# Patient Record
Sex: Female | Born: 1955 | Race: Black or African American | Hispanic: No | Smoking: Never smoker
Health system: Southern US, Community
[De-identification: ages and names within clinical notes are randomized; demographics above are authoritative.]

## PROBLEM LIST (undated history)

## (undated) DIAGNOSIS — M8580 Other specified disorders of bone density and structure, unspecified site: Secondary | ICD-10-CM

## (undated) DIAGNOSIS — R7303 Prediabetes: Secondary | ICD-10-CM

## (undated) DIAGNOSIS — D509 Iron deficiency anemia, unspecified: Secondary | ICD-10-CM

## (undated) DIAGNOSIS — D649 Anemia, unspecified: Secondary | ICD-10-CM

## (undated) DIAGNOSIS — E7800 Pure hypercholesterolemia, unspecified: Secondary | ICD-10-CM

## (undated) DIAGNOSIS — E079 Disorder of thyroid, unspecified: Secondary | ICD-10-CM

## (undated) DIAGNOSIS — K219 Gastro-esophageal reflux disease without esophagitis: Secondary | ICD-10-CM

## (undated) DIAGNOSIS — J45909 Unspecified asthma, uncomplicated: Secondary | ICD-10-CM

## (undated) DIAGNOSIS — Z8719 Personal history of other diseases of the digestive system: Secondary | ICD-10-CM

## (undated) DIAGNOSIS — T7840XA Allergy, unspecified, initial encounter: Secondary | ICD-10-CM

## (undated) DIAGNOSIS — R29818 Other symptoms and signs involving the nervous system: Secondary | ICD-10-CM

## (undated) DIAGNOSIS — J189 Pneumonia, unspecified organism: Secondary | ICD-10-CM

## (undated) DIAGNOSIS — E039 Hypothyroidism, unspecified: Secondary | ICD-10-CM

## (undated) HISTORY — DX: Disorder of thyroid, unspecified: E07.9

## (undated) HISTORY — DX: Unspecified asthma, uncomplicated: J45.909

## (undated) HISTORY — PX: WISDOM TOOTH EXTRACTION: SHX21

## (undated) HISTORY — PX: TUBAL LIGATION: SHX77

## (undated) HISTORY — DX: Anemia, unspecified: D64.9

## (undated) HISTORY — DX: Allergy, unspecified, initial encounter: T78.40XA

---
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
## 1980-10-28 HISTORY — PX: THYROIDECTOMY, PARTIAL: SHX18

## 1980-10-28 HISTORY — PX: PARATHYROIDECTOMY: SHX19

## 1980-10-28 HISTORY — PX: OTHER SURGICAL HISTORY: SHX169

## 2011-03-06 LAB — HM HEPATITIS C SCREENING LAB: HM HEPATITIS C SCREENING: NEGATIVE

## 2014-06-17 LAB — LIPID PANEL
Cholesterol: 256 — AB (ref 0–200)
HDL: 70 (ref 35–70)
LDL Cholesterol: 172
TRIGLYCERIDES: 72 (ref 40–160)

## 2014-06-17 LAB — TSH: TSH: 2.6 (ref 0.41–5.90)

## 2014-06-17 LAB — HEMOGLOBIN A1C: Hemoglobin A1C: 6.2

## 2014-09-29 LAB — HEMOGLOBIN A1C: HEMOGLOBIN A1C: 6.2

## 2014-09-29 LAB — LIPID PANEL
Cholesterol: 207 — AB (ref 0–200)
HDL: 59 (ref 35–70)
LDL Cholesterol: 133
Triglycerides: 73 (ref 40–160)

## 2014-09-29 LAB — CBC AND DIFFERENTIAL
HCT: 38 (ref 36–46)
HEMOGLOBIN: 12.8 (ref 12.0–16.0)
Platelets: 277 (ref 150–399)
WBC: 5.1

## 2014-09-29 LAB — TSH: TSH: 0.11 — AB (ref 0.41–5.90)

## 2014-09-29 LAB — HEPATIC FUNCTION PANEL
AST: 18 (ref 13–35)
Alkaline Phosphatase: 85 (ref 25–125)

## 2014-09-29 LAB — BASIC METABOLIC PANEL
BUN: 14 (ref 4–21)
CREATININE: 0.9 (ref 0.5–1.1)
GLUCOSE: 95
POTASSIUM: 4.7 (ref 3.4–5.3)
SODIUM: 140 (ref 137–147)

## 2014-09-29 LAB — COMPREHENSIVE METABOLIC PANEL
FREE T4: 1.4
URIC ACID: 5

## 2018-05-29 ENCOUNTER — Encounter: Payer: Self-pay | Admitting: Family Medicine

## 2018-05-29 ENCOUNTER — Ambulatory Visit: Payer: BLUE CROSS/BLUE SHIELD | Admitting: Family Medicine

## 2018-05-29 VITALS — BP 124/80 | HR 68 | Resp 16 | Ht 67.0 in | Wt 230.6 lb

## 2018-05-29 DIAGNOSIS — H9202 Otalgia, left ear: Secondary | ICD-10-CM

## 2018-05-29 DIAGNOSIS — E039 Hypothyroidism, unspecified: Secondary | ICD-10-CM

## 2018-05-29 DIAGNOSIS — H6123 Impacted cerumen, bilateral: Secondary | ICD-10-CM

## 2018-05-29 DIAGNOSIS — R29818 Other symptoms and signs involving the nervous system: Secondary | ICD-10-CM | POA: Insufficient documentation

## 2018-05-29 DIAGNOSIS — E669 Obesity, unspecified: Secondary | ICD-10-CM

## 2018-05-29 DIAGNOSIS — J302 Other seasonal allergic rhinitis: Secondary | ICD-10-CM

## 2018-05-29 DIAGNOSIS — R7309 Other abnormal glucose: Secondary | ICD-10-CM

## 2018-05-29 DIAGNOSIS — Z7689 Persons encountering health services in other specified circumstances: Secondary | ICD-10-CM | POA: Diagnosis not present

## 2018-05-29 DIAGNOSIS — K219 Gastro-esophageal reflux disease without esophagitis: Secondary | ICD-10-CM

## 2018-05-29 MED ORDER — MONTELUKAST SODIUM 10 MG PO TABS: 10.0000 mg | ORAL_TABLET | Freq: Every evening | ORAL | 3 refills | Status: DC | PRN

## 2018-05-29 MED ORDER — SYNTHROID 175 MCG PO TABS: 175.0000 ug | ORAL_TABLET | Freq: Every day | ORAL | 3 refills | Status: DC

## 2018-05-29 NOTE — Patient Instructions (Addendum)
Thank you for coming to the office today.  Referral to [facility] - stay tuned  Start back on Synthroid 175mcg daily - will re-check labs in 3 months  See if you can find records for routine screen Hep C and HIV - will need a copy or Let me know before blood draw and we can check this test as well  Will arrange colonoscopy in future.  You have thick impacted ear wax (cerumen) blocking ear canals and ear drums.  Try the over the counter Debrox (Carbamide peroxide), use on both sides following instructions on bottle   Can use a bulb syringe or something similar to flush wax out  Avoid using Q-tips inside ears, as this can push wax deeper, but you can try to use rolled up kleenex as a wick to absorb fluid and wax as well.  If you are not making progress with ear wax removal at home, or the problem keeps coming back, please notify our office or return for re-evaluation, and we can discuss referral to ENT office for more formal ear wax removal.   DUE for FASTING BLOOD WORK (no food or drink after midnight before the lab appointment, only water or coffee without cream/sugar on the morning of)  SCHEDULE "Lab Only" visit in the morning at the clinic for lab draw in 3 MONTHS   - Make sure Lab Only appointment is at about 1 week before your next appointment, so that results will be available  For Lab Results, once available within 2-3 days of blood draw, you can can log in to MyChart online to view your results and a brief explanation. Also, we can discuss results at next follow-up visit.   Please schedule a Follow-up Appointment to: Return in about 3 months (around 08/29/2018) for Annual Physical.  If you have any other questions or concerns, please feel free to call the office or send a message through MyChart. You may also schedule an earlier appointment if necessary.  Additionally, you may be receiving a survey about your experience at our office within a few days to 1 week  by e-mail or mail. We value your feedback.  Saralyn PilarAlexander Ladarion Munyon, DO [facility]

## 2018-05-29 NOTE — Progress Notes (Addendum)
Subjective:    Patient ID: Amanda Pierce, female    DOB: May 21, 1956, 62 y.o.   MRN: [id number]  Ambree Pierce is a 62 y.o. female presenting on 05/29/2018 for Establish Care; Hypothyroidism (need refill and has been out of medication); and Ear Pain (left ear since dental work)  From [place], and moved to [facility] about 8 years ago. Previous PCP located in [place] apparently was incarcerated and their office was closed. Tried to locate any of her records from prior PCP, since they closed, but only has access to some lab results, no other records.  HPI   Hypothyroidism - History of thyroidectomy 07/1981 (after diagnosed nodules) was not started on thyroid hormone for 5 years after, dx with suppressed goiter, prior dosage adjustment in past, has been on Levothyroxine for while. Now over past 3-4 months has been out of medicine, only has very small # of pills left and taking intermittently, need refill and lab re-check eventually. Last TSH from records available is 2015.  GERD / Hiatal Hernia Reports chronic problem in past, with prior dx hiatal hernia on EGD. She was treated with PPI. Then transitioned to Pepcid has done well and ultimately this was healed  - Previously on pepcid, and now this is healed.  Dental Work Recently had a crown placed, that needed to be revised. She had some pain from this seemed to radiate to her L ear asking to get this checked out today.  Suspected Sleep Apnea Reports concern for sleep apnea with history of habitual snoring and feeling tired and sleepiness during daytime. Also witnessed sleep apnea by her report. She has never had PSG before. She admits symptoms worse with some weight gain. See scores below.  Epworth Sleepiness Scale Total Score: 13 Sitting and reading - 2 Watching TV - 3 Sitting inactive in a public place - 1 As a passenger in a car for an hour without a break - 3 Lying down to rest in the afternoon when circumstances permit -  2 Sitting and talking to someone - 1 Sitting quietly after a lunch without alcohol - 1 In a car, while stopped for a few minutes in traffic - 0  STOP-Bang OSA scoring Snoring yes   Tiredness yes   Observed apneas yes   Pressure HTN no   BMI > 35 kg/m2 yes   Age > 50  yes   Neck (female >17 in; Female >16 in)  no  15.5"  Gender female no   OSA risk low (0-2)  OSA risk intermediate (3-4)  OSA risk high (5+)  Total: 5 (High Risk)   Additional social history: - She works as a Merchant navy officer. Her husband is a Programmer, multimedia, husband is retired Dealer She has 4 children, 2 grand, and 3 great grand - Enjoys crafts, hobbies  Health Maintenance:  Colon CA Screening: Last Colon[facility]  ([facility] results with negative without polyps, good for 5 years, this was her 3rd colonoscopy, do not have access to the record. Currently asymptomatic except some chronic mild constipation. No known family history of colon CA. Due for screening test in 08/2018 will need new referral to GI for colonoscopy   Husband has Hepatitis C, chronic - treated at [place]. He was cured after 2 attempts of medication. She has been tested before and was negative, will request record   Depression screen [facility] 2/9 05/29/2018  Decreased Interest 0  Down, Depressed, Hopeless 0  PHQ - 2  Score 0    Past Medical History:  Diagnosis Date  . Allergy   . Anemia   . Asthma    History reviewed. No pertinent surgical history. Social History   Socioeconomic History  . Marital status: Married    Spouse name: Not on file  . Number of children: Not on file  . Years of education: Not on file  . Highest education level: Not on file  Occupational History  . Not on file  Social Needs  . Financial resource strain: Not on file  . Food insecurity:    Worry: Not on file    Inability: Not on file  . Transportation needs:    Medical: Not on file    Non-medical: Not on file  Tobacco Use  . Smoking status:  Never Smoker  . Smokeless tobacco: Never Used  Substance and Sexual Activity  . Alcohol use: Never    Frequency: Never  . Drug use: Never  . Sexual activity: Not on file  Lifestyle  . Physical activity:    Days per week: Not on file    Minutes per session: Not on file  . Stress: Not on file  Relationships  . Social connections:    Talks on phone: Not on file    Gets together: Not on file    Attends religious service: Not on file    Active member of club or organization: Not on file    Attends meetings of clubs or organizations: Not on file    Relationship status: Not on file  . Intimate partner violence:    Fear of current or ex partner: Not on file    Emotionally abused: Not on file    Physically abused: Not on file    Forced sexual activity: Not on file  Other Topics Concern  . Not on file  Social History Narrative  . Not on file   Family History  Problem Relation Age of Onset  . Cancer Mother        Breast, Paget's removed nipple 72, breast 2001  . Heart disease Father   . Colon cancer Neg Hx    No current outpatient medications on file prior to visit.   No current facility-administered medications on file prior to visit.     Review of Systems Per HPI unless specifically indicated above     Objective:    BP 124/80 (BP Location: Left Arm, Patient Position: Sitting, Cuff Size: Large)   Pulse 68   Resp 16   Ht 5\' 7"  (1.702 m)   Wt 230 lb 9.6 oz (104.6 kg)   BMI 36.12 kg/m   Wt Readings from Last 3 Encounters:  05/29/18 230 lb 9.6 oz (104.6 kg)    Physical Exam  Constitutional: She is oriented to person, place, and time. She appears well-developed and well-nourished. No distress.  Well-appearing, comfortable, cooperative, obese  HENT:  Head: Normocephalic and atraumatic.  Mouth/Throat: Oropharynx is clear and moist.  Frontal / maxillary sinuses non-tender. Bilateral ears with moderate amount of soft non impacted cerumen - but TMs visible without  erythema, effusion or bulging. No tragus tender. No mastoid tender. Oropharynx clear without erythema, exudates, edema or asymmetry.  Eyes: Conjunctivae are normal. Right eye exhibits no discharge. Left eye exhibits no discharge.  Neck: Normal range of motion. Neck supple. No thyromegaly present.  No thyroid abnormality or nodules  Cardiovascular: Normal rate, regular rhythm, normal heart sounds and intact distal pulses.  No murmur heard. Pulmonary/Chest: Effort normal  and breath sounds normal. No respiratory distress. She has no wheezes. She has no rales.  Musculoskeletal: Normal range of motion. She exhibits no edema.  Lymphadenopathy:    She has no cervical adenopathy.  Neurological: She is alert and oriented to person, place, and time.  Skin: Skin is warm and dry. No rash noted. She is not diaphoretic. No erythema.  Psychiatric: She has a normal mood and affect. Her behavior is normal.  Well groomed, good eye contact, normal speech and thoughts  Nursing note and vitals reviewed.  Results for orders placed or performed in visit on 05/29/18  CBC and differential  Result Value Ref Range   Hemoglobin 12.8 12.0 - 16.0   HCT 38 36 - 46   Platelets 277 150 - 399   WBC 5.1   Basic metabolic panel  Result Value Ref Range   Glucose 95    BUN 14 4 - 21   Creatinine 0.9 0.5 - 1.1   Potassium 4.7 3.4 - 5.3   Sodium 140 137 - 147  Lipid panel  Result Value Ref Range   Triglycerides 73 40 - 160   Cholesterol 207 (A) 0 - 200   HDL 59 35 - 70   LDL Cholesterol 133   Hepatic function panel  Result Value Ref Range   Alkaline Phosphatase 85 25 - 125   AST 18 13 - 35  Hemoglobin A1c  Result Value Ref Range   Hemoglobin A1C 6.2   TSH  Result Value Ref Range   TSH 0.11 (A) 0.41 - 5.90  Comprehensive metabolic panel  Result Value Ref Range   Free T4 1.4    Uric Acid 5   Lipid panel  Result Value Ref Range   Triglycerides 72 40 - 160   Cholesterol 256 (A) 0 - 200   HDL 70 35 - 70    LDL Cholesterol 172   Hemoglobin A1c  Result Value Ref Range   Hemoglobin A1C 6.2   TSH  Result Value Ref Range   TSH 2.60 0.41 - 5.90      Assessment & Plan:   Problem List Items Addressed This Visit    Elevated hemoglobin A1c    Prior readings elevated 6.2 in 2015 Due for repeat with upcoming labs      Gastroesophageal reflux disease without esophagitis    Controlled with lifestyle modification Off PPI or H2 currently Prior history of hiatal hernia and worse problem Follow-up if worse      Hypothyroidism (acquired) - Primary    Chronic issue post-surgical thyroidectomy due to nodules/goiter Previously on Synthroid brand 175mcg daily - ran out and due for refill and future re-check lab - Old labs received dated soonest was 2015      Relevant Medications   SYNTHROID 175 MCG tablet   Obesity (BMI 35.0-39.9 without comorbidity)    Encourage lifestyle modification      Suspected sleep apnea    Persistent clinical concern for suspected obstructive sleep apnea given reported symptoms with witnessed apnea, snoring and sleep disturbance, tired, excessive sleepiness. - Screening: ESS score 13 / STOP-Bang Score 5 = high risk - Neck Circumference: 15.5"  Plan: 1. Discussion on initial diagnosis and testing for OSA, risk factors, management, complications 2. Agree to proceed with sleep study testing based on clinical concerns - advised patient that [facility] and they will contact her to arrange home vs sleep center and schedule  Other Visit Diagnoses    Encounter to establish care with new doctor       Left ear pain       Bilateral impacted cerumen       Seasonal allergies       Relevant Medications   montelukast (SINGULAIR) 10 MG tablet      Meds ordered this encounter  Medications  . SYNTHROID 175 MCG tablet    Sig: Take 1 tablet (175 mcg total) by mouth daily before breakfast.    Dispense:  100 tablet    Refill:  3  .  montelukast (SINGULAIR) 10 MG tablet    Sig: Take 1 tablet (10 mg total) by mouth at bedtime as needed.    Dispense:  90 tablet    Refill:  3    Follow up plan: Return in about 3 months (around 08/29/2018) for Annual Physical.  Future lab orders 08/24/18  Saralyn Pilar, DO [facility] 05/30/2018, 10:57 AM

## 2018-05-30 ENCOUNTER — Other Ambulatory Visit: Payer: Self-pay | Admitting: Family Medicine

## 2018-05-30 ENCOUNTER — Encounter: Payer: Self-pay | Admitting: Family Medicine

## 2018-05-30 DIAGNOSIS — K219 Gastro-esophageal reflux disease without esophagitis: Secondary | ICD-10-CM

## 2018-05-30 DIAGNOSIS — E7800 Pure hypercholesterolemia, unspecified: Secondary | ICD-10-CM

## 2018-05-30 DIAGNOSIS — Z0000 Encounter for general adult medical examination without abnormal findings: Secondary | ICD-10-CM

## 2018-05-30 DIAGNOSIS — E039 Hypothyroidism, unspecified: Secondary | ICD-10-CM

## 2018-05-30 DIAGNOSIS — E669 Obesity, unspecified: Secondary | ICD-10-CM

## 2018-05-30 DIAGNOSIS — R29818 Other symptoms and signs involving the nervous system: Secondary | ICD-10-CM

## 2018-05-30 DIAGNOSIS — R7309 Other abnormal glucose: Secondary | ICD-10-CM

## 2018-05-30 DIAGNOSIS — R7303 Prediabetes: Secondary | ICD-10-CM | POA: Insufficient documentation

## 2018-05-30 NOTE — Assessment & Plan Note (Signed)
Prior readings elevated 6.2 in 2015 Due for repeat with upcoming labs

## 2018-05-30 NOTE — Assessment & Plan Note (Signed)
Persistent clinical concern for suspected obstructive sleep apnea given reported symptoms with witnessed apnea, snoring and sleep disturbance, tired, excessive sleepiness. - Screening: ESS score 13 / STOP-Bang Score 5 = high risk - Neck Circumference: 15.5"  Plan: 1. Discussion on initial diagnosis and testing for OSA, risk factors, management, complications 2. Agree to proceed with sleep study testing based on clinical concerns - advised patient that we will [facility] and they will contact her to arrange home vs sleep center and schedule

## 2018-05-30 NOTE — Assessment & Plan Note (Signed)
Controlled with lifestyle modification Off PPI or H2 currently Prior history of hiatal hernia and worse problem Follow-up if worse

## 2018-05-30 NOTE — Assessment & Plan Note (Signed)
Encourage life style modification

## 2018-05-30 NOTE — Assessment & Plan Note (Signed)
Chronic issue post-surgical thyroidectomy due to nodules/goiter Previously on Synthroid brand 175mcg daily - ran out and due for refill and future re-check lab - Old labs received dated soonest was [id number]

## 2018-05-31 ENCOUNTER — Encounter: Payer: Self-pay | Admitting: Family Medicine

## 2018-06-05 ENCOUNTER — Encounter: Payer: Self-pay | Admitting: Family Medicine

## 2018-08-24 ENCOUNTER — Other Ambulatory Visit: Payer: BLUE CROSS/BLUE SHIELD

## 2018-08-24 DIAGNOSIS — E669 Obesity, unspecified: Secondary | ICD-10-CM

## 2018-08-24 DIAGNOSIS — K219 Gastro-esophageal reflux disease without esophagitis: Secondary | ICD-10-CM

## 2018-08-24 DIAGNOSIS — E7800 Pure hypercholesterolemia, unspecified: Secondary | ICD-10-CM

## 2018-08-24 DIAGNOSIS — R7309 Other abnormal glucose: Secondary | ICD-10-CM

## 2018-08-24 DIAGNOSIS — Z0000 Encounter for general adult medical examination without abnormal findings: Secondary | ICD-10-CM

## 2018-08-24 DIAGNOSIS — E039 Hypothyroidism, unspecified: Secondary | ICD-10-CM

## 2018-08-25 LAB — COMPLETE METABOLIC PANEL WITH GFR
AG RATIO: 1.2 (calc) (ref 1.0–2.5)
ALKALINE PHOSPHATASE (APISO): 81 U/L (ref 33–130)
ALT: 9 U/L (ref 6–29)
AST: 17 U/L (ref 10–35)
Albumin: 3.7 g/dL (ref 3.6–5.1)
BILIRUBIN TOTAL: 0.4 mg/dL (ref 0.2–1.2)
BUN: 12 mg/dL (ref 7–25)
CHLORIDE: 107 mmol/L (ref 98–110)
CO2: 32 mmol/L (ref 20–32)
Calcium: 9 mg/dL (ref 8.6–10.4)
Creat: 0.81 mg/dL (ref 0.50–0.99)
GFR, EST NON AFRICAN AMERICAN: 77 mL/min/{1.73_m2} (ref >=60)
GFR, Est African American: 89 mL/min/{1.73_m2} (ref >=60)
GLOBULIN: 3.2 g/dL (ref 1.9–3.7)
Glucose, Bld: 105 mg/dL — ABNORMAL HIGH (ref 65–99)
Potassium: 4.2 mmol/L (ref 3.5–5.3)
SODIUM: 143 mmol/L (ref 135–146)
Total Protein: 6.9 g/dL (ref 6.1–8.1)

## 2018-08-25 LAB — CBC WITH DIFFERENTIAL/PLATELET
BASOS PCT: 0.2 %
Basophils Absolute: 11 cells/uL (ref 0–200)
EOS ABS: 39 {cells}/uL (ref 15–500)
Eosinophils Relative: 0.7 %
HEMATOCRIT: 37.6 % (ref 35.0–45.0)
HEMOGLOBIN: 11.7 g/dL (ref 11.7–15.5)
LYMPHS ABS: 2156 {cells}/uL (ref 850–3900)
MCH: 25.2 pg — AB (ref 27.0–33.0)
MCHC: 31.1 g/dL — ABNORMAL LOW (ref 32.0–36.0)
MCV: 80.9 fL (ref 80.0–100.0)
MPV: 10.5 fL (ref 7.5–12.5)
Monocytes Relative: 6.8 %
NEUTROS ABS: 3013 {cells}/uL (ref 1500–7800)
Neutrophils Relative %: 53.8 %
PLATELETS: 313 10*3/uL (ref 140–400)
RBC: 4.65 10*6/uL (ref 3.80–5.10)
RDW: 14.8 % (ref 11.0–15.0)
TOTAL LYMPHOCYTE: 38.5 %
WBC: 5.6 10*3/uL (ref 3.8–10.8)
WBCMIX: 381 {cells}/uL (ref 200–950)

## 2018-08-25 LAB — LIPID PANEL
CHOL/HDL RATIO: 3.8 (calc) (ref <5.0)
CHOLESTEROL: 210 mg/dL — AB (ref <200)
HDL: 55 mg/dL (ref >50)
LDL CHOLESTEROL (CALC): 137 mg/dL — AB
Non-HDL Cholesterol (Calc): 155 mg/dL (calc) — ABNORMAL HIGH (ref <130)
Triglycerides: 84 mg/dL (ref <150)

## 2018-08-25 LAB — TSH: TSH: 0.16 mIU/L — ABNORMAL LOW (ref 0.40–4.50)

## 2018-08-25 LAB — HEMOGLOBIN A1C
HEMOGLOBIN A1C: 6.3 %{Hb} — AB (ref <5.7)
MEAN PLASMA GLUCOSE: 134 (calc)
eAG (mmol/L): 7.4 (calc)

## 2018-08-25 LAB — T4, FREE: Free T4: 1.2 ng/dL (ref 0.8–1.8)

## 2018-08-26 ENCOUNTER — Encounter: Payer: Self-pay | Admitting: Family Medicine

## 2018-08-28 ENCOUNTER — Encounter: Payer: Self-pay | Admitting: Family Medicine

## 2018-08-28 ENCOUNTER — Other Ambulatory Visit: Payer: Self-pay | Admitting: Family Medicine

## 2018-08-28 ENCOUNTER — Ambulatory Visit (INDEPENDENT_AMBULATORY_CARE_PROVIDER_SITE_OTHER): Payer: BLUE CROSS/BLUE SHIELD | Admitting: Family Medicine

## 2018-08-28 VITALS — BP 134/80 | HR 64 | Temp 98.3°F | Resp 16 | Ht 68.0 in | Wt 226.0 lb

## 2018-08-28 DIAGNOSIS — K219 Gastro-esophageal reflux disease without esophagitis: Secondary | ICD-10-CM

## 2018-08-28 DIAGNOSIS — Z23 Encounter for immunization: Secondary | ICD-10-CM

## 2018-08-28 DIAGNOSIS — Z0000 Encounter for general adult medical examination without abnormal findings: Secondary | ICD-10-CM | POA: Diagnosis not present

## 2018-08-28 DIAGNOSIS — E669 Obesity, unspecified: Secondary | ICD-10-CM

## 2018-08-28 DIAGNOSIS — R29818 Other symptoms and signs involving the nervous system: Secondary | ICD-10-CM

## 2018-08-28 DIAGNOSIS — K449 Diaphragmatic hernia without obstruction or gangrene: Secondary | ICD-10-CM

## 2018-08-28 DIAGNOSIS — E039 Hypothyroidism, unspecified: Secondary | ICD-10-CM | POA: Diagnosis not present

## 2018-08-28 DIAGNOSIS — E559 Vitamin D deficiency, unspecified: Secondary | ICD-10-CM

## 2018-08-28 DIAGNOSIS — E7800 Pure hypercholesterolemia, unspecified: Secondary | ICD-10-CM | POA: Diagnosis not present

## 2018-08-28 DIAGNOSIS — Z1211 Encounter for screening for malignant neoplasm of colon: Secondary | ICD-10-CM

## 2018-08-28 DIAGNOSIS — R7303 Prediabetes: Secondary | ICD-10-CM

## 2018-08-28 DIAGNOSIS — E538 Deficiency of other specified B group vitamins: Secondary | ICD-10-CM

## 2018-08-28 DIAGNOSIS — H6123 Impacted cerumen, bilateral: Secondary | ICD-10-CM

## 2018-08-28 DIAGNOSIS — D509 Iron deficiency anemia, unspecified: Secondary | ICD-10-CM

## 2018-08-28 MED ORDER — SYNTHROID 175 MCG PO TABS: 175.0000 ug | ORAL_TABLET | Freq: Every day | ORAL | 5 refills | Status: DC

## 2018-08-28 NOTE — Assessment & Plan Note (Signed)
Controlled with lifestyle modification Off PPI or H2 currently Prior history of hiatal hernia and worse problem  Referral to [facility] for routine screening colonoscopy - also patient requesting repeat endoscopy, as advised by GI for surveillance

## 2018-08-28 NOTE — Progress Notes (Signed)
Subjective:    Patient ID: Amanda Pierce, female    DOB: 08-27-1956, 62 y.o.   MRN: [id number]  Amanda Pierce is a 62 y.o. female presenting on 08/28/2018 for Annual Exam   HPI   Here for Annual Physical and Lab Review.  FOLLOW-UP Hypothyroidism - Last visit with me 05/29/18, for initial visit, treated with restarted prior Synthroid dose of levothyroxine 147mg daily as she had been off for past 3-4 months, see prior notes for background information. - Update - labs show slightly low TSH 0.16, normal Free T4 - Today patient reports she feels fine back on the medicine. Taking daily, not missed any doses. - She has no new complaints of symptoms at this time, does not feel hyperthyroid, she request to stay on same dose has been on this for years - Asking for 30 day supply due to cost  History of Anemia Long history of prior anemia, reported in past with required iron treatment. No prior labs with anemia, however he recent lab shows Hgb 11.7 borderline low. She also has history of Vitamin B12 deficiency, in past she received B12 injections for energy. - Admits feels a little tired  HYPERLIPIDEMIA: - Reports no concerns. Last lipid panel 07/2018, mild elevated LDL Not on statin  Additional question - She has some spots of vitiligo on extremities, small areas of hypopigmentation, asking about testing for Lupus. She states fam member diagnosed w/ lupus in their 30-40s.  PreDM Prior A1c 6.2 few years ago. Now recent lab 6.3 Meds: not on  medication Lifestyle: - Diet (balanced, admits needs to improve diet, interested in low carb)  Denies hypoglycemia  Bilateral Cerumen Ear Impaction Previous visit she had similar complaint with thick ear wax. She used OTC debrox drops but unable to resolve the cerumen. Now requesting flushing. Admits some pressure within ears but without pain or hearing loss.  GERD / Hiatal Hernia Reports chronic problem in past, with prior dx hiatal hernia on EGD. She  was treated with PPI. Then transitioned to Pepcid has done well and ultimately this was healed - She is asking about referral to GI for repeat updated colonoscopy / endoscopy  Suspected Sleep Apnea Last visit PSG was ordered after discussion of concern for OSA. See prior note - brief update today is that she cannot afford it because her deductible has not been met, she will need to attempt to contact them to re run the test in 2020  Below are copied scores from her screening at last visit Epworth Sleepiness Scale Total Score: 13 Sitting and reading - 2 Watching TV - 3 Sitting inactive in a public place - 1 As a passenger in a car for an hour without a break - 3 Lying down to rest in the afternoon when circumstances permit - 2 Sitting and talking to someone - 1 Sitting quietly after a lunch without alcohol - 1 In a car, while stopped for a few minutes in traffic - 0  STOP-Bang OSA scoring Snoring yes   Tiredness yes   Observed apneas yes   Pressure HTN no   BMI > 35 kg/m2 yes   Age > 567 yes   Neck (female >17 in; Female >16 in)  no  15.5"  Gender female no   OSA risk low (0-2)  OSA risk intermediate (3-4)  OSA risk high (5+)  Total: 5 (High Risk)    Health Maintenance:  Tdap today  [facility]- awaiting  results Mammo 07/2018 / DEXA - she will have them fax [place] results.  Colon CA Screening: Last Colonoscopy  (done [facility]), results with negative without polyps, good for 5 years, this was her 3rd colonoscopy, do not have access to the record. Currently asymptomatic except some chronic mild constipation. No known family history of colon CA. Due for screening test in 08/2018 will need new referral to GI for colonoscopy   Husband has Hepatitis C, chronic - treated at [place]. He was cured after 2 attempts of medication. She has been tested before and was negative, will request record   Depression screen [facility] 2/9 08/28/2018 05/29/2018    Decreased Interest 0 0  Down, Depressed, Hopeless 0 0  PHQ - 2 Score 0 0    Past Medical History:  Diagnosis Date  . Allergy   . Anemia   . Asthma    Past Surgical History:  Procedure Laterality Date  . BREAST MASS EXCISION Left 1974   benign  . PARATHYROIDECTOMY  1982   Social History   Socioeconomic History  . Marital status: Married    Spouse name: Not on file  . Number of children: Not on file  . Years of education: Not on file  . Highest education level: Not on file  Occupational History  . Not on file  Social Needs  . Financial resource strain: Not on file  . Food insecurity:    Worry: Not on file    Inability: Not on file  . Transportation needs:    Medical: Not on file    Non-medical: Not on file  Tobacco Use  . Smoking status: Never Smoker  . Smokeless tobacco: Never Used  Substance and Sexual Activity  . Alcohol use: Never    Frequency: Never  . Drug use: Never  . Sexual activity: Not on file  Lifestyle  . Physical activity:    Days per week: Not on file    Minutes per session: Not on file  . Stress: Not on file  Relationships  . Social connections:    Talks on phone: Not on file    Gets together: Not on file    Attends religious service: Not on file    Active member of club or organization: Not on file    Attends meetings of clubs or organizations: Not on file    Relationship status: Not on file  . Intimate partner violence:    Fear of current or ex partner: Not on file    Emotionally abused: Not on file    Physically abused: Not on file    Forced sexual activity: Not on file  Other Topics Concern  . Not on file  Social History Narrative  . Not on file   Family History  Problem Relation Age of Onset  . Cancer Mother        Breast, Paget's removed nipple 32, breast 2001  . Colon polyps Mother   . Heart disease Father   . Heart attack Father   . Heart disease Maternal Grandfather   . Stroke Paternal Grandmother   . Colon cancer Neg  Hx    Current Outpatient Medications on File Prior to Visit  Medication Sig  . montelukast (SINGULAIR) 10 MG tablet Take 1 tablet (10 mg total) by mouth at bedtime as needed.   No current facility-administered medications on file prior to visit.     Review of Systems  Constitutional: Negative for activity change, appetite change,  chills, diaphoresis, fatigue and fever.  HENT: Negative for congestion and hearing loss.        Ear wax  Eyes: Negative for visual disturbance.  Respiratory: Negative for apnea, cough, choking, chest tightness, shortness of breath and wheezing.   Cardiovascular: Negative for chest pain, palpitations and leg swelling.  Gastrointestinal: Negative for abdominal pain, anal bleeding, blood in stool, constipation, diarrhea, nausea and vomiting.  Endocrine: Negative for cold intolerance.  Genitourinary: Negative for difficulty urinating, dysuria, frequency and hematuria.  Musculoskeletal: Negative for arthralgias, back pain and neck pain.  Skin: Negative for rash.  Allergic/Immunologic: Negative for environmental allergies.  Neurological: Negative for dizziness, weakness, light-headedness, numbness and headaches.  Hematological: Negative for adenopathy.  Psychiatric/Behavioral: Negative for behavioral problems, dysphoric mood and sleep disturbance. The patient is not nervous/anxious.    Per HPI unless specifically indicated above     Objective:    BP 134/80 (BP Location: Left Arm, Cuff Size: Normal)   Pulse 64   Temp 98.3 F (36.8 C) (Oral)   Resp 16   Ht 5' 8"  (1.727 m)   Wt 226 lb (102.5 kg)   BMI 34.36 kg/m   Wt Readings from Last 3 Encounters:  08/28/18 226 lb (102.5 kg)  05/29/18 230 lb 9.6 oz (104.6 kg)    Physical Exam  Constitutional: She is oriented to person, place, and time. She appears well-developed and well-nourished. No distress.  Well-appearing, comfortable, cooperative  HENT:  Head: Normocephalic and atraumatic.  Mouth/Throat:  Oropharynx is clear and moist.  Frontal / maxillary sinuses non-tender. Nares patent without purulence or edema. Oropharynx clear without erythema, exudates, edema or asymmetry.  Oral cavity with roof of mouth L side of upper soft palate with 1 cm soft raised smooth nodular mass, without erythema or ulceration. Stable known problem from her dentist.  Bilateral ears with significant cerumen impaction.  Eyes: Pupils are equal, round, and reactive to light. Conjunctivae and EOM are normal. Right eye exhibits no discharge. Left eye exhibits no discharge.  Neck: Normal range of motion. Neck supple. No thyromegaly present.  Cardiovascular: Normal rate, regular rhythm, normal heart sounds and intact distal pulses.  No murmur heard. Pulmonary/Chest: Effort normal and breath sounds normal. No respiratory distress. She has no wheezes. She has no rales.  Abdominal: Soft. Bowel sounds are normal. She exhibits no distension and no mass. There is no tenderness.  Musculoskeletal: Normal range of motion. She exhibits no edema or tenderness.  Upper / Lower Extremities: - Normal muscle tone, strength bilateral upper extremities 5/5, lower extremities 5/5  Lymphadenopathy:    She has no cervical adenopathy.  Neurological: She is alert and oriented to person, place, and time.  Distal sensation intact to light touch all extremities  Skin: Skin is warm and dry. No rash noted. She is not diaphoretic. No erythema.  Psychiatric: She has a normal mood and affect. Her behavior is normal.  Well groomed, good eye contact, normal speech and thoughts  Nursing note and vitals reviewed.    ________________________________________________________ PROCEDURE NOTE Date: 08/28/18 Bilateral Ear Lavage / Cerumen Removal Discussed benefits and risks (including pain / discomforts, dizziness, minor abrasion of ear canal). Verbal consent given by patient. Medication:  carbamide peroxide ear drops, Ear Lavage Solution (warm water  + hydrogen peroxide) Performed by Dr Parks Ranger / Frederich Cha CMA Several drops of carbamide peroxide placed in each ear, allowed to sit for few minutes. Ear lavage solution flushed into one ear at a time in attempt to dislodge and remove  ear wax. Results were successful eventually after lavage and manual curette removal. She had episode of dizziness during procedure, rested in office and it resolved.  Repeat Ear Exam: - Completely removed cerumen now, with clear ear canals and visible TMs clear and normal appearance.   Results for orders placed or performed in visit on 08/24/18  T4, free  Result Value Ref Range   Free T4 1.2 0.8 - 1.8 ng/dL  TSH  Result Value Ref Range   TSH 0.16 (L) 0.40 - 4.50 mIU/L  Lipid panel  Result Value Ref Range   Cholesterol 210 (H) <200 mg/dL   HDL 55 >50 mg/dL   Triglycerides 84 <150 mg/dL   LDL Cholesterol (Calc) 137 (H) mg/dL (calc)   Total CHOL/HDL Ratio 3.8 <5.0 (calc)   Non-HDL Cholesterol (Calc) 155 (H) <130 mg/dL (calc)  COMPLETE METABOLIC PANEL WITH GFR  Result Value Ref Range   Glucose, Bld 105 (H) 65 - 99 mg/dL   BUN 12 7 - 25 mg/dL   Creat 0.81 0.50 - 0.99 mg/dL   GFR, Est Non African American 77 > OR = 60 mL/min/1.21m   GFR, Est African American 89 > OR = 60 mL/min/1.756m  BUN/Creatinine Ratio NOT APPLICABLE 6 - 22 (calc)   Sodium 143 135 - 146 mmol/L   Potassium 4.2 3.5 - 5.3 mmol/L   Chloride 107 98 - 110 mmol/L   CO2 32 20 - 32 mmol/L   Calcium 9.0 8.6 - 10.4 mg/dL   Total Protein 6.9 6.1 - 8.1 g/dL   Albumin 3.7 3.6 - 5.1 g/dL   Globulin 3.2 1.9 - 3.7 g/dL (calc)   AG Ratio 1.2 1.0 - 2.5 (calc)   Total Bilirubin 0.4 0.2 - 1.2 mg/dL   Alkaline phosphatase (APISO) 81 33 - 130 U/L   AST 17 10 - 35 U/L   ALT 9 6 - 29 U/L  CBC with Differential/Platelet  Result Value Ref Range   WBC 5.6 3.8 - 10.8 Thousand/uL   RBC 4.65 3.80 - 5.10 Million/uL   Hemoglobin 11.7 11.7 - 15.5 g/dL   HCT 37.6 35.0 - 45.0 %   MCV 80.9 80.0 -  100.0 fL   MCH 25.2 (L) 27.0 - 33.0 pg   MCHC 31.1 (L) 32.0 - 36.0 g/dL   RDW 14.8 11.0 - 15.0 %   Platelets 313 140 - 400 Thousand/uL   MPV 10.5 7.5 - 12.5 fL   Neutro Abs 3,013 1,500 - 7,800 cells/uL   Lymphs Abs 2,156 850 - 3,900 cells/uL   WBC mixed population 381 200 - 950 cells/uL   Eosinophils Absolute 39 15 - 500 cells/uL   Basophils Absolute 11 0 - 200 cells/uL   Neutrophils Relative % 53.8 %   Total Lymphocyte 38.5 %   Monocytes Relative 6.8 %   Eosinophils Relative 0.7 %   Basophils Relative 0.2 %  Hemoglobin A1c  Result Value Ref Range   Hgb A1c MFr Bld 6.3 (H) <5.7 % of total Hgb   Mean Plasma Glucose 134 (calc)   eAG (mmol/L) 7.4 (calc)      Assessment & Plan:   Problem List Items Addressed This Visit    Gastroesophageal reflux disease without esophagitis    Controlled with lifestyle modification Off PPI or H2 currently Prior history of hiatal hernia and worse problem  Referral to [facility] for routine screening colonoscopy - also patient requesting repeat endoscopy, as advised by GI for surveillance      Relevant Orders  Ambulatory referral to Gastroenterology   Hypothyroidism (acquired)    Stable currently with slightly low TSH but normal Free T4 back on Synthroid 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 Not on any therapy currently  2. Encourage improved lifestyle - low carb, low sugar diet, reduce portion size, continue improving regular exercise - alrdy has wt loss 3. Follow-up 6 months PreDM  A1c       Pure hypercholesterolemia    Slightly elevated LDL, on improving lifestyle, not on statin Calculated ASCVD 10 yr risk score 7%  Plan: Encourage improved lifestyle - low carb/cholesterol, reduce portion size, continue improving regular exercise Follow-up yearly lipid - reviewed statins, deferred for now not indicated       Suspected sleep apnea    Persistent clinical concern for suspected obstructive sleep apnea given reported symptoms with witnessed apnea, snoring and sleep disturbance, tired, excessive sleepiness. - Screening: ESS score 13 / STOP-Bang Score 5 = high risk - Neck Circumference: 15.5"  Proceed with patient contacting Sleep Center in 2020 due to deductible could not run PSG this year. Clinically same as before meets criteria high risk for OSA       Other Visit Diagnoses    Annual physical exam    -  Primary Updated Health Maintenance information - TDap, Refer to GI, request mammo / dexa from GYN Reviewed recent lab results with patient Encouraged improvement to lifestyle with diet and exercise - Goal of weight loss    Screening for colon cancer       Relevant Orders   Ambulatory referral to Gastroenterology   Hiatal hernia       Relevant Orders   Ambulatory referral to Gastroenterology   Need for diphtheria-tetanus-pertussis (Tdap) vaccine       Relevant Orders   Tdap vaccine greater than or equal to 7yo IM (Completed)   Impacted cerumen of both ears          Significant amount of large thick impacted cerumen bilaterally ears uncomplicated  Plan: 1. Successful office ear lavage cerumen removal today, re-evaluated with clear ear canals and normal TMs 2. Counseled on avoiding Q-tips and may use Kleenex as wick, use OTC Debrox as needed 3. Follow-up as needed   Meds ordered this encounter  Medications  . SYNTHROID 175 MCG tablet    Sig: Take 1 tablet (175 mcg total) by mouth daily before breakfast.    Dispense:  30 tablet    Refill:  5     Quantity reduced from 90 to 30    Follow up plan: Return in about 6 months (around 02/26/2019) for Lab results / Anemia / Thyroid / PreDM.  Future labs 02/23/19 -  A1c, CBC, Anemia panel, TSH Free T4, Vitamin B12, Vitamin D  ANobie Putnam DO [facility] 08/28/2018, 3:07 PM

## 2018-08-28 NOTE — Assessment & Plan Note (Signed)
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

## 2018-08-28 NOTE — Patient Instructions (Addendum)
Thank you for coming to the office today.  A1c 6.3 - and LDL cholesterol slightly elevated - try to improve diet as planned, and more home cooked meals, low carb options  We will check iron and labs again for Thyroid again in 6 months.  Stay tuned for apt from GI - discuss both colonoscopy and endoscopy  [facility]) [address] - [address] [place], [place] [address] Phone: [phone]  Call [facility] again in 2020 - once ready to proceed. Let us know if you need a new order.  Will also check Vitamin B12 and D in 6 months  May take Vitamin B12 daily  Start OTC Vitamin D3 2,000 iu daily for maintenance   DUE for FASTING BLOOD WORK (no food or drink after midnight before the lab appointment, only water or coffee without cream/sugar on the morning of)  SCHEDULE "Lab Only" visit in the morning at the clinic for lab draw in 6 MONTHS   - Make sure Lab Only appointment is at about 1 week before your next appointment, so that results will be available  For Lab Results, once available within 2-3 days of blood draw, you can can log in to MyChart online to view your results and a brief explanation. Also, we can discuss results at next follow-up visit.   Please schedule a Follow-up Appointment to: Return in about 6 months (around 02/26/2019) for Lab results / Anemia / Thyroid / PreDM.  If you have any other questions or concerns, please feel free to call the office or send a message through MyChart. You may also schedule an earlier appointment if necessary.  Additionally, you may be receiving a survey about your experience at our office within a few days to 1 week by e-mail or mail. We value your feedback.  Saralyn Pilar, DO [facility], [place]

## 2018-08-28 NOTE — Assessment & Plan Note (Signed)
Slightly elevated LDL, on improving lifestyle, not on statin Calculated ASCVD 10 yr risk score 7%  Plan: Encourage improved lifestyle - low carb/cholesterol, reduce portion size, continue improving regular exercise Follow-up yearly lipid - reviewed statins, deferred for now not indicated

## 2018-08-28 NOTE — Assessment & Plan Note (Addendum)
Persistent clinical concern for suspected obstructive sleep apnea given reported symptoms with witnessed apnea, snoring and sleep disturbance, tired, excessive sleepiness. - Screening: ESS score 13 / STOP-Bang Score 5 = high risk - Neck Circumference: 15.5"  Proceed with patient contacting Sleep Center in 2020 due to deductible could not run PSG this year. Clinically same as before meets criteria high risk for OSA

## 2018-08-28 NOTE — Assessment & Plan Note (Signed)
Stable PreDM A1c 6.3, from prior 6.2 years ago  Plan:  1. Not on any therapy currently  2. Encourage improved lifestyle - low carb, low sugar diet, reduce portion size, continue improving regular exercise - alrdy has wt loss 3. Follow-up 6 months PreDM A1c

## 2018-08-28 NOTE — Assessment & Plan Note (Signed)
Weight down 5 lbs Improved diet / lifestyle

## 2018-09-01 ENCOUNTER — Other Ambulatory Visit: Payer: Self-pay

## 2018-09-01 ENCOUNTER — Ambulatory Visit: Payer: BLUE CROSS/BLUE SHIELD | Admitting: Gastroenterology

## 2018-09-01 ENCOUNTER — Encounter: Payer: Self-pay | Admitting: Gastroenterology

## 2018-09-01 VITALS — BP 134/85 | HR 69 | Ht 68.0 in | Wt 228.0 lb

## 2018-09-01 DIAGNOSIS — R1013 Epigastric pain: Secondary | ICD-10-CM | POA: Diagnosis not present

## 2018-09-01 NOTE — Progress Notes (Signed)
Amanda Pierce [address]  [address]  [place], [place] [address]  Main: [phone]  Fax: [phone]   Gastroenterology Consultation  Referring Provider:     Saralyn Pilar * Primary Care Physician:  Smitty Cords, DO Primary Gastroenterologist:  Dr. Melodie Pierce Reason for Consultation:    GERD, hiatal hernia, screening colonoscopy        HPI:    Chief Complaint  Patient presents with  . New Patient (Initial Visit)    Discuss Reflux, EGD/Colonoscopy    Amanda Pierce is a 62 y.o. y/o female referred for consultation & management  by Dr. Althea Charon, Netta Neat, DO.  Patient reports symptoms of dyspepsia, occurring once or twice a week.  Denies any heartburn.  Reports history of hiatal hernia diagnosed on EGD 5 to 6 years ago.  Denies any dysphagia.  Unsure if she was ever tested for H. pylori in the past.  No weight loss.  No family history of colon cancer.  Reports history of colonoscopy 5 to 6 years ago and denies any history of polyps at that time.  No blood in stool, no altered bowel habits, no nausea or vomiting.  Past Medical History:  Diagnosis Date  . Allergy   . Anemia   . Asthma     Past Surgical History:  Procedure Laterality Date  . BREAST MASS EXCISION Left 1974   benign  . PARATHYROIDECTOMY  1982    Prior to Admission medications   Medication Sig Start Date End Date Taking? Authorizing Provider  montelukast (SINGULAIR) 10 MG tablet Take 1 tablet (10 mg total) by mouth at bedtime as needed. 05/29/18  Yes Smitty Cords, DO  SYNTHROID 175 MCG tablet Take 1 tablet (175 mcg total) by mouth daily before breakfast. 08/28/18  Yes Karamalegos, Netta Neat, DO    Family History  Problem Relation Age of Onset  . Cancer Mother        Breast, Paget's removed nipple 68, breast 2001  . Colon polyps Mother   . Heart disease Father   . Heart attack Father   . Heart disease Maternal Grandfather   . Stroke Paternal  Grandmother   . Colon cancer Neg Hx      Social History   Tobacco Use  . Smoking status: Never Smoker  . Smokeless tobacco: Never Used  Substance Use Topics  . Alcohol use: Never    Frequency: Never  . Drug use: Never    Allergies as of 09/01/2018 - Review Complete 08/28/2018  Allergen Reaction Noted  . Aspirin Anaphylaxis 09/01/2018  . Contrast media [iodinated diagnostic agents] Nausea And Vomiting and Palpitations 09/01/2018  . Ibuprofen Anaphylaxis 09/01/2018  . Latex Rash 09/01/2018  . Penicillins Anaphylaxis 09/01/2018  . Rubbing alcohol [alcohol] Other (See Comments) 09/01/2018  . Sulfur Hives and Rash 09/01/2018    Review of Systems:    All systems reviewed and negative except where noted in HPI.   Physical Exam:  BP 134/85   Pulse 69   Ht 5\' 8"  (1.727 m)   Wt 228 lb (103.4 kg)   BMI 34.67 kg/m  No LMP recorded. Patient is postmenopausal. Psych:  Alert and cooperative. Normal mood and affect. General:   Alert,  Well-developed, well-nourished, pleasant and cooperative in NAD Head:  Normocephalic and atraumatic. Eyes:  Sclera clear, no icterus.   Conjunctiva pink. Ears:  Normal auditory acuity. Nose:  No deformity, discharge, or lesions. Mouth:  No deformity or lesions,oropharynx pink & moist. Neck:  Supple; no masses or thyromegaly. Abdomen:  Normal bowel sounds.  No bruits.  Soft, non-tender and non-distended without masses, hepatosplenomegaly or hernias noted.  No guarding or rebound tenderness.    Msk:  Symmetrical without gross deformities. Good, equal movement & strength bilaterally. Pulses:  Normal pulses noted. Extremities:  No clubbing or edema.  No cyanosis. Neurologic:  Alert and oriented x3;  grossly normal neurologically. Skin:  Intact without significant lesions or rashes. No jaundice. Lymph Nodes:  No significant cervical adenopathy. Psych:  Alert and cooperative. Normal mood and affect.   Labs: CBC    Component Value Date/Time   WBC 5.6  08/24/2018 0850   RBC 4.65 08/24/2018 0850   HGB 11.7 08/24/2018 0850   HCT 37.6 08/24/2018 0850   PLT 313 08/24/2018 0850   MCV 80.9 08/24/2018 0850   MCH 25.2 (L) 08/24/2018 0850   MCHC 31.1 (L) 08/24/2018 0850   RDW 14.8 08/24/2018 0850   LYMPHSABS 2,156 08/24/2018 0850   EOSABS 39 08/24/2018 0850   BASOSABS 11 08/24/2018 0850   CMP     Component Value Date/Time   NA 143 08/24/2018 0850   NA 140 09/29/2014   K 4.2 08/24/2018 0850   CL 107 08/24/2018 0850   CO2 32 08/24/2018 0850   GLUCOSE 105 (H) 08/24/2018 0850   BUN 12 08/24/2018 0850   BUN 14 09/29/2014   CREATININE 0.81 08/24/2018 0850   CALCIUM 9.0 08/24/2018 0850   PROT 6.9 08/24/2018 0850   AST 17 08/24/2018 0850   ALT 9 08/24/2018 0850   ALKPHOS 85 09/29/2014   BILITOT 0.4 08/24/2018 0850   GFRNONAA 77 08/24/2018 0850   GFRAA 89 08/24/2018 0850    Imaging Studies: No results found.  Assessment and Plan:   Amanda Pierce is a 62 y.o. y/o female has been referred for GERD and evaluation screening colonoscopy  Patient symptoms are more consistent with dyspepsia and GERD She has history of a hiatal hernia Patient educated extensively on acid reflux lifestyle modification, including buying a bed wedge, not eating 3 hrs before bedtime, diet modifications, and handout given for the same.   Due to dyspepsia, can proceed with EGD for evaluation for H. pylori, reevaluate her hernia and rule out any underlying Lesions.  Alternative of conservative management with no endoscopy was also discussed and patient would like to proceed with endoscopy after discussing the risks and benefits in detail.  Screening colonoscopy also indicated at this time and will proceed.  We do not have a procedure report from the past, but she states she was recommended to have a screening colonoscopy in 5 years from her last one.  I have discussed alternative options, risks & benefits,  which include, but are not limited to, bleeding,  infection, perforation,respiratory complication & drug reaction.  The patient agrees with this plan & written consent will be obtained.       Dr Amanda Pierce  Speech recognition software was used to dictate the above note.

## 2018-09-02 LAB — HM DEXA SCAN

## 2018-09-17 ENCOUNTER — Encounter: Payer: Self-pay | Admitting: Family Medicine

## 2018-09-17 DIAGNOSIS — M8588 Other specified disorders of bone density and structure, other site: Secondary | ICD-10-CM | POA: Insufficient documentation

## 2018-12-03 ENCOUNTER — Telehealth: Payer: Self-pay | Admitting: Gastroenterology

## 2018-12-03 NOTE — Telephone Encounter (Signed)
Pt is calling to schedule a procedure.

## 2018-12-07 ENCOUNTER — Ambulatory Visit (INDEPENDENT_AMBULATORY_CARE_PROVIDER_SITE_OTHER): Payer: BLUE CROSS/BLUE SHIELD | Admitting: Family Medicine

## 2018-12-07 ENCOUNTER — Ambulatory Visit: Payer: BLUE CROSS/BLUE SHIELD | Admitting: Gastroenterology

## 2018-12-07 ENCOUNTER — Encounter: Payer: Self-pay | Admitting: Family Medicine

## 2018-12-07 VITALS — BP 144/82 | HR 75 | Temp 98.4°F | Resp 16 | Ht 68.0 in | Wt 229.6 lb

## 2018-12-07 DIAGNOSIS — M7551 Bursitis of right shoulder: Secondary | ICD-10-CM | POA: Diagnosis not present

## 2018-12-07 DIAGNOSIS — H8112 Benign paroxysmal vertigo, left ear: Secondary | ICD-10-CM | POA: Diagnosis not present

## 2018-12-07 DIAGNOSIS — R29818 Other symptoms and signs involving the nervous system: Secondary | ICD-10-CM

## 2018-12-07 DIAGNOSIS — R030 Elevated blood-pressure reading, without diagnosis of hypertension: Secondary | ICD-10-CM | POA: Diagnosis not present

## 2018-12-07 MED ORDER — BACLOFEN 10 MG PO TABS: 5.0000 mg | ORAL_TABLET | Freq: Three times a day (TID) | ORAL | 1 refills | Status: DC | PRN

## 2018-12-07 NOTE — Progress Notes (Signed)
Subjective:    Patient ID: Amanda Pierce, female    DOB: 12-18-1955, 63 y.o.   MRN: [id number]  Amanda Pierce is a 63 y.o. female presenting on 12/07/2018 for Hypertension (as per patient ranges 171/97, right arm pain intermitten from past 2 weeks, nausea denies HA or dizziness)  Patient presents for a same day appointment.  HPI   Elevated BP without dx HTN Reports recent elevated readings home outside BP monitor up to 150-170 / 90s Current Meds - None, never on med Lifestyle: - Exercise: limited currently Significant life stressors with husband liver cancer, recent in past week similar onset to her elevated readings, there was concern with him initiating a palliative care program causing more stress for her now - Also admits arm pain see below Admits dizziness episode, see below Denies CP, dyspnea, HA, edema, lightheadedness  Right Shoulder Bursitis History of presumed underlying arthritis, has had bursitis before, also Carpal Tunnel, she now describes recent worsening R shoulder pain, difficulty with lifting arm and overhead activities - In past prior injections have been effective - Denies fall or injury or trauma, worsening numbness tingling  Follow-up Cerumen Impaction / Nausea / Vertigo She admits ever since last visit cerumen removal she had symptoms of dizziness and nausea. She has had persistent episodes of nausea for months since. [place] episode of vertigo brief room spinning with quick turn of head to side.  Suspected OSA Not on CPAP. She has no pursued PSG. Due to financial  Depression screen [facility] 2/9 12/07/2018 08/28/2018 05/29/2018  Decreased Interest 0 0 0  Down, Depressed, Hopeless 0 0 0  PHQ - 2 Score 0 0 0    Social History   Tobacco Use  . Smoking status: Never Smoker  . Smokeless tobacco: Never Used  Substance Use Topics  . Alcohol use: Never    Frequency: Never  . Drug use: Never    Review of Systems Per HPI unless specifically indicated above       Objective:    BP (!) 144/82 (BP Location: Left Arm, Cuff Size: Normal)   Pulse 75   Temp 98.4 F (36.9 C) (Oral)   Resp 16   Ht 5\' 8"  (1.727 m)   Wt 229 lb 9.6 oz (104.1 kg)   SpO2 100%   BMI 34.91 kg/m   Wt Readings from Last 3 Encounters:  12/07/18 229 lb 9.6 oz (104.1 kg)  09/01/18 228 lb (103.4 kg)  08/28/18 226 lb (102.5 kg)    Physical Exam Vitals signs and nursing note reviewed.  Constitutional:      General: She is not in acute distress.    Appearance: She is well-developed. She is not diaphoretic.     Comments: Well-appearing, comfortable, cooperative  HENT:     Head: Normocephalic and atraumatic.  Eyes:     General:        Right eye: No discharge.        Left eye: No discharge.     Conjunctiva/sclera: Conjunctivae normal.  Neck:     Musculoskeletal: Normal range of motion and neck supple.     Thyroid: No thyromegaly.  Cardiovascular:     Rate and Rhythm: Normal rate and regular rhythm.     Heart sounds: Normal heart sounds. No murmur.  Pulmonary:     Effort: Pulmonary effort is normal. No respiratory distress.     Breath sounds: Normal breath sounds. No wheezing or rales.  Musculoskeletal:     Comments: R Shoulder Inspection: Normal appearance  bilateral symmetrical Palpation: Non-tender to palpation over anterior, lateral, or posterior shoulder  ROM: Reduced active ROM forward flexion limited above shoulder level, abduction, internal rotation Special Testing: Rotator cuff testing negative for weakness with supraspinatus full can and empty can test, Hawkin's AC impingement reproduced pain Strength: Normal strength 5/5 flex/ext, ext rot / int rot, grip, rotator cuff str testing. Neurovascular: Distally intact pulses, sensation to light touch   Lymphadenopathy:     Cervical: No cervical adenopathy.  Skin:    General: Skin is warm and dry.     Findings: No erythema or rash.  Neurological:     Mental Status: She is alert and oriented to person, place, and  time.  Psychiatric:        Behavior: Behavior normal.     Comments: Well groomed, good eye contact, normal speech and thoughts    Results for orders placed or performed in visit on 09/17/18  HM DEXA SCAN  Result Value Ref Range   HM Dexa Scan      Osteopenia T-1.7 Spine, otherwise hip, femur nml - rpt 2 yr      Assessment & Plan:   Problem List Items Addressed This Visit    Elevated BP without diagnosis of hypertension - Primary Clinically improved BP after manual repeat Likely elevated now and recently on home checks due to acute stressors with husband illness, also with shoulder bursitis pain, poor sleep and likely untreated OSA  Plan No anti HTN med at this time Monitor outside BP Bring cuff next time to calibrate Treat shoulder Recommend PSG for treating OSA Follow-up sooner if elevated BP. Future labs, may warrant low dose BP med if need    Suspected sleep apnea Clinically concern OSA. Again recommend proceed with PSG to consider CPAP therapy     Other Visit Diagnoses    BPPV (benign paroxysmal positional vertigo), left     Clinically vertigo based on history Seems to be onset after ear flushing, however it is rare and episodic Trial of home Epley Maneuver exercises first - if need consider ENT refer    Acute bursitis of right shoulder     Clinical R shoulder bursitis based on history and limitations with ROM above shoulder level  History of allergic reaction anaphylaxis to NSAID Ibuprofen, has never taken others Advised limit to only Tylenol Add new rx Baclofen PRN, she has taken before, caution sedation Future return for possible X-ray and shoulder injection     Relevant Medications   baclofen (LIORESAL) 10 MG tablet      Meds ordered this encounter  Medications  . baclofen (LIORESAL) 10 MG tablet    Sig: Take 0.5-1 tablets (5-10 mg total) by mouth 3 (three) times daily as needed for muscle spasms.    Dispense:  30 each    Refill:  1      Follow up  plan: Return in about 3 months (around 03/07/2019) for keep apt as scheduled.   Saralyn Pilar, DO [facility] 12/07/2018, 2:58 PM

## 2018-12-07 NOTE — Patient Instructions (Addendum)
Thank you for coming to the office today.   Most likely elevated BP due to recent stress, and also can be poor sleep and sleep apnea.  Check BP at home, record readings, if persistently >160/100 then can notify office, otherwise it is okay to have OCCASIONAL elevated BP in this range.  ---------------------------------------------  Continue ice and heating pad  If need an injection in shoulder notify office  Start taking Baclofen (Lioresal) 10mg  (muscle relaxant) - start with half (cut) to one whole pill at night as needed for next 1-3 nights (may make you drowsy, caution with driving) see how it affects you, then if tolerated increase to one pill 2 to 3 times a day or (every 8 hours as needed)  - DO NOT TAKE any ibuprofen, aleve, motrin while you are taking this medicine  - It is safe to take Tylenol Ext Str 500mg  tabs - take 1 to 2 (max dose 1000mg ) every 6 hours as needed for breakthrough pain, max 24 hour daily dose is 6 to 8 tablets or 4000mg   -----------------  DUE for FASTING BLOOD WORK (no food or drink after midnight before the lab appointment, only water or coffee without cream/sugar on the morning of)  SCHEDULE "Lab Only" visit in the morning at the clinic for lab draw in 3 MONTHS   - Make sure Lab Only appointment is at about 1 week before your next appointment, so that results will be available  For Lab Results, once available within 2-3 days of blood draw, you can can log in to MyChart online to view your results and a brief explanation. Also, we can discuss results at next follow-up visit.   ----------------------  You have symptoms of Vertigo (Benign Paroxysmal Positional Vertigo) - This is commonly caused by inner ear fluid imbalance, sometimes can be worsened by allergies and sinus symptoms, otherwise it can occur randomly sometimes and we may never discover the exact cause. - To treat this, try the Epley Manuever (see diagrams/instructions below) at home up to 3  times a day for 1-2 weeks or until symptoms resolve  LEFT  If you develop significant worsening episode with vertigo that does not improve and you get severe headache, loss of vision, arm or leg weakness, slurred speech, or other concerning symptoms please seek immediate medical attention at Emergency Department.  Please schedule a follow-up appointment with Dr Althea CharonKaramalegos within 4 weeks if Vertigo not improving, and will consider Referral to Vestibular Rehab  See the next page for images describing the Epley Manuever.     ----------------------------------------------------------------------------------------------------------------------        Please schedule a Follow-up Appointment to: Return in about 3 months (around 03/07/2019) for keep apt as scheduled.  If you have any other questions or concerns, please feel free to call the office or send a message through MyChart. You may also schedule an earlier appointment if necessary.  Additionally, you may be receiving a survey about your experience at our office within a few days to 1 week by e-mail or mail. We value your feedback.  Saralyn PilarAlexander Chailyn Racette, DO [facility]

## 2018-12-11 NOTE — Telephone Encounter (Signed)
I spoke with pt regarding scheduling her Colonoscopy (screening), EGD (GERD) and she has decided on 01/06/2019 but currently does not know what insurance pays and has already had some medical bills. She will have medicare in June, so if this is an issue for her she may wait until then. Pt was given her diagnosis codes and CPT codes to contact her insurance company as to how much she may owe. I did inform pt that we do have someone who checks to see if procedures are covered but not as to cost. Updated med list. Pt concerned over Latex allergy, this was updated. Went over prep instructions but pt does desire to have Dulcolax tabs and mag. Citrate prep. Will check instructions with Dr. Maximino Greenland and send this information via My Chart. Pt desires to hold off on scheduling procedures at this time and will notify me after she checks with her insurance.

## 2019-01-04 ENCOUNTER — Other Ambulatory Visit: Payer: Self-pay

## 2019-01-04 ENCOUNTER — Ambulatory Visit
Admission: RE | Admit: 2019-01-04 | Discharge: 2019-01-04 | Disposition: A | Discharge status: Home / Self Care | Payer: BLUE CROSS/BLUE SHIELD | Source: Ambulatory Visit | Attending: Family Medicine | Admitting: Family Medicine

## 2019-01-04 ENCOUNTER — Encounter: Payer: Self-pay | Admitting: Family Medicine

## 2019-01-04 ENCOUNTER — Ambulatory Visit (INDEPENDENT_AMBULATORY_CARE_PROVIDER_SITE_OTHER): Payer: BLUE CROSS/BLUE SHIELD | Admitting: Family Medicine

## 2019-01-04 VITALS — BP 130/86 | HR 86 | Temp 98.5°F | Resp 16 | Ht 68.0 in | Wt 228.0 lb

## 2019-01-04 DIAGNOSIS — G8929 Other chronic pain: Secondary | ICD-10-CM | POA: Diagnosis not present

## 2019-01-04 DIAGNOSIS — M7551 Bursitis of right shoulder: Secondary | ICD-10-CM

## 2019-01-04 DIAGNOSIS — M25511 Pain in right shoulder: Secondary | ICD-10-CM | POA: Diagnosis not present

## 2019-01-04 DIAGNOSIS — E559 Vitamin D deficiency, unspecified: Secondary | ICD-10-CM | POA: Diagnosis not present

## 2019-01-04 MED ORDER — CYCLOBENZAPRINE HCL 10 MG PO TABS: 10.0000 mg | ORAL_TABLET | Freq: Two times a day (BID) | ORAL | 1 refills | Status: DC | PRN

## 2019-01-04 MED ORDER — TRIAMCINOLONE ACETONIDE 40 MG/ML IJ SUSP
40.0000 mg | Freq: Once | INTRAMUSCULAR | Status: AC
  Administered 2019-01-04: 40 mg via INTRA_ARTICULAR

## 2019-01-04 MED ORDER — LIDOCAINE HCL (PF) 1 % IJ SOLN
4.0000 mL | Freq: Once | INTRAMUSCULAR | Status: AC
  Administered 2019-01-04: 4 mL

## 2019-01-04 NOTE — Assessment & Plan Note (Signed)
Recent low vitamin D deficiency, per outside lab from GYN in [place] Dx with osteopenia on DEXA 08/2018 On vitamin D  Re-check lab result Vitamin D on 4/28 with other labs upcoming, adjust dose accordingly, may forward to her GYN

## 2019-01-04 NOTE — Patient Instructions (Addendum)
Thank you for coming to the office today.  You received a Right Shoulder Joint steroid injection today. - Lidocaine numbing medicine may ease the pain initially for a few hours until it wears off - As discussed, you may experience a "steroid flare" this evening or within 24-48 hours, anytime medicine is injected into an inflamed joint it can cause the pain to get worse temporarily - Everyone responds differently to these injections, it depends on the patient and the severity of the joint problem, it may provide anywhere from days to weeks, to months of relief. Ideal response is >6 months relief - Try to take it easy for next 1-2 days, avoid over activity and strain on joint (limit lifting for shoulder) - Recommend the following:   - For swelling - rest, compression sleeve / ACE wrap, elevation, and ice packs as needed for first few days   - For pain in future may use heating pad or moist heat as needed  Medication Added Flexeril (cyclobenzaprine) muscle relaxant today - this is more sedating and may be better at night for sleep for your shoulder. You can try it during day if needed or can stick with Baclofen during day, let me know if need any refills. - Caution sedation with driving or other activities  - Given the nature of your injury and duration of your pain/injury, I am concerned that if it is still not improving after next several weeks to months - despite injection and home PT exercises then we may have to refer back to [facility] Dr Martha Clan   Please schedule a Follow-up Appointment to: Return in about 2 months (around 03/06/2019) for as scheduled.  If you have any other questions or concerns, please feel free to call the office or send a message through MyChart. You may also schedule an earlier appointment if necessary.  Additionally, you may be receiving a survey about your experience at our office within a few days to 1 week by e-mail or mail. We value your  feedback.  Saralyn Pilar, DO [facility], [facility]  Range of Motion Shoulder Exercises  Pendulum Circles - Lean with your good arm against a counter or table for support [facility] forward with a wide stance (make sure your body is comfortable) - Your painful shoulder should hang down and feel "heavy" - Gently move your painful arm in small circles "clockwise" for several turns - Switch to "counterclockwise" for several turns - Early on keep circles narrow and move slowly - Later in rehab, move in larger circles and faster movement   Wall Crawl - Stand close (about 1-2 ft away) to a wall, facing it directly - Reach out with your arm of painful shoulder and place fingers (not palm) on wall - You should make contact with wall at your waist level - Slowly walk your fingers up the wall. Stay in contact with wall entire time, do not remove fingers - Keep walking fingers up wall until you reach shoulder level - You may feel tightening or mild discomfort, once you reach a height that causes pain or if you are already above your shoulder height then stop. Repeat from starting position. - Early on stand closer to wall, move fingers slowly, and stay at or below shoulder level - Later in rehab, stand farther away from wall (fingertips), move fingers quicker, go above shoulder level     Shoulder Exercises Ask your health care provider which exercises are safe for you. Do exercises  exactly as told by your health care provider and adjust them as directed. It is normal to feel mild stretching, pulling, tightness, or discomfort as you do these exercises, but you should stop right away if you feel sudden pain or your pain gets worse.Do not begin these exercises until told by your health care provider. Range of Motion Exercises        These exercises warm up your muscles and joints and improve the movement and flexibility of your shoulder. These exercises also help to relieve  pain, numbness, and tingling. These exercises involve stretching your injured shoulder directly. Exercise A: Pendulum 1. Stand near a wall or a surface that you can hold onto for balance. 2. Bend at the waist and let your left / right arm hang straight down. Use your other arm to support you. Keep your back straight and do not lock your knees. 3. Relax your left / right arm and shoulder muscles, and move your hips and your trunk so your left / right arm swings freely. Your arm should swing because of the motion of your body, not because you are using your arm or shoulder muscles. 4. Keep moving your body so your arm swings in the following directions, as told by your health care provider: ? Side to side. ? Forward and backward. ? In clockwise and counterclockwise circles. 5. Continue each motion for __________ seconds, or for as long as told by your health care provider. 6. Slowly return to the starting position. Repeat __________ times. Complete this exercise __________ times a day. Exercise B:Flexion, Standing 1. Stand and hold a broomstick, a cane, or a similar object. Place your hands a little more than shoulder-width apart on the object. Your left / right hand should be palm-up, and your other hand should be palm-down. 2. Keep your elbow straight and keep your shoulder muscles relaxed. Push the stick down with your healthy arm to raise your left / right arm in front of your body, and then over your head until you feel a stretch in your shoulder. ? Avoid shrugging your shoulder while you raise your arm. Keep your shoulder blade tucked down toward the middle of your back. 3. Hold for __________ seconds. 4. Slowly return to the starting position. Repeat __________ times. Complete this exercise __________ times a day. Exercise C: Abduction, Standing 1. Stand and hold a broomstick, a cane, or a similar object. Place your hands a little more than shoulder-width apart on the object. Your left /  right hand should be palm-up, and your other hand should be palm-down. 2. While keeping your elbow straight and your shoulder muscles relaxed, push the stick across your body toward your left / right side. Raise your left / right arm to the side of your body and then over your head until you feel a stretch in your shoulder. ? Do not raise your arm above shoulder height, unless your health care provider tells you to do that. ? Avoid shrugging your shoulder while you raise your arm. Keep your shoulder blade tucked down toward the middle of your back. 3. Hold for __________ seconds. 4. Slowly return to the starting position. Repeat __________ times. Complete this exercise __________ times a day. Exercise D:Internal Rotation 1. Place your left / right hand behind your back, palm-up. 2. Use your other hand to dangle an exercise band, a towel, or a similar object over your shoulder. Grasp the band with your left / right hand so you are holding onto  both ends. 3. Gently pull up on the band until you feel a stretch in the front of your left / right shoulder. ? Avoid shrugging your shoulder while you raise your arm. Keep your shoulder blade tucked down toward the middle of your back. 4. Hold for __________ seconds. 5. Release the stretch by letting go of the band and lowering your hands. Repeat __________ times. Complete this exercise __________ times a day. Stretching Exercises  These exercises warm up your muscles and joints and improve the movement and flexibility of your shoulder. These exercises also help to relieve pain, numbness, and tingling. These exercises are done using your healthy shoulder to help stretch the muscles of your injured shoulder. Exercise E: Research officer, political party (External Rotation and Abduction) 1. Stand in a doorway with one of your feet slightly in front of the other. This is called a staggered stance. If you cannot reach your forearms to the door frame, stand facing a corner of a  room. 2. Choose one of the following positions as told by your health care provider: ? Place your hands and forearms on the door frame above your head. ? Place your hands and forearms on the door frame at the height of your head. ? Place your hands on the door frame at the height of your elbows. 3. Slowly move your weight onto your front foot until you feel a stretch across your chest and in the front of your shoulders. Keep your head and chest upright and keep your abdominal muscles tight. 4. Hold for __________ seconds. 5. To release the stretch, shift your weight to your back foot. Repeat __________ times. Complete this stretch __________ times a day. Exercise F:Extension, Standing 1. Stand and hold a broomstick, a cane, or a similar object behind your back. ? Your hands should be a little wider than shoulder-width apart. ? Your palms should face away from your back. 2. Keeping your elbows straight and keeping your shoulder muscles relaxed, move the stick away from your body until you feel a stretch in your shoulder. ? Avoid shrugging your shoulders while you move the stick. Keep your shoulder blade tucked down toward the middle of your back. 3. Hold for __________ seconds. 4. Slowly return to the starting position. Repeat __________ times. Complete this exercise __________ times a day. Strengthening Exercises           These exercises build strength and endurance in your shoulder. Endurance is the ability to use your muscles for a long time, even after they get tired. Exercise G:External Rotation 1. Sit in a stable chair without armrests. 2. Secure an exercise band at elbow height on your left / right side. 3. Place a soft object, such as a folded towel or a small pillow, between your left / right upper arm and your body to move your elbow a few inches away (about 10 cm) from your side. 4. Hold the end of the band so it is tight and there is no slack. 5. Keeping your elbow  pressed against the soft object, move your left / right forearm out, away from your abdomen. Keep your body steady so only your forearm moves. 6. Hold for __________ seconds. 7. Slowly return to the starting position. Repeat __________ times. Complete this exercise __________ times a day. Exercise H:Shoulder Abduction 1. Sit in a stable chair without armrests, or stand. 2. Hold a __________ weight in your left / right hand, or hold an exercise band with both hands. 3. Start  with your arms straight down and your left / right palm facing in, toward your body. 4. Slowly lift your left / right hand out to your side. Do not lift your hand above shoulder height unless your health care provider tells you that this is safe. ? Keep your arms straight. ? Avoid shrugging your shoulder while you do this movement. Keep your shoulder blade tucked down toward the middle of your back. 5. Hold for __________ seconds. 6. Slowly lower your arm, and return to the starting position. Repeat __________ times. Complete this exercise __________ times a day. Exercise I:Shoulder Extension 1. Sit in a stable chair without armrests, or stand. 2. Secure an exercise band to a stable object in front of you where it is at shoulder height. 3. Hold one end of the exercise band in each hand. Your palms should face each other. 4. Straighten your elbows and lift your hands up to shoulder height. 5. Step back, away from the secured end of the exercise band, until the band is tight and there is no slack. 6. Squeeze your shoulder blades together as you pull your hands down to the sides of your thighs. Stop when your hands are straight down by your sides. Do not let your hands go behind your body. 7. Hold for __________ seconds. 8. Slowly return to the starting position. Repeat __________ times. Complete this exercise __________ times a day. Exercise J:Standing Shoulder Row 1. Sit in a stable chair without armrests, or  stand. 2. Secure an exercise band to a stable object in front of you so it is at waist height. 3. Hold one end of the exercise band in each hand. Your palms should be in a thumbs-up position. 4. Bend each of your elbows to an "L" shape (about 90 degrees) and keep your upper arms at your sides. 5. Step back until the band is tight and there is no slack. 6. Slowly pull your elbows back behind you. 7. Hold for __________ seconds. 8. Slowly return to the starting position. Repeat __________ times. Complete this exercise __________ times a day. Exercise K:Shoulder Press-Ups 1. Sit in a stable chair that has armrests. Sit upright, with your feet flat on the floor. 2. Put your hands on the armrests so your elbows are bent and your fingers are pointing forward. Your hands should be about even with the sides of your body. 3. Push down on the armrests and use your arms to lift yourself off of the chair. Straighten your elbows and lift yourself up as much as you comfortably can. ? Move your shoulder blades down, and avoid letting your shoulders move up toward your ears. ? Keep your feet on the ground. As you get stronger, your feet should support less of your body weight as you lift yourself up. 4. Hold for __________ seconds. 5. Slowly lower yourself back into the chair. Repeat __________ times. Complete this exercise __________ times a day. Exercise L: Wall Push-Ups 1. Stand so you are facing a stable wall. Your feet should be about one arm-length away from the wall. 2. Lean forward and place your palms on the wall at shoulder height. 3. Keep your feet flat on the floor as you bend your elbows and lean forward toward the wall. 4. Hold for __________ seconds. 5. Straighten your elbows to push yourself back to the starting position. Repeat __________ times. Complete this exercise __________ times a day. This information is not intended to replace advice given to you by your  health care provider. Make  sure you discuss any questions you have with your health care provider. Document Released: 08/28/2005 Document Revised: 02/17/2018 Document Reviewed: 06/25/2015 Elsevier Interactive Patient Education  2019 [employer].

## 2019-01-04 NOTE — Progress Notes (Signed)
Subjective:    Patient ID: Amanda Pierce, female    DOB: [birth year], 63 y.o.   MRN: [id number]  Amanda Pierce is a 63 y.o. female presenting on 01/04/2019 for Arm Pain (right arm onset more than month)  Patient presents for a same day appointment.  HPI   FOLLOW-UP Right Shoulder Bursitis, Chronic - Last visit with me 12/07/18, for initial visit for same problem Right shoulder pain and bursitis, treated with trial on baclofen muscle relaxant, and other conservative care, she cannot take NSAID due to allergic reaction anaphylaxis, see prior notes for background information. - Interval update with improved intermittently on baclofen, usually worse at night will wake up due to arm position  - Today patient reports overall doing fairly stable since last visit, but still does not seem to be resolving. Still has episodes waking up during night due to R shoulder pain, if lay on it wrong, this is worse. During day it still limits her range of motion and function due to pain and soreness, seems pain is deeper in upper arm and extending to elbow. Worse overhead activities - Similar past history of problem in Left shoulder, saw [facility], Dr Martha Clan, had steroid injection and PT for 1 month, with complete resolution of the problem, has not returned since, that was few year ago - No prior problem with R shoulder, never had injection or other treatment - Denies fall or injury or trauma, worsening numbness tingling  Follow-up Vitamin D Deficiency Recently followed by GYN in [place] they did DEXA 08/2018 showed osteopenia, and advised vitamin D deficiency, she is taking Vitamin D currently, and now asks to check in April 2020 instead of returning to [facility] Maintenance *Additional update regarding colon cancer screening - see last note from 08/2018 - she says she can try to get copy of last colonoscopy from 2013 approx, was told negative, no polyp, and good for 8 to 10 years, insurance will not  cover until 8-10 years, possibly soonest in 1-3 years. - She may reconsider Cologuard in future instead.  Depression screen [facility] 2/9 01/04/2019 12/07/2018 08/28/2018  Decreased Interest 0 0 0  Down, Depressed, Hopeless 0 0 0  PHQ - 2 Score 0 0 0    Social History   Tobacco Use  . Smoking status: Never Smoker  . Smokeless tobacco: Never Used  Substance Use Topics  . Alcohol use: Never    Frequency: Never  . Drug use: Never    Review of Systems Per HPI unless specifically indicated above     Objective:    BP 130/86   Pulse 86   Temp 98.5 F (36.9 C) (Oral)   Resp 16   Ht 5\' 8"  (1.727 m)   Wt 228 lb (103.4 kg)   SpO2 100%   BMI 34.67 kg/m   Wt Readings from Last 3 Encounters:  01/04/19 228 lb (103.4 kg)  12/07/18 229 lb 9.6 oz (104.1 kg)  09/01/18 228 lb (103.4 kg)    Physical Exam Vitals signs and nursing note reviewed.  Constitutional:      General: She is not in acute distress.    Appearance: She is well-developed. She is not diaphoretic.     Comments: Well-appearing, comfortable, cooperative  HENT:     Head: Normocephalic and atraumatic.  Eyes:     General:        Right eye: No discharge.        Left eye: No discharge.     Conjunctiva/sclera:  Conjunctivae normal.  Neck:     Musculoskeletal: Normal range of motion and neck supple.     Thyroid: No thyromegaly.  Cardiovascular:     Rate and Rhythm: Normal rate and regular rhythm.     Heart sounds: Normal heart sounds. No murmur.  Pulmonary:     Effort: Pulmonary effort is normal. No respiratory distress.     Breath sounds: Normal breath sounds. No wheezing or rales.  Musculoskeletal:     Comments: R Shoulder - similar to last exam in February 2020  Inspection: Normal appearance bilateral symmetrical Palpation: Non-tender to palpation over anterior, lateral, or posterior shoulder  ROM: Slightly improved but reduced active ROM forward flexion limited above shoulder level, abduction, internal  rotation Special Testing: Rotator cuff testing negative for weakness with supraspinatus full can and empty can test, Hawkin's AC impingement still reproduced pain mild to moderate  Strength: Normal strength 5/5 flex/ext, ext rot / int rot, grip, rotator cuff str testing. Neurovascular: Distally intact pulses, sensation to light touch   Lymphadenopathy:     Cervical: No cervical adenopathy.  Skin:    General: Skin is warm and dry.     Findings: No erythema or rash.  Neurological:     Mental Status: She is alert and oriented to person, place, and time.  Psychiatric:        Behavior: Behavior normal.     Comments: Well groomed, good eye contact, normal speech and thoughts    ________________________________________________________ PROCEDURE NOTE Date: 01/04/19 Right Shoulder subacromial injection Discussed benefits and risks (including pain, bleeding, infection, steroid flare). Verbal consent given by patient. Medication:  1 cc Kenalog 40mg  and 4 cc Lidocaine 1% without epi Time Out taken  Landmarks identified. Area cleansed with alcohol wipes. Using 21 gauge and 1, 1/2 inch needle, Right subacromial bursa space was injected (with above listed medication) via posterior approach cold spray used for superficial anesthetic. Sterile bandage placed. Patient tolerated procedure well without bleeding or paresthesias. No complications.     Results for orders placed or performed in visit on 09/17/18  HM DEXA SCAN  Result Value Ref Range   HM Dexa Scan      Osteopenia T-1.7 Spine, otherwise hip, femur nml - rpt 2 yr      Assessment & Plan:   Problem List Items Addressed This Visit    Vitamin D deficiency    Recent low vitamin D deficiency, per outside lab from GYN in [place] Dx with osteopenia on DEXA 08/2018 On vitamin D  Re-check lab result Vitamin D on 4/28 with other labs upcoming, adjust dose accordingly, may forward to her GYN       Other Visit Diagnoses    Chronic bursitis  of right shoulder    -  Primary   Relevant Medications   lidocaine (PF) (XYLOCAINE) 1 % injection 4 mL (Completed)   triamcinolone acetonide (KENALOG-40) injection 40 mg (Completed)   cyclobenzaprine (FLEXERIL) 10 MG tablet   Other Relevant Orders   DG Shoulder Right   Chronic right shoulder pain       Relevant Medications   lidocaine (PF) (XYLOCAINE) 1 % injection 4 mL (Completed)   triamcinolone acetonide (KENALOG-40) injection 40 mg (Completed)   cyclobenzaprine (FLEXERIL) 10 MG tablet      Consistent with persistent now chronic R-shoulder bursitis vs rotator cuff tendinopathy with some reduced active ROM but without significant evidence of muscle tear (no weakness). Known repetitive overhead/strenuous activity as likely etiology. No clear etiology of injury. 62  yr old patient with likely underlying arthritis - Similar problem on LEFT shoulder years ago resolved with injection and PT per [facility] Dr Martha Clan - No imaging on chart  Plan: R Shoulder X-ray today, pending result  Right shoulder subacromial steroid injection performed today, see procedure note for details.  1. Change Baclofen to Flexeril 5-10 QHS PRN - caution sedation - may use baclofen during day if prefer 2. May take Tylenol Ex Str 1-2 q 6 hr PRN 3. Relative rest but keep shoulder mobile, demonstrated ROM exercises, avoid heavy lifting - HOME PT exercises - will defer any referral 4. May try heating pad PRN 5. Follow-up 4-6 weeks if not improved for re-evaluation - consider referral to formal Physical Therapy or Orthopedics   Meds ordered this encounter  Medications  . lidocaine (PF) (XYLOCAINE) 1 % injection 4 mL  . triamcinolone acetonide (KENALOG-40) injection 40 mg  . cyclobenzaprine (FLEXERIL) 10 MG tablet    Sig: Take 1 tablet (10 mg total) by mouth 2 (two) times daily as needed for muscle spasms.    Dispense:  30 tablet    Refill:  1    Follow up plan: Return in about 2 months (around 03/06/2019)  for as scheduled.  Saralyn Pilar, DO [facility] 01/04/2019, 4:18 PM

## 2019-01-06 ENCOUNTER — Other Ambulatory Visit: Payer: Self-pay

## 2019-01-07 ENCOUNTER — Telehealth: Payer: Self-pay

## 2019-01-07 NOTE — Telephone Encounter (Signed)
For NDC number.

## 2019-02-23 ENCOUNTER — Other Ambulatory Visit: Payer: Self-pay

## 2019-02-23 ENCOUNTER — Other Ambulatory Visit: Payer: BLUE CROSS/BLUE SHIELD

## 2019-02-23 DIAGNOSIS — D509 Iron deficiency anemia, unspecified: Secondary | ICD-10-CM

## 2019-02-23 DIAGNOSIS — E559 Vitamin D deficiency, unspecified: Secondary | ICD-10-CM

## 2019-02-23 DIAGNOSIS — R030 Elevated blood-pressure reading, without diagnosis of hypertension: Secondary | ICD-10-CM

## 2019-02-23 DIAGNOSIS — E039 Hypothyroidism, unspecified: Secondary | ICD-10-CM

## 2019-02-23 DIAGNOSIS — E538 Deficiency of other specified B group vitamins: Secondary | ICD-10-CM

## 2019-02-23 DIAGNOSIS — R7303 Prediabetes: Secondary | ICD-10-CM

## 2019-02-24 LAB — HEMOGLOBIN A1C
Hgb A1c MFr Bld: 6.2 % of total Hgb — ABNORMAL HIGH (ref <5.7)
Mean Plasma Glucose: 131 (calc)
eAG (mmol/L): 7.3 (calc)

## 2019-02-24 LAB — CBC WITH DIFFERENTIAL/PLATELET
Absolute Monocytes: 448 cells/uL (ref 200–950)
Basophils Absolute: 19 cells/uL (ref 0–200)
Basophils Relative: 0.3 %
Eosinophils Absolute: 32 cells/uL (ref 15–500)
Eosinophils Relative: 0.5 %
HCT: 37.6 % (ref 35.0–45.0)
Hemoglobin: 12 g/dL (ref 11.7–15.5)
Lymphs Abs: 2349 cells/uL (ref 850–3900)
MCH: 25.4 pg — ABNORMAL LOW (ref 27.0–33.0)
MCHC: 31.9 g/dL — ABNORMAL LOW (ref 32.0–36.0)
MCV: 79.7 fL — ABNORMAL LOW (ref 80.0–100.0)
MPV: 10.6 fL (ref 7.5–12.5)
Monocytes Relative: 7 %
Neutro Abs: 3552 cells/uL (ref 1500–7800)
Neutrophils Relative %: 55.5 %
Platelets: 323 10*3/uL (ref 140–400)
RBC: 4.72 10*6/uL (ref 3.80–5.10)
RDW: 16 % — ABNORMAL HIGH (ref 11.0–15.0)
Total Lymphocyte: 36.7 %
WBC: 6.4 10*3/uL (ref 3.8–10.8)

## 2019-02-24 LAB — COMPLETE METABOLIC PANEL WITH GFR
AG Ratio: 1.2 (calc) (ref 1.0–2.5)
ALT: 9 U/L (ref 6–29)
AST: 13 U/L (ref 10–35)
Albumin: 3.8 g/dL (ref 3.6–5.1)
Alkaline phosphatase (APISO): 71 U/L (ref 37–153)
BUN: 16 mg/dL (ref 7–25)
CO2: 29 mmol/L (ref 20–32)
Calcium: 9.3 mg/dL (ref 8.6–10.4)
Chloride: 105 mmol/L (ref 98–110)
Creat: 0.81 mg/dL (ref 0.50–0.99)
GFR, Est African American: 89 mL/min/{1.73_m2} (ref >=60)
GFR, Est Non African American: 77 mL/min/{1.73_m2} (ref >=60)
Globulin: 3.3 g/dL (calc) (ref 1.9–3.7)
Glucose, Bld: 111 mg/dL — ABNORMAL HIGH (ref 65–99)
Potassium: 5 mmol/L (ref 3.5–5.3)
Sodium: 141 mmol/L (ref 135–146)
Total Bilirubin: 0.5 mg/dL (ref 0.2–1.2)
Total Protein: 7.1 g/dL (ref 6.1–8.1)

## 2019-02-24 LAB — VITAMIN D 25 HYDROXY (VIT D DEFICIENCY, FRACTURES): Vit D, 25-Hydroxy: 25 ng/mL — ABNORMAL LOW (ref 30–100)

## 2019-02-24 LAB — IRON,TIBC AND FERRITIN PANEL
%SAT: 17 % (calc) (ref 16–45)
Ferritin: 13 ng/mL — ABNORMAL LOW (ref 16–288)
Iron: 61 ug/dL (ref 45–160)
TIBC: 351 mcg/dL (calc) (ref 250–450)

## 2019-02-24 LAB — VITAMIN B12: Vitamin B-12: 936 pg/mL (ref 200–1100)

## 2019-02-24 LAB — T4, FREE: Free T4: 1.1 ng/dL (ref 0.8–1.8)

## 2019-02-24 LAB — TSH: TSH: 9.26 mIU/L — ABNORMAL HIGH (ref 0.40–4.50)

## 2019-02-26 ENCOUNTER — Other Ambulatory Visit: Payer: Self-pay | Admitting: Family Medicine

## 2019-02-26 ENCOUNTER — Other Ambulatory Visit: Payer: Self-pay

## 2019-02-26 ENCOUNTER — Encounter: Payer: Self-pay | Admitting: Family Medicine

## 2019-02-26 ENCOUNTER — Ambulatory Visit (INDEPENDENT_AMBULATORY_CARE_PROVIDER_SITE_OTHER): Payer: Medicare Other | Admitting: Family Medicine

## 2019-02-26 DIAGNOSIS — E559 Vitamin D deficiency, unspecified: Secondary | ICD-10-CM | POA: Diagnosis not present

## 2019-02-26 DIAGNOSIS — D508 Other iron deficiency anemias: Secondary | ICD-10-CM

## 2019-02-26 DIAGNOSIS — E039 Hypothyroidism, unspecified: Secondary | ICD-10-CM

## 2019-02-26 DIAGNOSIS — D509 Iron deficiency anemia, unspecified: Secondary | ICD-10-CM | POA: Insufficient documentation

## 2019-02-26 DIAGNOSIS — R7303 Prediabetes: Secondary | ICD-10-CM

## 2019-02-26 MED ORDER — VITAMIN D3 125 MCG (5000 UT) PO CAPS: 5000.0000 [IU] | ORAL_CAPSULE | Freq: Every day | ORAL | 0 refills | Status: DC

## 2019-02-26 NOTE — Assessment & Plan Note (Signed)
Clinically with mild fatigue and tired, similar to prior symptoms Known long history of mild anemia, recent labs reviewed - borderline low Hgb and MCV, anemia panel suggestive of mild low ferritin and [place] iron stores/saturation  Plan - Discussed options, no other clinical concern right now - fairly stable except tired/fatigue, can be multifactorial, also discussed OSA previously - declined sleep study due to financial, also hypothyroidism is factor and vitamin D  - Start iron supplement Ferrous sulfate 325mg  daily wc then increase up to BID as tolerated, w/ Vit C - Follow-up labs again in 3 months discuss options  Future consider refer if indicated

## 2019-02-26 NOTE — Assessment & Plan Note (Addendum)
Stable PreDM A1c 6.2 result, prior 6.2 to 6.3  Plan:  1. Not on any therapy currently  2. Encourage improved lifestyle - low carb, low sugar diet, reduce portion size, continue improving regular exercise 3. Follow-up 3 months PreDM w/ lab

## 2019-02-26 NOTE — Progress Notes (Signed)
Subjective:    Patient ID: Amanda Pierce, female    DOB: [birth year], 63 y.o.   MRN: [id number]  Amanda Pierce is a 63 y.o. female presenting on 02/26/2019 for Pre-Diabetes and Anemia  Virtual / Telehealth Encounter - Video Visit via [email or web address] The purpose of this virtual visit is to provide medical care while limiting exposure to the novel coronavirus (COVID19) for both patient and office staff.  Consent was obtained for remote visit:  Yes.   Answered questions that patient had about telehealth interaction:  Yes.   I discussed the limitations, risks, security and privacy concerns of performing an evaluation and management service by video/telephone. I also discussed with the patient that there may be a patient responsible charge related to this service. The patient expressed understanding and agreed to proceed.  Patient Location: Home Provider Location: [facility] (Office)   HPI   FOLLOW-UP Hypothyroidism - Last visit with me for this issue 08/2019, had x 2 readings mild low TSH, but continued on Synthroid dose daily, see prior notes for background information. - Interval update, last lab showed TSH up now >8, previously was low range, but still normal Free T4 - Today she reports feels tired still, but not jittery anymore. She feels like reduced energy - She does admit to taking Synthroid at different time interval now, seems sleeping later, taking by 10-11am empty stomach. No longer 7-8am Denies temperature instability, edema, hair or skin changes  Anemia, iron deficiency, chronic mild Long history of prior anemia, reported in past with required iron treatment. No prior labs with anemia, however he recent lab shows Hgb 11.7 borderline low. She also has history of Vitamin B12 deficiency, in past she received B12 injections for energy.  Interval updates - she had repeat labs, CBC showed improved but still borderline low Hgb. Mild low MCV. Anemia panel showed borderline  low normal Ferritin and Tsat., normal B12. - She is not on iron pills at this time Diet is balanced - Admits feels a little tired Denies any blood loss or dark stool  Pre-Diabetes Recent lab 6.2 to 6.3 - now Last lab A1c 6.2 Meds: not on  medication Lifestyle: - Diet (balanced, admits still improving diet) Denies hypoglycemia  Vitamin D Deficiency Mild low Vit D at 25, on last lab. She is taking Vitamin D3 1,000 iu daily   Depression screen [facility] 2/9 02/26/2019 01/04/2019 12/07/2018  Decreased Interest 0 0 0  Down, Depressed, Hopeless 0 0 0  PHQ - 2 Score 0 0 0    Social History   Tobacco Use  . Smoking status: Never Smoker  . Smokeless tobacco: Never Used  Substance Use Topics  . Alcohol use: Never    Frequency: Never  . Drug use: Never    Review of Systems Per HPI unless specifically indicated above     Objective:    There were no vitals taken for this visit.  Wt Readings from Last 3 Encounters:  01/04/19 228 lb (103.4 kg)  12/07/18 229 lb 9.6 oz (104.1 kg)  09/01/18 228 lb (103.4 kg)    Physical Exam   Note examination was completely remotely via video observation objective data only  Gen - well-appearing, no acute distress or apparent pain, comfortable HEENT - eyes appear clear without discharge or redness Heart/Lungs - cannot examine virtually - observed no evidence of coughing or labored breathing. Skin - face visible today- no rash Neuro - awake, alert, oriented Psych - not anxious appearing  Recent Labs    08/24/18 0850 02/23/19 0829  HGBA1C 6.3* 6.2*     Results for orders placed or performed in visit on 02/23/19  COMPLETE METABOLIC PANEL WITH GFR  Result Value Ref Range   Glucose, Bld 111 (H) 65 - 99 mg/dL   BUN 16 7 - 25 mg/dL   Creat 1.610.81 0.960.50 - 0.450.99 mg/dL   GFR, Est Non African American 77 > OR = 60 mL/min/1.9273m2   GFR, Est African American 89 > OR = 60 mL/min/1.2473m2   BUN/Creatinine Ratio NOT APPLICABLE 6 - 22 (calc)   Sodium 141 135 -  146 mmol/L   Potassium 5.0 3.5 - 5.3 mmol/L   Chloride 105 98 - 110 mmol/L   CO2 29 20 - 32 mmol/L   Calcium 9.3 8.6 - 10.4 mg/dL   Total Protein 7.1 6.1 - 8.1 g/dL   Albumin 3.8 3.6 - 5.1 g/dL   Globulin 3.3 1.9 - 3.7 g/dL (calc)   AG Ratio 1.2 1.0 - 2.5 (calc)   Total Bilirubin 0.5 0.2 - 1.2 mg/dL   Alkaline phosphatase (APISO) 71 37 - 153 U/L   AST 13 10 - 35 U/L   ALT 9 6 - 29 U/L  T4, free  Result Value Ref Range   Free T4 1.1 0.8 - 1.8 ng/dL  TSH  Result Value Ref Range   TSH 9.26 (H) 0.40 - 4.50 mIU/L  Iron, TIBC and Ferritin Panel  Result Value Ref Range   Iron 61 45 - 160 mcg/dL   TIBC 409351 811250 - 914450 mcg/dL (calc)   %SAT 17 16 - 45 % (calc)   Ferritin 13 (L) 16 - 288 ng/mL  Vitamin B12  Result Value Ref Range   Vitamin B-12 936 200 - 1,100 pg/mL  VITAMIN D 25 Hydroxy (Vit-D Deficiency, Fractures)  Result Value Ref Range   Vit D, 25-Hydroxy 25 (L) 30 - 100 ng/mL  CBC with Differential/Platelet  Result Value Ref Range   WBC 6.4 3.8 - 10.8 Thousand/uL   RBC 4.72 3.80 - 5.10 Million/uL   Hemoglobin 12.0 11.7 - 15.5 g/dL   HCT 78.237.6 95.635.0 - 21.345.0 %   MCV 79.7 (L) 80.0 - 100.0 fL   MCH 25.4 (L) 27.0 - 33.0 pg   MCHC 31.9 (L) 32.0 - 36.0 g/dL   RDW 08.616.0 (H) 57.811.0 - 46.915.0 %   Platelets 323 140 - 400 Thousand/uL   MPV 10.6 7.5 - 12.5 fL   Neutro Abs 3,552 1,500 - 7,800 cells/uL   Lymphs Abs 2,349 850 - 3,900 cells/uL   Absolute Monocytes 448 200 - 950 cells/uL   Eosinophils Absolute 32 15 - 500 cells/uL   Basophils Absolute 19 0 - 200 cells/uL   Neutrophils Relative % 55.5 %   Total Lymphocyte 36.7 %   Monocytes Relative 7.0 %   Eosinophils Relative 0.5 %   Basophils Relative 0.3 %  Hemoglobin A1c  Result Value Ref Range   Hgb A1c MFr Bld 6.2 (H) <5.7 % of total Hgb   Mean Plasma Glucose 131 (calc)   eAG (mmol/L) 7.3 (calc)      Assessment & Plan:   Problem List Items Addressed This Visit    Hypothyroidism (acquired)    Clinically possible mild fatigue/tired  - otherwise asymptomatic Now with elevated TSH, confusing as previously had LOW TSH x 2 on same dose Normal Free T4 Taking Synthroid some dose timing changes due to lifestyle change w/ coronavirus pandemic 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  Plan Discussed options - agree to still CONTINUE Synthroid brand daily - agree not to adjust based on slightly high TSH, bc normal Free T4 - we think that likely symptoms from iron instead - Follow-up 3 months labs for thyroid      Iron deficiency anemia    Clinically with mild fatigue and tired, similar to prior symptoms Known long history of mild anemia, recent labs reviewed - borderline low Hgb and MCV, anemia panel suggestive of mild low ferritin and Tsat iron stores/saturation  Plan - Discussed options, no other clinical concern right now - fairly stable except tired/fatigue, can be multifactorial, also discussed OSA previously - declined sleep study due to financial, also hypothyroidism is factor and vitamin D  - Start iron supplement Ferrous sulfate 325mg  daily wc then increase up to BID as tolerated, w/ Vit C - Follow-up labs again in 3 months discuss options  Future consider refer if indicated      Relevant Medications   ferrous sulfate 325 (65 FE) MG tablet   Pre-diabetes - Primary    Stable PreDM A1c 6.2 result, prior 6.2 to 6.3  Plan:  1. Not on any therapy currently  2. Encourage improved lifestyle - low carb, low sugar diet, reduce portion size, continue improving regular exercise 3. Follow-up 3 months PreDM w/ lab      Vitamin D deficiency    Low Vitamin D, mild Suspected contributing to symptoms Stop 1k iu daily Start OTC Vitamin D3 5,000 iu daily for 12 weeks then reduce to OTC Vitamin D3 2,000 iu daily for maintenance       Relevant Medications   Cholecalciferol (VITAMIN D3) 125 MCG (5000 UT) CAPS      Meds ordered this encounter  Medications  . Cholecalciferol (VITAMIN D3) 125  MCG (5000 UT) CAPS    Sig: Take 1 capsule (5,000 Units total) by mouth daily. For 8 weeks, then start Vitamin D3 2,000 units daily (OTC)    Dispense:  60 capsule    Refill:  0    Follow-up: - Return in 3-4 months for follow-up Lab results / Anemia, PreDM, Thyroid - Future labs 3-4 months - A1c, CBC, Anemia Panel, TSH, Free T4  Patient verbalizes understanding with the above medical recommendations including the limitation of remote medical advice.  Specific follow-up and call-back criteria were given for patient to follow-up or seek medical care more urgently if needed.  Total duration of direct patient care provided via video conference: 15 minutes   Saralyn Pilar, DO [facility] 02/26/2019, 1:21 PM

## 2019-02-26 NOTE — Patient Instructions (Addendum)
Take iron tablets - ferrous sulfate 325mg  daily with meal and vitamin C, if tolerated not upset stomach can increase to twice daily with meals in few days to weeks, take this until you return to see me.  Start OTC Vitamin D3 5,000 iu daily for 12 weeks then reduce to OTC Vitamin D3 2,000 iu daily for maintenance  Keep Synthroid dose the same - no change, we may have to adjust the time of your dose in future.  DUE for FASTING BLOOD WORK (no food or drink after midnight before the lab appointment, only water or coffee without cream/sugar on the morning of)  SCHEDULE "Lab Only" visit in the morning at the clinic for lab draw in 3 MONTHS   - Make sure Lab Only appointment is at about 1 week before your next appointment, so that results will be available  For Lab Results, once available within 2-3 days of blood draw, you can can log in to MyChart online to view your results and a brief explanation. Also, we can discuss results at next follow-up visit.   Please schedule a Follow-up Appointment to: Return in about 3 months (around 05/29/2019) for 3 months  Lab results / Anemia, PreDM, Thyroid.  If you have any other questions or concerns, please feel free to call the office or send a message through MyChart. You may also schedule an earlier appointment if necessary.  Additionally, you may be receiving a survey about your experience at our office within a few days to 1 week by e-mail or mail. We value your feedback.  Saralyn Pilar, DO [facility], [place]

## 2019-02-26 NOTE — Assessment & Plan Note (Signed)
Low Vitamin D, mild Suspected contributing to symptoms Stop 1k iu daily Start OTC Vitamin D3 5,000 iu daily for 12 weeks then reduce to OTC Vitamin D3 2,000 iu daily for maintenance

## 2019-02-26 NOTE — Assessment & Plan Note (Signed)
Clinically possible mild fatigue/tired - otherwise asymptomatic Now with elevated TSH, confusing as previously had LOW TSH x 2 on same dose Normal Free T4 Taking Synthroid some dose timing changes due to lifestyle change w/ coronavirus pandemic Chronic issue post-surgical thyroidectomy due to nodules/goiter  Plan Discussed options - agree to still CONTINUE Synthroid brand daily - agree not to adjust based on slightly high TSH, bc normal Free T4 - we think that likely symptoms from iron instead - Follow-up 3 months labs for thyroid

## 2019-04-05 ENCOUNTER — Encounter: Payer: Self-pay | Admitting: Family Medicine

## 2019-04-05 ENCOUNTER — Other Ambulatory Visit: Payer: Self-pay

## 2019-04-05 ENCOUNTER — Ambulatory Visit (INDEPENDENT_AMBULATORY_CARE_PROVIDER_SITE_OTHER): Payer: Medicare Other | Admitting: Family Medicine

## 2019-04-05 DIAGNOSIS — M25511 Pain in right shoulder: Secondary | ICD-10-CM

## 2019-04-05 DIAGNOSIS — M7551 Bursitis of right shoulder: Secondary | ICD-10-CM | POA: Diagnosis not present

## 2019-04-05 DIAGNOSIS — D508 Other iron deficiency anemias: Secondary | ICD-10-CM

## 2019-04-05 DIAGNOSIS — E039 Hypothyroidism, unspecified: Secondary | ICD-10-CM | POA: Diagnosis not present

## 2019-04-05 DIAGNOSIS — G8929 Other chronic pain: Secondary | ICD-10-CM

## 2019-04-05 MED ORDER — FERROUS GLUCONATE 324 (38 FE) MG PO TABS: 324.0000 mg | ORAL_TABLET | Freq: Every day | ORAL | 0 refills | Status: DC

## 2019-04-05 NOTE — Progress Notes (Signed)
Virtual Visit via Telephone The purpose of this virtual visit is to provide medical care while limiting exposure to the novel coronavirus (COVID19) for both patient and office staff.  Consent was obtained for phone visit:  Yes.   Answered questions that patient had about telehealth interaction:  Yes.   I discussed the limitations, risks, security and privacy concerns of performing an evaluation and management service by telephone. I also discussed with the patient that there may be a patient responsible charge related to this service. The patient expressed understanding and agreed to proceed.  Patient Location: Home [facility]  ---------------------------------------------------------------------- Chief Complaint  Patient presents with  . Shoulder Pain    right side elbow to shoulder ongoing pain getting worst onset 03/20    S: Reviewed CMA documentation. I have called patient and gathered additional HPI as follows:  FOLLOW-UP RightShoulderBursitis, Chronic - Last visit with me 12/2018, for same problem Right shoulder pain and bursitis, treated with trial on other muscle relaxant flexeril, steroid injection and X-ray, see prior notes for background information. - Interval update with temporary improved for about 1-2 weeks after injection significant improve but then return of pain and now worsening.  Today patient describes persistent R shoulder pain, worse with activity, unable to lift above shoulder/head and difficulty in doing most tasks involve using Right shoulder and lifting Overhead activities worse - Similar past history of problem in Left shoulder, saw [facility], Dr Martha ClanKrasinski, had steroid injection and PT for 1 month, with complete resolution of the problem, has not returned since, that was few year ago - Today she is requesting referral to return to Ortho - Denies fall or injury or trauma, worsening numbness tingling  Additional  follow-up items today:  FOLLOW-UPHypothyroidism - Last visit with mefor this issue 01/2019 last lab was then elevated TSH 9.26, we agreed to keep same dose since she had normal Free T4. She was to continue Synthroid brand 175mcg daily. - Today still having same issues feels tired, not sure if this is anemia or sleep or pain from shoulder Denies temperature instability, edema, hair or skin changes  Anemia, iron deficiency, chronic mild Long history of prior anemia, reported in past with required iron treatment Last lab Hgb 12, and borderline low Ferritin 13, otherwise mostly normal anemia panel. She also has history of Vitamin B12 deficiency, in past she received B12 injections for energy. She was advised last time to try ferrous sulfate OTC - but ultimately she was having possible reaction to this medicine, she has sulfur/sulfa allergy, and questioned this med OTC. She has taken other OTC iron before and asking about other options. - She was asked to get Colonoscopy for screening, it was delayed by [facility] when anticipated in February / March 2020 due to coronavirus, she is waiting on it to be re-scheduled    Denies any fevers, chills, sweats, body ache, cough, shortness of breath, sinus pain or pressure, headache, abdominal pain, diarrhea  Past Medical History:  Diagnosis Date  . Allergy   . Anemia   . Asthma    Social History   Tobacco Use  . Smoking status: Never Smoker  . Smokeless tobacco: Never Used  Substance Use Topics  . Alcohol use: Never    Frequency: Never  . Drug use: Never    Current Outpatient Medications:  .  ALBUTEROL IN, Inhale into the lungs as needed., Disp: , Rfl:  .  Cholecalciferol (VITAMIN D3) 125 MCG (5000 UT) CAPS,  Take 1 capsule (5,000 Units total) by mouth daily. For 8 weeks, then start Vitamin D3 2,000 units daily (OTC), Disp: 60 capsule, Rfl: 0 .  cyclobenzaprine (FLEXERIL) 10 MG tablet, Take 1 tablet (10 mg total) by mouth 2 (two) times daily as  needed for muscle spasms., Disp: 30 tablet, Rfl: 1 .  EPINEPHrine (EPIPEN IJ), Inject as directed as needed., Disp: , Rfl:  .  montelukast (SINGULAIR) 10 MG tablet, Take 1 tablet (10 mg total) by mouth at bedtime as needed., Disp: 90 tablet, Rfl: 3 .  SYNTHROID 175 MCG tablet, Take 1 tablet (175 mcg total) by mouth daily before breakfast., Disp: 30 tablet, Rfl: 5 .  ferrous gluconate (FERGON) 324 MG tablet, Take 1 tablet (324 mg total) by mouth daily with breakfast., Disp: , Rfl: 0  [facility] 04/05/2019 02/26/2019 01/04/2019  Decreased Interest 1 0 0  Down, Depressed, Hopeless 2 0 0  PHQ - 2 Score 3 0 0  Altered sleeping 3 - -  Tired, decreased energy 3 - -  Change in appetite 2 - -  Feeling bad or failure about yourself  2 - -  Trouble concentrating 1 - -  Moving slowly or fidgety/restless 2 - -  Suicidal thoughts 0 - -  PHQ-9 Score 16 - -  Difficult doing work/chores Somewhat difficult - -    No flowsheet data found.  -------------------------------------------------------------------------- O: No physical exam performed due to remote telephone encounter.  Lab results reviewed.  I have personally reviewed the radiology report from 01/04/19.  Study Result CLINICAL DATA: Right shoulder pain for 2 months. No known injury.  EXAM: RIGHT SHOULDER - 2+ VIEW  COMPARISON: None.  FINDINGS: There is no evidence of fracture or dislocation. There is no evidence of arthropathy or other focal bone abnormality. Soft tissues are unremarkable.  IMPRESSION: Negative exam.   Electronically Signed By: Drusilla Kannerhomas Dalessio M.D. On: 01/05/2019 09:08   Recent Results (from the past 2160 hour(s))  COMPLETE METABOLIC PANEL WITH GFR     Status: Abnormal   Collection Time: 02/23/19  8:29 AM  Result Value Ref Range   Glucose, Bld 111 (H) 65 - 99 mg/dL    Comment: .            Fasting reference interval . For someone without known diabetes, a glucose value between 100 and 125  mg/dL is consistent with prediabetes and should be confirmed with a follow-up test. .    BUN 16 7 - 25 mg/dL   Creat 1.610.81 0.960.50 - 0.450.99 mg/dL    Comment: For patients >65 years of age, the reference limit for Creatinine is approximately 13% higher for people identified as African-American. .    GFR, Est Non African American 77 > OR = 60 mL/min/1.1473m2   GFR, Est African American 89 > OR = 60 mL/min/1.4373m2   BUN/Creatinine Ratio NOT APPLICABLE 6 - 22 (calc)   Sodium 141 135 - 146 mmol/L   Potassium 5.0 3.5 - 5.3 mmol/L   Chloride 105 98 - 110 mmol/L   CO2 29 20 - 32 mmol/L   Calcium 9.3 8.6 - 10.4 mg/dL   Total Protein 7.1 6.1 - 8.1 g/dL   Albumin 3.8 3.6 - 5.1 g/dL   Globulin 3.3 1.9 - 3.7 g/dL (calc)   AG Ratio 1.2 1.0 - 2.5 (calc)   Total Bilirubin 0.5 0.2 - 1.2 mg/dL   Alkaline phosphatase (APISO) 71 37 - 153 U/L   AST 13 10 - 35 U/L  ALT 9 6 - 29 U/L    Comment: Verified by repeat analysis. .   T4, free     Status: None   Collection Time: 02/23/19  8:29 AM  Result Value Ref Range   Free T4 1.1 0.8 - 1.8 ng/dL  TSH     Status: Abnormal   Collection Time: 02/23/19  8:29 AM  Result Value Ref Range   TSH 9.26 (H) 0.40 - 4.50 mIU/L  Iron, TIBC and Ferritin Panel     Status: Abnormal   Collection Time: 02/23/19  8:29 AM  Result Value Ref Range   Iron 61 45 - 160 mcg/dL   TIBC 161 096 - 045 mcg/dL (calc)   %SAT 17 16 - 45 % (calc)   Ferritin 13 (L) 16 - 288 ng/mL  Vitamin B12     Status: None   Collection Time: 02/23/19  8:29 AM  Result Value Ref Range   Vitamin B-12 936 200 - 1,100 pg/mL  VITAMIN D 25 Hydroxy (Vit-D Deficiency, Fractures)     Status: Abnormal   Collection Time: 02/23/19  8:29 AM  Result Value Ref Range   Vit D, 25-Hydroxy 25 (L) 30 - 100 ng/mL    Comment: Vitamin D Status         25-OH Vitamin D: . Deficiency:                    <20 ng/mL Insufficiency:             20 - 29 ng/mL Optimal:                 > or = 30 ng/mL . For 25-OH Vitamin D  testing on patients on  D2-supplementation and patients for whom quantitation  of D2 and D3 fractions is required, the QuestAssureD(TM) 25-OH VIT D, (D2,D3), LC/MS/MS is recommended: order  code 40981 (patients >76yrs). . For more information on this test, go to: http://education.questdiagnostics.com/faq/FAQ163 (This link is being provided for  informational/educational purposes only.)   CBC with Differential/Platelet     Status: Abnormal   Collection Time: 02/23/19  8:29 AM  Result Value Ref Range   WBC 6.4 3.8 - 10.8 Thousand/uL   RBC 4.72 3.80 - 5.10 Million/uL   Hemoglobin 12.0 11.7 - 15.5 g/dL   HCT 19.1 47.8 - 29.5 %   MCV 79.7 (L) 80.0 - 100.0 fL   MCH 25.4 (L) 27.0 - 33.0 pg   MCHC 31.9 (L) 32.0 - 36.0 g/dL   RDW 62.1 (H) 30.8 - 65.7 %   Platelets 323 140 - 400 Thousand/uL   MPV 10.6 7.5 - 12.5 fL   Neutro Abs 3,552 1,500 - 7,800 cells/uL   Lymphs Abs 2,349 850 - 3,900 cells/uL   Absolute Monocytes 448 200 - 950 cells/uL   Eosinophils Absolute 32 15 - 500 cells/uL   Basophils Absolute 19 0 - 200 cells/uL   Neutrophils Relative % 55.5 %   Total Lymphocyte 36.7 %   Monocytes Relative 7.0 %   Eosinophils Relative 0.5 %   Basophils Relative 0.3 %  Hemoglobin A1c     Status: Abnormal   Collection Time: 02/23/19  8:29 AM  Result Value Ref Range   Hgb A1c MFr Bld 6.2 (H) <5.7 % of total Hgb    Comment: For someone without known diabetes, a hemoglobin  A1c value between 5.7% and 6.4% is consistent with prediabetes and should be confirmed with a  follow-up test. . For someone with  known diabetes, a value <7% indicates that their diabetes is well controlled. A1c targets should be individualized based on duration of diabetes, age, comorbid conditions, and other considerations. . This assay result is consistent with an increased risk of diabetes. . Currently, no consensus exists regarding use of hemoglobin A1c for diagnosis of diabetes for children. .    Mean Plasma  Glucose 131 (calc)   eAG (mmol/L) 7.3 (calc)    -------------------------------------------------------------------------- A&P:  Problem List Items Addressed This Visit    Hypothyroidism (acquired)    Still clinically possible mild fatigue/tired - otherwise asymptomatic Last lab 01/2019 elevated TSH and Normal Free T4 Chronic issue post-surgical thyroidectomy due to nodules/goiter  Plan Still agree to CONTINUE Synthroid brand 112mcg daily - agree not to adjust based on slightly high TSH, bc normal Free T4 - Follow-up 6 weeks labs for thyroid      Iron deficiency anemia    Clinically with mild fatigue and tired, similar to prior symptoms Known long history of mild anemia, recent labs reviewed - borderline low Hgb and MCV, anemia panel suggestive of mild low ferritin and Tsat iron stores/saturation  Plan - Discussed options, no other clinical concern right now - fairly stable except tired/fatigue, can be multifactorial, also discussed OSA previously - declined sleep study due to financial, also hypothyroidism is factor and vitamin D  - STOP Ferrous sulfate due to sulfa allergy - possible allergic reaction vs intolerance - advised can switch to Ferrous Gluconate or other formula, notify me if questions about potential allergy - future consider refer to Heme and consider IV iron if indicated.  F/u 6 weeks repeat labs      Relevant Medications   ferrous gluconate (FERGON) 324 MG tablet    Other Visit Diagnoses    Chronic bursitis of right shoulder    -  Primary   Relevant Orders   Ambulatory referral to Orthopedic Surgery   Chronic right shoulder pain       Relevant Orders   Ambulatory referral to Orthopedic Surgery    R shoulder limited improvement s/p steroid injection, X-ray was unremarkable, and other medicines muscle relaxant limited benefit  Referral to [facility] to return to care to Dr Mack Guise (previously treated Left shoulder in approx 2015), now referred for  Chronic Right Shoulder pain vs chronic bursitis, treated with muscle relaxant, steroid injection only temporary relief 1-2 weeks, had X-ray on file that was negative for DJD or acute injury 01/04/19 and now worsening overall >6 months without resolution, requesting return to Orthopedics  Meds ordered this encounter  Medications  . ferrous gluconate (FERGON) 324 MG tablet    Sig: Take 1 tablet (324 mg total) by mouth daily with breakfast.    Refill:  0   Orders Placed This Encounter  Procedures  . Ambulatory referral to Orthopedic Surgery    Referral Priority:   Routine    Referral Type:   Surgical    Referral Reason:   Specialty Services Required    Requested Specialty:   Orthopedic Surgery    Number of Visits Requested:   1     Follow-up: - Return in 6 weeks Fatigue / Anemia / f/u Ortho - Future labs already ordered previously for TSH, iron panel, CBC, A1c, Vit D  Patient verbalizes understanding with the above medical recommendations including the limitation of remote medical advice.  Specific follow-up and call-back criteria were given for patient to follow-up or seek medical care more urgently if needed.   - Time  spent in direct consultation with patient on phone: 12 minutes   Saralyn PilarAlexander Ousmane Seeman, DO [facility] 04/05/2019, 2:15 PM

## 2019-04-05 NOTE — Patient Instructions (Addendum)
Referral sent to [facility] - stay tuned for apt  [facility] (formerly [facility]) Address: [address], [place], [place] [address] Hours:  9AM-5PM Phone: [phone]  Timoteo Gaul, MD   ----  Please call [facility] to re-schedule your colonoscopy Phone: [phone]   Keep on thyroid dose for now  Switch iron from sulfate over to Ferrous gluconate - due to allergy concern.  Labs ordered to re-check thyroid and iron panel  DUE for FASTING BLOOD WORK (no food or drink after midnight before the lab appointment, only water or coffee without cream/sugar on the morning of)  SCHEDULE "Lab Only" visit in the morning at the clinic for lab draw in [place]   - Make sure Lab Only appointment is at about 1 week before your next appointment, so that results will be available  For Lab Results, once available within 2-3 days of blood draw, you can can log in to MyChart online to view your results and a brief explanation. Also, we can discuss results at next follow-up visit.    Please schedule a Follow-up Appointment to: No follow-ups on file.  If you have any other questions or concerns, please feel free to call the office or send a message through [place]. You may also schedule an earlier appointment if necessary.  Additionally, you may be receiving a survey about your experience at our office within a few days to 1 week by e-mail or mail. We value your feedback.  Nobie Putnam, DO [place]

## 2019-04-05 NOTE — Assessment & Plan Note (Signed)
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

## 2019-04-05 NOTE — Assessment & Plan Note (Signed)
Clinically with mild fatigue and tired, similar to prior symptoms Known long history of mild anemia, recent labs reviewed - borderline low Hgb and MCV, anemia panel suggestive of mild low ferritin and Tsat iron stores/saturation  Plan - Discussed options, no other clinical concern right now - fairly stable except tired/fatigue, can be multifactorial, also discussed OSA previously - declined sleep study due to financial, also hypothyroidism is factor and vitamin D  - STOP Ferrous sulfate due to sulfa allergy - possible allergic reaction vs intolerance - advised can switch to Ferrous Gluconate or other formula, notify me if questions about potential allergy - future consider refer to Heme and consider IV iron if indicated.  F/u 6 weeks repeat labs

## 2019-04-15 DIAGNOSIS — M25511 Pain in right shoulder: Secondary | ICD-10-CM | POA: Insufficient documentation

## 2019-06-30 ENCOUNTER — Other Ambulatory Visit: Payer: Self-pay | Admitting: Family Medicine

## 2019-06-30 DIAGNOSIS — E039 Hypothyroidism, unspecified: Secondary | ICD-10-CM

## 2019-07-09 ENCOUNTER — Other Ambulatory Visit: Payer: Self-pay

## 2019-07-09 ENCOUNTER — Other Ambulatory Visit: Payer: Medicare Other

## 2019-07-09 DIAGNOSIS — E039 Hypothyroidism, unspecified: Secondary | ICD-10-CM

## 2019-07-09 DIAGNOSIS — D508 Other iron deficiency anemias: Secondary | ICD-10-CM

## 2019-07-09 DIAGNOSIS — R7303 Prediabetes: Secondary | ICD-10-CM

## 2019-07-09 DIAGNOSIS — E559 Vitamin D deficiency, unspecified: Secondary | ICD-10-CM

## 2019-07-10 LAB — CBC WITH DIFFERENTIAL/PLATELET
Absolute Monocytes: 452 cells/uL (ref 200–950)
Basophils Absolute: 17 cells/uL (ref 0–200)
Basophils Relative: 0.3 %
Eosinophils Absolute: 52 cells/uL (ref 15–500)
Eosinophils Relative: 0.9 %
HCT: 37.1 % (ref 35.0–45.0)
Hemoglobin: 11.5 g/dL — ABNORMAL LOW (ref 11.7–15.5)
Lymphs Abs: 2349 cells/uL (ref 850–3900)
MCH: 25.2 pg — ABNORMAL LOW (ref 27.0–33.0)
MCHC: 31 g/dL — ABNORMAL LOW (ref 32.0–36.0)
MCV: 81.4 fL (ref 80.0–100.0)
MPV: 9.8 fL (ref 7.5–12.5)
Monocytes Relative: 7.8 %
Neutro Abs: 2929 cells/uL (ref 1500–7800)
Neutrophils Relative %: 50.5 %
Platelets: 335 10*3/uL (ref 140–400)
RBC: 4.56 10*6/uL (ref 3.80–5.10)
RDW: 15 % (ref 11.0–15.0)
Total Lymphocyte: 40.5 %
WBC: 5.8 10*3/uL (ref 3.8–10.8)

## 2019-07-10 LAB — VITAMIN D 25 HYDROXY (VIT D DEFICIENCY, FRACTURES): Vit D, 25-Hydroxy: 29 ng/mL — ABNORMAL LOW (ref 30–100)

## 2019-07-10 LAB — IRON,TIBC AND FERRITIN PANEL
%SAT: 14 % (calc) — ABNORMAL LOW (ref 16–45)
Ferritin: 16 ng/mL (ref 16–288)
Iron: 47 ug/dL (ref 45–160)
TIBC: 345 mcg/dL (calc) (ref 250–450)

## 2019-07-10 LAB — TSH: TSH: 0.1 mIU/L — ABNORMAL LOW (ref 0.40–4.50)

## 2019-07-10 LAB — HEMOGLOBIN A1C
Hgb A1c MFr Bld: 6.3 % of total Hgb — ABNORMAL HIGH (ref <5.7)
Mean Plasma Glucose: 134 (calc)
eAG (mmol/L): 7.4 (calc)

## 2019-07-10 LAB — T4, FREE: Free T4: 1.4 ng/dL (ref 0.8–1.8)

## 2019-07-12 ENCOUNTER — Other Ambulatory Visit: Payer: Medicare Other

## 2019-07-13 ENCOUNTER — Telehealth: Payer: Self-pay | Admitting: Family Medicine

## 2019-07-13 DIAGNOSIS — E039 Hypothyroidism, unspecified: Secondary | ICD-10-CM

## 2019-07-13 NOTE — Telephone Encounter (Signed)
Called patient.  She still feels significant fatigue tired cold and hair loss, dry skin. She has hypothyroid symptoms.  She is asking if need change dose  I advised her I am concerned because her lab shows opposite, shows too strong of thyroid medicine but technically normal hormone T4 level.  I advised she can pick up and continue current dose Synthroid 171mcg daily. No change. rx was sent 9/2  I do not think her symptoms are due to anemia, as reviewed lab results.  Next I advised option would be would refer to Endocrinology THyroid specialist - prefer local option, I will place referral today to [facility] for further evaluation of her thyroid. She does state she has had issue with goiter in past as well.  She will follow-up with them, and advised her to notify us if any acute or more dramatic worsening, possibility that we need to expand our search outside of anemia and thyroid.  Amanda Pierce, [facility] 07/13/2019, 6:23 PM

## 2019-07-13 NOTE — Telephone Encounter (Signed)
Pt requesting  lab results, also wanted to know if she need to change her medication before she pick up a 90 day supply of medation

## 2019-08-05 LAB — HM MAMMOGRAPHY

## 2019-10-08 ENCOUNTER — Telehealth: Payer: Self-pay

## 2019-10-08 NOTE — Telephone Encounter (Signed)
I have a feeling Dr. Raliegh Ip is going to want to recheck her thyroid- so I would not cancel that appt, but I will leave this in his box for his final approval.

## 2019-10-08 NOTE — Telephone Encounter (Signed)
The pt has a physical lab appt scheduled for 10/11/2019. It look as if she had these labs done 06/2019. Please advise if the lab appt is even necessary.

## 2019-10-11 ENCOUNTER — Other Ambulatory Visit: Payer: Self-pay | Admitting: Family Medicine

## 2019-10-11 ENCOUNTER — Other Ambulatory Visit: Payer: Medicare Other

## 2019-10-11 ENCOUNTER — Other Ambulatory Visit: Payer: Self-pay

## 2019-10-11 DIAGNOSIS — D508 Other iron deficiency anemias: Secondary | ICD-10-CM

## 2019-10-11 DIAGNOSIS — E039 Hypothyroidism, unspecified: Secondary | ICD-10-CM

## 2019-10-11 NOTE — Telephone Encounter (Signed)
Ordered labs.  Anemia panel and TSH Free T4  Nobie Putnam, DO [facility] 10/11/2019, 10:29 AM

## 2019-10-12 LAB — CBC WITH DIFFERENTIAL/PLATELET
Absolute Monocytes: 404 cells/uL (ref 200–950)
Basophils Absolute: 9 cells/uL (ref 0–200)
Basophils Relative: 0.2 %
Eosinophils Absolute: 52 cells/uL (ref 15–500)
Eosinophils Relative: 1.1 %
HCT: 36.7 % (ref 35.0–45.0)
Hemoglobin: 11.7 g/dL (ref 11.7–15.5)
Lymphs Abs: 2021 cells/uL (ref 850–3900)
MCH: 25.4 pg — ABNORMAL LOW (ref 27.0–33.0)
MCHC: 31.9 g/dL — ABNORMAL LOW (ref 32.0–36.0)
MCV: 79.8 fL — ABNORMAL LOW (ref 80.0–100.0)
MPV: 10.5 fL (ref 7.5–12.5)
Monocytes Relative: 8.6 %
Neutro Abs: 2214 cells/uL (ref 1500–7800)
Neutrophils Relative %: 47.1 %
Platelets: 322 10*3/uL (ref 140–400)
RBC: 4.6 10*6/uL (ref 3.80–5.10)
RDW: 15.5 % — ABNORMAL HIGH (ref 11.0–15.0)
Total Lymphocyte: 43 %
WBC: 4.7 10*3/uL (ref 3.8–10.8)

## 2019-10-12 LAB — IRON,TIBC AND FERRITIN PANEL
%SAT: 9 % (calc) — ABNORMAL LOW (ref 16–45)
Ferritin: 7 ng/mL — ABNORMAL LOW (ref 16–288)
Iron: 29 ug/dL — ABNORMAL LOW (ref 45–160)
TIBC: 332 mcg/dL (calc) (ref 250–450)

## 2019-10-12 LAB — T4, FREE: Free T4: 1 ng/dL (ref 0.8–1.8)

## 2019-10-12 LAB — TSH: TSH: 5.3 mIU/L — ABNORMAL HIGH (ref 0.40–4.50)

## 2019-10-13 ENCOUNTER — Other Ambulatory Visit: Payer: Self-pay

## 2019-10-13 ENCOUNTER — Encounter: Payer: Self-pay | Admitting: Family Medicine

## 2019-10-13 ENCOUNTER — Ambulatory Visit (INDEPENDENT_AMBULATORY_CARE_PROVIDER_SITE_OTHER): Payer: Medicare Other | Admitting: Family Medicine

## 2019-10-13 VITALS — BP 143/81 | HR 73 | Temp 97.7°F | Resp 16 | Ht 68.0 in | Wt 232.0 lb

## 2019-10-13 DIAGNOSIS — Z0000 Encounter for general adult medical examination without abnormal findings: Secondary | ICD-10-CM | POA: Diagnosis not present

## 2019-10-13 DIAGNOSIS — R7303 Prediabetes: Secondary | ICD-10-CM

## 2019-10-13 DIAGNOSIS — E7800 Pure hypercholesterolemia, unspecified: Secondary | ICD-10-CM

## 2019-10-13 DIAGNOSIS — E669 Obesity, unspecified: Secondary | ICD-10-CM | POA: Diagnosis not present

## 2019-10-13 DIAGNOSIS — R030 Elevated blood-pressure reading, without diagnosis of hypertension: Secondary | ICD-10-CM

## 2019-10-13 DIAGNOSIS — D508 Other iron deficiency anemias: Secondary | ICD-10-CM | POA: Diagnosis not present

## 2019-10-13 DIAGNOSIS — E039 Hypothyroidism, unspecified: Secondary | ICD-10-CM

## 2019-10-13 DIAGNOSIS — E559 Vitamin D deficiency, unspecified: Secondary | ICD-10-CM

## 2019-10-13 NOTE — Progress Notes (Addendum)
Subjective:    Patient ID: Amanda Pierce, female    DOB: 12-31-1955, 63 y.o.   MRN: [id number]  Amanda Pierce is a 63 y.o. female presenting on 10/13/2019 for Annual Exam   HPI  Here for Annual Physical and Lab review.  FOLLOW-UPHypothyroidism Prior follow up hypothyroidism TSH was low then back up to 5.30 now on lab. She had prior 2.4 range TSH before this most recent lab. She had been referred to Endocrinology. [facility]. They reduced her dose from 175 down to 150 Currently doing well and will follow back up with them Denies temperature instability, edema, hair or skin changes  Pre-Diabetes / Hyperlipidemia / Obesity BMI >35 Recent lab 6.2 to 6.3 Meds:not on medication Lifestyle: - Diet (balanced, admits still improving diet) Denies hypoglycemia   Additional complaint  Anemia, iron deficiency, chronic mild Long history of prior anemia, reported in past with required iron treatment Previous lab course, she improved iron stores on oral ferrous gluconate supplement then stopped this once her results improved, now lab again 09/2019 showed reduced iron stores, ferritin low 7 and Tsat% 9  She also has history of Vitamin B12 deficiency, in past she received B12 injections for energy. She was asked to get Colonoscopy for screening, it was delayed by [facility] when anticipated in February / March 2020 due to coronavirus, she is waiting on it to be re-scheduled  In past previous IV iron infusion in 1990s She is interested to see HEmatologist now   Health Maintenance:  Due for initial pneumonia vaccine at age 16 - will receive Prevnar-13 in 2021 early when she returns.  Depression screen [facility] 2/9 10/13/2019 04/05/2019 02/26/2019  Decreased Interest 0 1 0  Down, Depressed, Hopeless 0 2 0  PHQ - 2 Score 0 3 0  Altered sleeping - 3 -  Tired, decreased energy - 3 -  Change in appetite - 2 -  Feeling bad or failure about yourself  - 2 -  Trouble concentrating - 1 -  Moving slowly or  fidgety/restless - 2 -  Suicidal thoughts - 0 -  PHQ-9 Score - 16 -  Difficult doing work/chores - Somewhat difficult -    Past Medical History:  Diagnosis Date  . Allergy   . Anemia   . Asthma    Past Surgical History:  Procedure Laterality Date  . BREAST MASS EXCISION Left 1974   benign  . PARATHYROIDECTOMY  1982   Social History   Socioeconomic History  . Marital status: Married    Spouse name: Not on file  . Number of children: Not on file  . Years of education: Not on file  . Highest education level: Not on file  Occupational History  . Not on file  Tobacco Use  . Smoking status: Never Smoker  . Smokeless tobacco: Never Used  Substance and Sexual Activity  . Alcohol use: Never  . Drug use: Never  . Sexual activity: Not on file  Other Topics Concern  . Not on file  Social History Narrative  . Not on file   Social Determinants of Health   Financial Resource Strain:   . Difficulty of Paying Living Expenses: Not on file  Food Insecurity:   . Worried About Charity fundraiser in the Last Year: Not on file  . Ran Out of Food in the Last Year: Not on file  Transportation Needs:   . Lack of Transportation (Medical): Not on file  . Lack of Transportation (Non-Medical): Not on file  Physical Activity:   . Days of Exercise per Week: Not on file  . Minutes of Exercise per Session: Not on file  Stress:   . Feeling of Stress : Not on file  Social Connections:   . Frequency of Communication with Friends and Family: Not on file  . Frequency of Social Gatherings with Friends and Family: Not on file  . Attends Religious Services: Not on file  . Active Member of Clubs or Organizations: Not on file  . Attends BankerClub or Organization Meetings: Not on file  . Marital Status: Not on file  Intimate Partner Violence:   . Fear of Current or Ex-Partner: Not on file  . Emotionally Abused: Not on file  . Physically Abused: Not on file  . Sexually Abused: Not on file   Family  History  Problem Relation Age of Onset  . Cancer Mother        Breast, Paget's removed nipple 891, breast 2001  . Colon polyps Mother   . Heart disease Father   . Heart attack Father   . Heart disease Maternal Grandfather   . Stroke Paternal Grandmother   . Colon cancer Neg Hx    Current Outpatient Medications on File Prior to Visit  Medication Sig  . ALBUTEROL IN Inhale into the lungs as needed.  . Cholecalciferol (VITAMIN D3) 125 MCG (5000 UT) CAPS Take 1 capsule (5,000 Units total) by mouth daily. For 8 weeks, then start Vitamin D3 2,000 units daily (OTC)  . cyclobenzaprine (FLEXERIL) 10 MG tablet Take 1 tablet (10 mg total) by mouth 2 (two) times daily as needed for muscle spasms.  Marland Kitchen. EPINEPHrine (EPIPEN IJ) Inject as directed as needed.  . ferrous gluconate (FERGON) 324 MG tablet Take 1 tablet (324 mg total) by mouth daily with breakfast.  . montelukast (SINGULAIR) 10 MG tablet Take 1 tablet (10 mg total) by mouth at bedtime as needed.  Marland Kitchen. SYNTHROID 150 MCG tablet Take 1 tablet (150 mcg total) by mouth daily before breakfast.   No current facility-administered medications on file prior to visit.    Review of Systems  Constitutional: Positive for fatigue. Negative for activity change, appetite change, chills, diaphoresis and fever.  HENT: Negative for congestion and hearing loss.   Eyes: Negative for visual disturbance.  Respiratory: Negative for apnea, cough, chest tightness, shortness of breath and wheezing.   Cardiovascular: Negative for chest pain, palpitations and leg swelling.  Gastrointestinal: Negative for abdominal pain, anal bleeding, blood in stool, constipation, diarrhea, nausea and vomiting.  Endocrine: Negative for cold intolerance.  Genitourinary: Negative for difficulty urinating, dysuria, frequency and hematuria.  Musculoskeletal: Negative for arthralgias, back pain and neck pain.  Skin: Negative for rash.  Allergic/Immunologic: Negative for environmental  allergies.  Neurological: Negative for dizziness, weakness, light-headedness, numbness and headaches.  Hematological: Negative for adenopathy.  Psychiatric/Behavioral: Negative for behavioral problems, dysphoric mood and sleep disturbance. The patient is not nervous/anxious.    Per HPI unless specifically indicated above      Objective:    BP (!) 143/81   Pulse 73   Temp 97.7 F (36.5 C) (Oral)   Resp 16   Ht 5\' 8"  (1.727 m)   Wt 232 lb (105.2 kg)   BMI 35.28 kg/m   Wt Readings from Last 3 Encounters:  10/13/19 232 lb (105.2 kg)  01/04/19 228 lb (103.4 kg)  12/07/18 229 lb 9.6 oz (104.1 kg)    Physical Exam Vitals and nursing note reviewed.  Constitutional:  General: She is not in acute distress.    Appearance: She is well-developed. She is not diaphoretic.     Comments: Well-appearing, comfortable, cooperative  HENT:     Head: Normocephalic and atraumatic.  Eyes:     General:        Right eye: No discharge.        Left eye: No discharge.     Conjunctiva/sclera: Conjunctivae normal.     Pupils: Pupils are equal, round, and reactive to light.  Neck:     Thyroid: No thyromegaly.  Cardiovascular:     Rate and Rhythm: Normal rate and regular rhythm.     Heart sounds: Normal heart sounds. No murmur.  Pulmonary:     Effort: Pulmonary effort is normal. No respiratory distress.     Breath sounds: Normal breath sounds. No wheezing or rales.  Abdominal:     General: Bowel sounds are normal. There is no distension.     Palpations: Abdomen is soft. There is no mass.     Tenderness: There is no abdominal tenderness.  Musculoskeletal:        General: No tenderness. Normal range of motion.     Cervical back: Normal range of motion and neck supple.     Comments: Upper / Lower Extremities: - Normal muscle tone, strength bilateral upper extremities 5/5, lower extremities 5/5  Lymphadenopathy:     Cervical: No cervical adenopathy.  Skin:    General: Skin is warm and dry.       Findings: No erythema or rash.  Neurological:     Mental Status: She is alert and oriented to person, place, and time.     Comments: Distal sensation intact to light touch all extremities  Psychiatric:        Behavior: Behavior normal.     Comments: Well groomed, good eye contact, normal speech and thoughts    Results for orders placed or performed in visit on 10/11/19  [facility] - CBC with Differential/Platelet physical  Result Value Ref Range   WBC 4.7 3.8 - 10.8 Thousand/uL   RBC 4.60 3.80 - 5.10 Million/uL   Hemoglobin 11.7 11.7 - 15.5 g/dL   HCT 99.2 42.6 - 83.4 %   MCV 79.8 (L) 80.0 - 100.0 fL   MCH 25.4 (L) 27.0 - 33.0 pg   MCHC 31.9 (L) 32.0 - 36.0 g/dL   RDW 19.6 (H) 22.2 - 97.9 %   Platelets 322 140 - 400 Thousand/uL   MPV 10.5 7.5 - 12.5 fL   Neutro Abs 2,214 1,500 - 7,800 cells/uL   Lymphs Abs 2,021 850 - 3,900 cells/uL   Absolute Monocytes 404 200 - 950 cells/uL   Eosinophils Absolute 52 15 - 500 cells/uL   Basophils Absolute 9 0 - 200 cells/uL   Neutrophils Relative % 47.1 %   Total Lymphocyte 43.0 %   Monocytes Relative 8.6 %   Eosinophils Relative 1.1 %   Basophils Relative 0.2 %  [facility] - Iron Panel Fe+TIBC+Fer  Result Value Ref Range   Iron 29 (L) 45 - 160 mcg/dL   TIBC 892 119 - 417 mcg/dL (calc)   %SAT 9 (L) 16 - 45 % (calc)   Ferritin 7 (L) 16 - 288 ng/mL  [facility] - TSH  Result Value Ref Range   TSH 5.30 (H) 0.40 - 4.50 mIU/L  [facility] - T4, free  Result Value Ref Range   Free T4 1.0 0.8 - 1.8 ng/dL      Assessment & Plan:  Problem List Items Addressed This Visit    Vitamin D deficiency   Pure hypercholesterolemia   Pre-diabetes   Obesity (BMI 35.0-39.9 without comorbidity)   Iron deficiency anemia   Relevant Orders   Ambulatory referral to Hematology   Hypothyroidism (acquired)   Relevant Medications   SYNTHROID 150 MCG tablet   Elevated BP without diagnosis of hypertension    Other Visit Diagnoses    Annual physical exam    -  Primary       #Vitamin D Stable result, 29 Continue supplement  #PreDM / obesity Stable, within range, goal < 6.5 Encourage lifestyle diet exercise limit carb starches Not on medicine  #Elevated BP Prior readings normal Will monitor BP closely and follow-up  Updated Health Maintenance information Reviewed recent lab results with patient Encouraged improvement to lifestyle with diet and exercise - Goal of weight loss   #Anemia Subacute on Chronic problem Restart Ferrous gluconate for now Referral to [facility]emat[facility] for evaluation, may warrant IV iron infusion therapy  Orders Placed This Encounter  Procedures  . Ambulatory referral to Hematology    Referral Priority:   Routine    Referral Type:   Consultation    Referral Reason:   Specialty Services Required    Requested Specialty:   Oncology    Number of Visits Requested:   1     No orders of the defined types were placed in this encounter.     Follow up plan: Return in about 6 months (around 04/12/2020) for 6 month follow-up PreDM A1c, anemia thyroid / endocrine/hematology.  Saralyn Pilar, DO [facility] 10/13/2019, 2:51 PM

## 2019-10-13 NOTE — Patient Instructions (Addendum)
Thank you for coming to the office today.  Please schedule and return for a NURSE ONLY VISIT for VACCINE - approximately in 1 month - Need Prevnar-13 (initial Pneumonia Vaccine >age 65) - then 1 year due for Pneumovax-23 - 2nd final dose  Resume Ferrous gluconate iron supplement for now.  Referral to Hematology  [place] [facility] (Hematology/Oncology) [address], [place] [address] Phone: [phone]  Thyroid labs showed TSH slightly higher, but overall hormone level T4 is normal. Keep on current dose.  Keep track of BP readings Still slightly elevated top number 142 today. Bottom number is normal.  Goal is BP < 140/90. If consistently >145 on top number then we may need to reconsider a low dose BP medication. If bottom number is consistently >90 we can also consider this.  Please schedule a Follow-up Appointment to: Return in about 6 months (around 04/12/2020) for 6 month follow-up PreDM A1c, anemia thyroid / endocrine/hematology.  If you have any other questions or concerns, please feel free to call the office or send a message through [place]. You may also schedule an earlier appointment if necessary.  Additionally, you may be receiving a survey about your experience at our office within a few days to 1 week by e-mail or mail. We value your feedback.  Nobie Putnam, DO [place]

## 2019-10-18 ENCOUNTER — Encounter: Payer: Self-pay | Admitting: Gastroenterology

## 2019-10-18 ENCOUNTER — Other Ambulatory Visit: Payer: Self-pay

## 2019-10-18 ENCOUNTER — Ambulatory Visit: Payer: Medicare Other | Admitting: Gastroenterology

## 2019-10-18 VITALS — BP 123/86 | HR 90 | Temp 98.3°F | Ht 68.0 in | Wt 229.5 lb

## 2019-10-18 DIAGNOSIS — D509 Iron deficiency anemia, unspecified: Secondary | ICD-10-CM

## 2019-10-18 DIAGNOSIS — R1013 Epigastric pain: Secondary | ICD-10-CM | POA: Diagnosis not present

## 2019-10-18 DIAGNOSIS — D508 Other iron deficiency anemias: Secondary | ICD-10-CM | POA: Diagnosis not present

## 2019-10-18 MED ORDER — NA SULFATE-K SULFATE-MG SULF 17.5-3.13-1.6 GM/177ML PO SOLN: 354.0000 mL | Freq: Once | ORAL | 0 refills | Status: AC

## 2019-10-18 NOTE — Progress Notes (Signed)
Amanda Antigua, MD [address]  [place]  [place], [place] [address]  Main: [phone]  Fax: [phone]   Primary Care Physician: Olin Hauser, DO   Chief Complaint  Patient presents with  . Abdominal Pain    Patient states she is having epigastric abdominal pain. Patient has ha nausea and constipation and diarrhea.   . Colonoscopy    Patient last was 2015. Patient states she wants another one and a EGD     HPI: Amanda Pierce is a 63 y.o. female presents for iron deficiency anemia.  Previously also seen for epigastric abdominal pain a year ago, thought to be due to dyspepsia.  EGD was recommended but has not been done yet.  Screening colonoscopy was also recommended in the past as well.  Reports abdominal pain to be in the epigastric to left upper quadrant region.  Associated with nausea.  No vomiting.  Also has a bowel movement every other day and has to strain with it  No sources of bleeding.   Reports history of EGD 6 to 7 years ago and reports history of hiatal hernia.  Also reports colonoscopy 6 to 7 years ago and denies any polyps at the time.  No reports available to [place]  Current Outpatient Medications  Medication Sig Dispense Refill  . ALBUTEROL IN Inhale into the lungs as needed.    . Cholecalciferol (VITAMIN D3) 125 MCG (5000 UT) CAPS Take 1 capsule (5,000 Units total) by mouth daily. For 8 weeks, then start Vitamin D3 2,000 units daily (OTC) 60 capsule 0  . EPINEPHrine (EPIPEN IJ) Inject as directed as needed.    . ferrous gluconate (FERGON) 324 MG tablet Take 1 tablet (324 mg total) by mouth daily with breakfast.  0  . montelukast (SINGULAIR) 10 MG tablet Take 1 tablet (10 mg total) by mouth at bedtime as needed. 90 tablet 3  . SYNTHROID 150 MCG tablet Take 1 tablet (150 mcg total) by mouth daily before breakfast. 90 tablet 0   No current facility-administered medications for this visit.    Allergies as of 10/18/2019 - Review Complete  10/18/2019  Allergen Reaction Noted  . Aspirin Anaphylaxis 09/01/2018  . Contrast media [iodinated diagnostic agents] Nausea And Vomiting and Palpitations 09/01/2018  . Ibuprofen Anaphylaxis 09/01/2018  . Latex Rash 09/01/2018  . Penicillins Anaphylaxis 09/01/2018  . Rubbing alcohol [alcohol] Other (See Comments) 09/01/2018  . Sulfur Hives and Rash 09/01/2018    ROS:  General: Negative for anorexia, weight loss, fever, chills, fatigue, weakness. ENT: Negative for hoarseness, difficulty swallowing , nasal congestion. CV: Negative for chest pain, angina, palpitations, dyspnea on exertion, peripheral edema.  Respiratory: Negative for dyspnea at rest, dyspnea on exertion, cough, sputum, wheezing.  GI: See history of present illness. GU:  Negative for dysuria, hematuria, urinary incontinence, urinary frequency, nocturnal urination.  Endo: Negative for unusual weight change.    Physical Examination:   BP 123/86 (BP Location: Left Arm, Patient Position: Sitting, Cuff Size: Normal)   Pulse 90   Temp 98.3 F (36.8 C) (Oral)   Ht 5\' 8"  (1.727 m)   Wt 229 lb 8 oz (104.1 kg)   BMI 34.90 kg/m   General: Well-nourished, well-developed in no acute distress.  Eyes: No icterus. Conjunctivae pink. Mouth: Oropharyngeal mucosa moist and pink , no lesions erythema or exudate. Neck: Supple, Trachea midline Abdomen: Bowel sounds are normal, nontender, nondistended, no hepatosplenomegaly or masses, no abdominal bruits or hernia , no rebound or guarding.  Extremities: No lower extremity edema. No clubbing or deformities. Neuro: Alert and oriented x 3.  Grossly intact. Skin: Warm and dry, no jaundice.   Psych: Alert and cooperative, normal mood and affect.   Labs: CMP     Component Value Date/Time   NA 141 02/23/2019 0829   NA 140 09/29/2014 0000   K 5.0 02/23/2019 0829   CL 105 02/23/2019 0829   CO2 29 02/23/2019 0829   GLUCOSE 111 (H) 02/23/2019 0829   BUN 16 02/23/2019 0829   BUN 14  09/29/2014 0000   CREATININE 0.81 02/23/2019 0829   CALCIUM 9.3 02/23/2019 0829   PROT 7.1 02/23/2019 0829   AST 13 02/23/2019 0829   ALT 9 02/23/2019 0829   ALKPHOS 85 09/29/2014 0000   BILITOT 0.5 02/23/2019 0829   GFRNONAA 77 02/23/2019 0829   GFRAA 89 02/23/2019 0829   Lab Results  Component Value Date   WBC 4.7 10/11/2019   HGB 11.7 10/11/2019   HCT 36.7 10/11/2019   MCV 79.8 (L) 10/11/2019   PLT 322 10/11/2019    Imaging Studies: No results found.  Assessment and Plan:   Amanda Pierce is a 63 y.o. y/o female with iron deficiency anemia and epigastric pain  EGD and colonoscopy indicated for iron deficiency anemia  And obtain H. pylori testing during EGD due to epigastric pain  Discussed that she may need small bowel capsule study if EGD and colonoscopy are negative  Following with hematology with an upcoming appointment for iron deficiency   Dr Melodie Bouillon

## 2019-10-19 ENCOUNTER — Inpatient Hospital Stay: Payer: Medicare Other | Attending: Oncology | Admitting: Oncology

## 2019-10-19 ENCOUNTER — Other Ambulatory Visit: Payer: Self-pay

## 2019-10-19 ENCOUNTER — Encounter: Payer: Self-pay | Admitting: Oncology

## 2019-10-19 VITALS — BP 137/84 | HR 96 | Temp 98.0°F | Wt 229.8 lb

## 2019-10-19 DIAGNOSIS — Z803 Family history of malignant neoplasm of breast: Secondary | ICD-10-CM | POA: Insufficient documentation

## 2019-10-19 DIAGNOSIS — Z79899 Other long term (current) drug therapy: Secondary | ICD-10-CM | POA: Insufficient documentation

## 2019-10-19 DIAGNOSIS — D508 Other iron deficiency anemias: Secondary | ICD-10-CM | POA: Diagnosis not present

## 2019-10-19 DIAGNOSIS — R5383 Other fatigue: Secondary | ICD-10-CM | POA: Diagnosis not present

## 2019-10-19 DIAGNOSIS — E039 Hypothyroidism, unspecified: Secondary | ICD-10-CM | POA: Diagnosis not present

## 2019-10-19 DIAGNOSIS — D509 Iron deficiency anemia, unspecified: Secondary | ICD-10-CM | POA: Insufficient documentation

## 2019-10-19 MED ORDER — VITAMIN C 250 MG PO TABS: 500.0000 mg | ORAL_TABLET | Freq: Every day | ORAL | 1 refills | Status: DC

## 2019-10-19 MED ORDER — FERROUS GLUCONATE 324 (38 FE) MG PO TABS: 324.0000 mg | ORAL_TABLET | Freq: Every day | ORAL | 1 refills | Status: DC

## 2019-10-19 NOTE — Progress Notes (Signed)
Patient here for initial visit. States she is tired all the time. No abnormal bleeding.

## 2019-10-19 NOTE — Progress Notes (Signed)
Hematology/Oncology Consult note [facility] [phone]) [phone] [phone]   Patient Care Team: Smitty CordsKaramalegos, Alexander J, DO as PCP - General (Family Medicine)  REFERRING PROVIDER: Saralyn PilarKaramalegos, Alexander * CHIEF COMPLAINTS/REASON FOR VISIT:  Evaluation of iron deficiency anemia  HISTORY OF PRESENTING ILLNESS:  Amanda Pierce is a  63 y.o.  female with PMH listed below was seen in consultation at the request of Saralyn PilarKaramalegos, Alexander *   for evaluation of iron deficiency anemia.   Reviewed patient's recent labs  10/11/2019 labs revealed anemia with hemoglobin of 11.7, MCV 79.8.Marland Kitchen.   Reviewed patient's previous labs ordered by primary care physician's office, anemia is chronic onset , duration is since April 2020. No aggravating or improving factors.  Associated signs and symptoms: Patient reports fatigue.  Denies SOB with exertion.  Denies weight loss, easy bruising, hematochezia, hemoptysis, hematuria. Context:  History of iron deficiency: She took oral iron supplementation from April to September 2020 and stopped after iron panel improves. Rectal bleeding: Denies Menstrual bleeding/ Vaginal bleeding : Denies.  Postmenopausal. Hematemesis or hemoptysis : denies Blood in urine : denies   Last endoscopy: Patient has establish care with gastroenterology and has upcoming EGD/colonoscopy next week. Fatigue: Yes.  SOB: deneis  Patient currently takes ferrous gluconate 324 mg once daily.  She previously was on ferrous sulfate and had severe constipation.  Review of Systems  Constitutional: Positive for fatigue. Negative for appetite change, chills and fever.  HENT:   Negative for hearing loss and voice change.   Eyes: Negative for eye problems.  Respiratory: Negative for chest tightness and cough.   Cardiovascular: Negative for chest pain.  Gastrointestinal: Negative for abdominal distention, abdominal pain and blood in stool.  Endocrine: Negative  for hot flashes.  Genitourinary: Negative for difficulty urinating and frequency.   Musculoskeletal: Negative for arthralgias.  Skin: Negative for itching and rash.  Neurological: Negative for extremity weakness.  Hematological: Negative for adenopathy.  Psychiatric/Behavioral: Negative for confusion.    MEDICAL HISTORY:  Past Medical History:  Diagnosis Date  . Allergy   . Anemia   . Asthma   . Thyroid disease    hypothyroid    SURGICAL HISTORY: Past Surgical History:  Procedure Laterality Date  . BREAST MASS EXCISION Left 1974   benign  . PARATHYROIDECTOMY  1982    SOCIAL HISTORY: Social History   Socioeconomic History  . Marital status: Married    Spouse name: Not on file  . Number of children: Not on file  . Years of education: Not on file  . Highest education level: Not on file  Occupational History  . Not on file  Tobacco Use  . Smoking status: Never Smoker  . Smokeless tobacco: Never Used  Substance and Sexual Activity  . Alcohol use: Never  . Drug use: Never  . Sexual activity: Not on file  Other Topics Concern  . Not on file  Social History Narrative  . Not on file   Social Determinants of Health   Financial Resource Strain:   . Difficulty of Paying Living Expenses: Not on file  Food Insecurity:   . Worried About Programme researcher, broadcasting/film/videounning Out of Food in the Last Year: Not on file  . Ran Out of Food in the Last Year: Not on file  Transportation Needs:   . Lack of Transportation (Medical): Not on file  . Lack of Transportation (Non-Medical): Not on file  Physical Activity:   . Days of Exercise per Week: Not on file  . Minutes of  Exercise per Session: Not on file  Stress:   . Feeling of Stress : Not on file  Social Connections:   . Frequency of Communication with Friends and Family: Not on file  . Frequency of Social Gatherings with Friends and Family: Not on file  . Attends Religious Services: Not on file  . Active Member of Clubs or Organizations: Not on  file  . Attends Banker Meetings: Not on file  . Marital Status: Not on file  Intimate Partner Violence:   . Fear of Current or Ex-Partner: Not on file  . Emotionally Abused: Not on file  . Physically Abused: Not on file  . Sexually Abused: Not on file    FAMILY HISTORY: Family History  Problem Relation Age of Onset  . Cancer Mother        Breast, Paget's removed nipple 23, breast 2001  . Colon polyps Mother   . Paget's disease of bone Mother   . Hypertension Mother   . Hyperlipidemia Mother   . Heart disease Father   . Heart attack Father   . Heart disease Maternal Grandfather   . Stroke Paternal Grandmother   . Colon cancer Neg Hx     ALLERGIES:  is allergic to aspirin; contrast media [iodinated diagnostic agents]; ibuprofen; latex; penicillins; rubbing alcohol [alcohol]; sulfur; and adhesive [tape].  MEDICATIONS:  Current Outpatient Medications  Medication Sig Dispense Refill  . ALBUTEROL IN Inhale into the lungs as needed.    . Cholecalciferol (VITAMIN D3) 125 MCG (5000 UT) CAPS Take 1 capsule (5,000 Units total) by mouth daily. For 8 weeks, then start Vitamin D3 2,000 units daily (OTC) 60 capsule 0  . ferrous gluconate (FERGON) 324 MG tablet Take 1 tablet (324 mg total) by mouth daily with breakfast.  0  . montelukast (SINGULAIR) 10 MG tablet Take 1 tablet (10 mg total) by mouth at bedtime as needed. 90 tablet 3  . SYNTHROID 150 MCG tablet Take 1 tablet (150 mcg total) by mouth daily before breakfast. 90 tablet 0  . EPINEPHrine (EPIPEN IJ) Inject as directed as needed.     No current facility-administered medications for this visit.     PHYSICAL EXAMINATION: ECOG PERFORMANCE STATUS: 1 - Symptomatic but completely ambulatory Vitals:   10/19/19 1119  BP: 137/84  Pulse: 96  Temp: 98 F (36.7 C)   Filed Weights   10/19/19 1119  Weight: 229 lb 12.8 oz (104.2 kg)    Physical Exam Constitutional:      General: She is not in acute distress. HENT:       Head: Normocephalic and atraumatic.  Eyes:     General: No scleral icterus.    Pupils: Pupils are equal, round, and reactive to light.  Cardiovascular:     Rate and Rhythm: Normal rate and regular rhythm.     Heart sounds: Normal heart sounds.  Pulmonary:     Effort: Pulmonary effort is normal. No respiratory distress.     Breath sounds: No wheezing.  Abdominal:     General: Bowel sounds are normal. There is no distension.     Palpations: Abdomen is soft. There is no mass.     Tenderness: There is no abdominal tenderness.  Musculoskeletal:        General: No deformity. Normal range of motion.     Cervical back: Normal range of motion and neck supple.  Skin:    General: Skin is warm and dry.     Findings: No erythema or  rash.  Neurological:     Mental Status: She is alert and oriented to person, place, and time.     Cranial Nerves: No cranial nerve deficit.     Coordination: Coordination normal.  Psychiatric:        Behavior: Behavior normal.        Thought Content: Thought content normal.       CMP Latest Ref Rng & Units 02/23/2019  Glucose 65 - 99 mg/dL 111(H)  BUN 7 - 25 mg/dL 16  Creatinine 0.50 - 0.99 mg/dL 0.81  Sodium 135 - 146 mmol/L 141  Potassium 3.5 - 5.3 mmol/L 5.0  Chloride 98 - 110 mmol/L 105  CO2 20 - 32 mmol/L 29  Calcium 8.6 - 10.4 mg/dL 9.3  Total Protein 6.1 - 8.1 g/dL 7.1  Total Bilirubin 0.2 - 1.2 mg/dL 0.5  Alkaline Phos 25 - 125 -  AST 10 - 35 U/L 13  ALT 6 - 29 U/L 9   CBC Latest Ref Rng & Units 10/11/2019  WBC 3.8 - 10.8 Thousand/uL 4.7  Hemoglobin 11.7 - 15.5 g/dL 11.7  Hematocrit 35.0 - 45.0 % 36.7  Platelets 140 - 400 Thousand/uL 322     LABORATORY DATA:  I have reviewed the data as listed Lab Results  Component Value Date   WBC 4.7 10/11/2019   HGB 11.7 10/11/2019   HCT 36.7 10/11/2019   MCV 79.8 (L) 10/11/2019   PLT 322 10/11/2019   Recent Labs    02/23/19 0829  NA 141  K 5.0  CL 105  CO2 29  GLUCOSE 111*  BUN  16  CREATININE 0.81  CALCIUM 9.3  GFRNONAA 77  GFRAA 89  PROT 7.1  AST 13  ALT 9  BILITOT 0.5   Iron/TIBC/Ferritin/ %Sat    Component Value Date/Time   IRON 29 (L) 10/11/2019 0940   TIBC 332 10/11/2019 0940   FERRITIN 7 (L) 10/11/2019 0940   IRONPCTSAT 9 (L) 10/11/2019 0940     No results found.    ASSESSMENT & PLAN:  1. Iron deficiency anemia, unspecified iron deficiency anemia type    Labs are reviewed and discussed with patient. Consistent with iron deficiency anemia. Discussed with the patient about options of taking oral iron supplementation and repeat blood work in 4 to 6 weeks.  I also discussed the option of IV iron with Venofer 200mg  weekly x 4 doses. Allergy reactions/infusion reaction including anaphylactic reaction discussed with patient. Other side effects include but not limited to high blood pressure, skin rash, weight gain, leg swelling, etc. Patient voices understanding and she prefers to try oral iron supplementation first.  If she fails oral iron supplementation, then proceed with IV iron treatments. Her current hemoglobin is 11.7, very mild anemic.  I recommend patient to continue ferrous gluconate 324 mg daily with vitamin C 500 mg daily added to help with absorption. Repeat blood work in 6 weeks.  Encourage patient to follow-up with gastroenterology and have GI work-up done..  Orders Placed This Encounter  Procedures  . CBC with Differential    Standing Status:   Future    Standing Expiration Date:   10/18/2020  . Ferritin    Standing Status:   Future    Standing Expiration Date:   10/18/2020  . Iron and TIBC    Standing Status:   Future    Standing Expiration Date:   10/18/2020    All questions were answered. The patient knows to call the clinic with any problems  questions or concerns.  Cc Karamalegos, Lyn Hollingshead *  Return of visit: 6 weeks Thank you for this kind referral and the opportunity to participate in the care of this patient. A copy of  today's note is routed to referring provider  Total face to face encounter time for this patient visit was . >50% of the time was  spent in counseling and coordination of care.    Rickard Patience, MD, PhD Hematology Oncology [facility] Pager- [phone] 10/19/2019

## 2019-10-25 ENCOUNTER — Other Ambulatory Visit: Payer: Self-pay

## 2019-10-25 ENCOUNTER — Other Ambulatory Visit
Admission: RE | Admit: 2019-10-25 | Discharge: 2019-10-25 | Disposition: A | Discharge status: Home / Self Care | Payer: Medicare Other | Source: Ambulatory Visit | Attending: Gastroenterology | Admitting: Gastroenterology

## 2019-10-25 DIAGNOSIS — Z20828 Contact with and (suspected) exposure to other viral communicable diseases: Secondary | ICD-10-CM | POA: Insufficient documentation

## 2019-10-25 DIAGNOSIS — Z01812 Encounter for preprocedural laboratory examination: Secondary | ICD-10-CM | POA: Diagnosis present

## 2019-10-26 LAB — SARS CORONAVIRUS 2 (TAT 6-24 HRS): SARS Coronavirus 2: NEGATIVE

## 2019-10-27 ENCOUNTER — Encounter: Payer: Self-pay | Admitting: Gastroenterology

## 2019-10-28 ENCOUNTER — Ambulatory Visit
Admission: RE | Admit: 2019-10-28 | Discharge: 2019-10-28 | Disposition: A | Discharge status: Home / Self Care | Payer: Medicare Other | Attending: Gastroenterology | Admitting: Gastroenterology

## 2019-10-28 ENCOUNTER — Encounter: Payer: Self-pay | Admitting: Gastroenterology

## 2019-10-28 ENCOUNTER — Other Ambulatory Visit: Payer: Self-pay

## 2019-10-28 ENCOUNTER — Ambulatory Visit: Payer: Medicare Other | Admitting: Anesthesiology

## 2019-10-28 ENCOUNTER — Encounter
Admission: RE | Disposition: A | Discharge status: Home / Self Care | Payer: Self-pay | Source: Home / Self Care | Attending: Gastroenterology

## 2019-10-28 DIAGNOSIS — D509 Iron deficiency anemia, unspecified: Secondary | ICD-10-CM | POA: Insufficient documentation

## 2019-10-28 DIAGNOSIS — Z1211 Encounter for screening for malignant neoplasm of colon: Secondary | ICD-10-CM | POA: Diagnosis present

## 2019-10-28 DIAGNOSIS — J45909 Unspecified asthma, uncomplicated: Secondary | ICD-10-CM | POA: Diagnosis not present

## 2019-10-28 DIAGNOSIS — E890 Postprocedural hypothyroidism: Secondary | ICD-10-CM | POA: Diagnosis not present

## 2019-10-28 DIAGNOSIS — K3189 Other diseases of stomach and duodenum: Secondary | ICD-10-CM | POA: Diagnosis not present

## 2019-10-28 DIAGNOSIS — Z888 Allergy status to other drugs, medicaments and biological substances status: Secondary | ICD-10-CM | POA: Diagnosis not present

## 2019-10-28 DIAGNOSIS — Z91040 Latex allergy status: Secondary | ICD-10-CM | POA: Diagnosis not present

## 2019-10-28 DIAGNOSIS — Z79899 Other long term (current) drug therapy: Secondary | ICD-10-CM | POA: Insufficient documentation

## 2019-10-28 DIAGNOSIS — K2950 Unspecified chronic gastritis without bleeding: Secondary | ICD-10-CM | POA: Insufficient documentation

## 2019-10-28 DIAGNOSIS — Z886 Allergy status to analgesic agent status: Secondary | ICD-10-CM | POA: Diagnosis not present

## 2019-10-28 DIAGNOSIS — Z91041 Radiographic dye allergy status: Secondary | ICD-10-CM | POA: Diagnosis not present

## 2019-10-28 DIAGNOSIS — Z79890 Hormone replacement therapy (postmenopausal): Secondary | ICD-10-CM | POA: Insufficient documentation

## 2019-10-28 DIAGNOSIS — K317 Polyp of stomach and duodenum: Secondary | ICD-10-CM | POA: Diagnosis not present

## 2019-10-28 DIAGNOSIS — Z880 Allergy status to penicillin: Secondary | ICD-10-CM | POA: Diagnosis not present

## 2019-10-28 DIAGNOSIS — Z87892 Personal history of anaphylaxis: Secondary | ICD-10-CM | POA: Insufficient documentation

## 2019-10-28 HISTORY — PX: ESOPHAGOGASTRODUODENOSCOPY (EGD) WITH PROPOFOL: SHX5813

## 2019-10-28 HISTORY — PX: COLONOSCOPY WITH PROPOFOL: SHX5780

## 2019-10-28 SURGERY — COLONOSCOPY WITH PROPOFOL
Anesthesia: General

## 2019-10-28 MED ORDER — SODIUM CHLORIDE 0.9 % IV SOLN: INTRAVENOUS | Status: DC | PRN

## 2019-10-28 MED ORDER — PROPOFOL 500 MG/50ML IV EMUL
INTRAVENOUS | Status: DC | PRN
  Administered 2019-10-28: 175 ug/kg/min via INTRAVENOUS

## 2019-10-28 MED ORDER — PROPOFOL 10 MG/ML IV BOLUS
INTRAVENOUS | Status: DC | PRN
  Administered 2019-10-28: 70 mg via INTRAVENOUS
  Administered 2019-10-28: 30 mg via INTRAVENOUS

## 2019-10-28 NOTE — Transfer of Care (Signed)
Immediate Anesthesia Transfer of Care Note  Patient: Amanda Pierce  Procedure(s) Performed: COLONOSCOPY WITH PROPOFOL (N/A ) ESOPHAGOGASTRODUODENOSCOPY (EGD) WITH PROPOFOL (N/A )  Patient Location: PACU  Anesthesia Type:General  Level of Consciousness: sedated  Airway & Oxygen Therapy: Patient Spontanous Breathing and Patient connected to nasal cannula oxygen  Post-op Assessment: Report given to RN and Post -op Vital signs reviewed and stable  Post vital signs: Reviewed and stable  Last Vitals:  Vitals Value Taken Time  BP 98/63 10/28/19 1114  Temp 36.3 C 10/28/19 1112  Pulse 68 10/28/19 1119  Resp 18 10/28/19 1119  SpO2 99 % 10/28/19 1119  Vitals shown include unvalidated device data.  Last Pain:  Vitals:   10/28/19 1112  TempSrc: Temporal  PainSc: Asleep         Complications: No apparent anesthesia complications

## 2019-10-28 NOTE — Progress Notes (Signed)
While taking patients cuff of her upper arm notices a red mark on her upper  arm where the blood pressure cuff was placed. The  mark was red and 2 in long. She had indention from the cuff in other places around her arm but normal skin at those  sites .

## 2019-10-28 NOTE — Op Note (Addendum)
[facility] Gastroenterology Patient Name: Amanda LanSharon Woodrick Procedure Date: 10/28/2019 10:10 AM MRN: [id number] Account #: [id number] Date of Birth: 1955/12/18 Admit Type: Outpatient Age: 63 Room[facility] ENDO ROOM 2 Gender: Female Note Status: Finalized Procedure:             Upper GI endoscopy Indications:           Iron deficiency anemia Providers:             Evian Salguero B. Maximino Greenlandahiliani MD, MD Medicines:             Monitored Anesthesia Care Complications:         No immediate complications. Procedure:             Pre-Anesthesia Assessment:                        - The risks and benefits of the procedure and the                         sedation options and risks were discussed with the                         patient. All questions were answered and informed                         consent was obtained.                        - Patient identification and proposed procedure were                         verified prior to the procedure.                        - ASA Grade Assessment: II - A patient with mild                         systemic disease.                        After obtaining informed consent, the endoscope was                         passed under direct vision. Throughout the procedure,                         the patient's blood pressure, pulse, and oxygen                         saturations were monitored continuously. The Endoscope                         was introduced through the mouth, and advanced to the                         second part of duodenum. The upper GI endoscopy was                         accomplished with ease. The patient tolerated the  procedure well. 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      The exam of the esophagus was otherwise normal.      Patchy atrophic mucosa was found in the entire examined stomach.       Biopsies were obtained in the gastric body, at the  incisura and in the       gastric antrum with cold forceps for Helicobacter pylori testing.      A single 10 mm pedunculated polyp with no bleeding and no stigmata of       recent bleeding was found in the gastric antrum. The polyp was initially       seen within the pyloric channel, but removed itself and moved to the       antrum without difficulty. (Pt does not have any obstructive symptoms)      The examined duodenum was normal. Biopsies for histology were taken with       a cold forceps for evaluation of celiac disease. Impression:            - Ectopic gastric mucosa in the proximal esophagus.                        - Gastric mucosal atrophy.                        - A single gastric polyp.                        - Pt had complained of abdominal pain in her clinic                         visits and has Iron deficiency anemia. The gastric                         polyp is likely the cause of both. No obstructive                         symptoms present.                        - Normal examined duodenum. Biopsied.                        - Biopsies were obtained in the gastric body, at the                         incisura and in the gastric antrum. Recommendation:        - Refer to Dr. Meridee Score for gastric polyp removal                        - Await pathology results.                        - Continue present medications.                        - Return to my office as previously scheduled.                        - Return  to primary care physician as previously                         scheduled.                        - The findings and recommendations were discussed with                         the patient.                        - The findings and recommendations were discussed with                         the patient's family. Procedure Code(s):     --- Professional ---                        [phone], Esophagogastroduodenoscopy, flexible,                         transoral; with  biopsy, single or multiple Diagnosis Code(s):     --- Professional ---                        Q40.2, Other specified congenital malformations of                         stomach                        K31.89, Other diseases of stomach and duodenum                        K31.7, Polyp of stomach and duodenum                        D50.9, Iron deficiency anemia, unspecified CPT copyright 2019 American Medical Association. All rights reserved. The codes documented in this report are preliminary and upon coder review may  be revised to meet current compliance requirements.  Vonda Antigua, MD Margretta Sidle B. Bonna Gains MD, MD 10/28/2019 10:48:35 AM This report has been signed electronically. Number of Addenda: 0 Note Initiated On: 10/28/2019 10:10 AM Estimated Blood Loss:  Estimated blood loss: none.      [facility]

## 2019-10-28 NOTE — Anesthesia Procedure Notes (Signed)
Date/Time: 10/28/2019 10:38 AM Performed by: Nelda Marseille, CRNA Pre-anesthesia Checklist: Patient identified, Emergency Drugs available, Suction available, Patient being monitored and Timeout performed Oxygen Delivery Method: Nasal cannula

## 2019-10-28 NOTE — Anesthesia Postprocedure Evaluation (Signed)
Anesthesia Post Note  Patient: [name]  Procedure(s) Performed: COLONOSCOPY WITH PROPOFOL (N/A ) ESOPHAGOGASTRODUODENOSCOPY (EGD) WITH PROPOFOL (N/A )  Patient location during evaluation: Endoscopy Anesthesia Type: General Level of consciousness: awake and alert Pain management: pain level controlled Vital Signs Assessment: post-procedure vital signs reviewed and stable Respiratory status: spontaneous breathing, nonlabored ventilation, respiratory function stable and patient connected to nasal cannula oxygen Cardiovascular status: blood pressure returned to baseline and stable Postop Assessment: no apparent nausea or vomiting Anesthetic complications: no     Last Vitals:  Vitals:   10/28/19 1127 10/28/19 1132  BP:  126/79  Pulse: 67 71  Resp: 15 18  Temp:    SpO2: 100% 100%    Last Pain:  Vitals:   10/28/19 1132  TempSrc:   PainSc: 0-No pain                 Darshan Solanki S

## 2019-10-28 NOTE — Anesthesia Post-op Follow-up Note (Signed)
Anesthesia QCDR form completed.        

## 2019-10-28 NOTE — Anesthesia Preprocedure Evaluation (Signed)
Anesthesia Evaluation  Patient identified by MRN, date of birth, ID band Patient awake    Reviewed: Allergy & Precautions, NPO status , Patient's Chart, lab work & pertinent test results, reviewed documented beta blocker date and time   Airway Mallampati: III  TM Distance: >3 FB     Dental  (+) Chipped   Pulmonary asthma ,           Cardiovascular      Neuro/Psych    GI/Hepatic hiatal hernia, GERD  ,  Endo/Other  Hypothyroidism   Renal/GU      Musculoskeletal   Abdominal   Peds  Hematology  (+) anemia ,   Anesthesia Other Findings   Reproductive/Obstetrics                             Anesthesia Physical Anesthesia Plan  ASA: III  Anesthesia Plan: General   Post-op Pain Management:    Induction: Intravenous  PONV Risk Score and Plan:   Airway Management Planned:   Additional Equipment:   Intra-op Plan:   Post-operative Plan:   Informed Consent: I have reviewed the patients History and Physical, chart, labs and discussed the procedure including the risks, benefits and alternatives for the proposed anesthesia with the patient or authorized representative who has indicated his/her understanding and acceptance.       Plan Discussed with: CRNA  Anesthesia Plan Comments:         Anesthesia Quick Evaluation

## 2019-10-28 NOTE — H&P (Signed)
Melodie Bouillon, MD [address], [place], [place], [address] [address], [address], [place], [place], [address] Phone: [phone]  Fax: [phone]  Primary Care Physician:  Smitty Cords, DO   Pre-Procedure History & Physical: HPI:  Amanda Pierce is a 63 y.o. female is here for a colonoscopy and EGD.   Past Medical History:  Diagnosis Date  . Allergy   . Anemia   . Asthma   . Thyroid disease    hypothyroid    Past Surgical History:  Procedure Laterality Date  . BREAST MASS EXCISION Left 1974   benign  . PARATHYROIDECTOMY  1982    Prior to Admission medications   Medication Sig Start Date End Date Taking? Authorizing Provider  Cholecalciferol (VITAMIN D3) 125 MCG (5000 UT) CAPS Take 1 capsule (5,000 Units total) by mouth daily. For 8 weeks, then start Vitamin D3 2,000 units daily (OTC) 02/26/19  Yes Karamalegos, Netta Neat, DO  montelukast (SINGULAIR) 10 MG tablet Take 1 tablet (10 mg total) by mouth at bedtime as needed. 05/29/18  Yes Karamalegos, Netta Neat, DO  SYNTHROID 150 MCG tablet Take 1 tablet (150 mcg total) by mouth daily before breakfast. 10/13/19  Yes Karamalegos, Netta Neat, DO  ALBUTEROL IN Inhale into the lungs as needed.    [provider]  EPINEPHrine (EPIPEN IJ) Inject as directed as needed.    [provider]  ferrous gluconate (FERGON) 324 MG tablet Take 1 tablet (324 mg total) by mouth daily with breakfast. 10/19/19   Rickard Patience, MD  vitamin C (ASCORBIC ACID) 250 MG tablet Take 2 tablets (500 mg total) by mouth daily. 10/19/19   Rickard Patience, MD    Allergies as of 10/20/2019 - Review Complete 10/19/2019  Allergen Reaction Noted  . Aspirin Anaphylaxis 09/01/2018  . Contrast media [iodinated diagnostic agents] Nausea And Vomiting and Palpitations 09/01/2018  . Ibuprofen Anaphylaxis 09/01/2018  . Latex Rash 09/01/2018  . Penicillins Anaphylaxis 09/01/2018  . Rubbing alcohol [alcohol] Other (See Comments)  09/01/2018  . Sulfur Hives and Rash 09/01/2018  . Adhesive [tape]  10/19/2019    Family History  Problem Relation Age of Onset  . Cancer Mother        Breast, Paget's removed nipple 25, breast 2001  . Colon polyps Mother   . Paget's disease of bone Mother   . Hypertension Mother   . Hyperlipidemia Mother   . Heart disease Father   . Heart attack Father   . Heart disease Maternal Grandfather   . Stroke Paternal Grandmother   . Colon cancer Neg Hx     Social History   Socioeconomic History  . Marital status: Married    Spouse name: Not on file  . Number of children: Not on file  . Years of education: Not on file  . Highest education level: Not on file  Occupational History  . Not on file  Tobacco Use  . Smoking status: Never Smoker  . Smokeless tobacco: Never Used  Substance and Sexual Activity  . Alcohol use: Never  . Drug use: Never  . Sexual activity: Not on file  Other Topics Concern  . Not on file  Social History Narrative  . Not on file   Social Determinants of Health   Financial Resource Strain:   . Difficulty of Paying Living Expenses: Not on file  Food Insecurity:   . Worried About Programme researcher, broadcasting/film/video in the Last Year: Not on file  . Ran Out of Food  in the Last Year: Not on file  Transportation Needs:   . Lack of Transportation (Medical): Not on file  . Lack of Transportation (Non-Medical): Not on file  Physical Activity:   . Days of Exercise per Week: Not on file  . Minutes of Exercise per Session: Not on file  Stress:   . Feeling of Stress : Not on file  Social Connections:   . Frequency of Communication with Friends and Family: Not on file  . Frequency of Social Gatherings with Friends and Family: Not on file  . Attends Religious Services: Not on file  . Active Member of Clubs or Organizations: Not on file  . Attends Archivist Meetings: Not on file  . Marital Status: Not on file  Intimate Partner Violence:   . Fear of Current or  Ex-Partner: Not on file  . Emotionally Abused: Not on file  . Physically Abused: Not on file  . Sexually Abused: Not on file    Review of Systems: See HPI, otherwise negative ROS  Physical Exam: BP 138/84   Pulse 69   Temp (!) 97.3 F (36.3 C) (Temporal)   Resp 16   Ht 5\' 8"  (1.727 m)   Wt 103.4 kg   SpO2 100%   BMI 34.67 kg/m  General:   Alert,  pleasant and cooperative in NAD Head:  Normocephalic and atraumatic. Neck:  Supple; no masses or thyromegaly. Lungs:  Clear throughout to auscultation, normal respiratory effort.    Heart:  +S1, +S2, Regular rate and rhythm, No edema. Abdomen:  Soft, nontender and nondistended. Normal bowel sounds, without guarding, and without rebound.   Neurologic:  Alert and  oriented x4;  grossly normal neurologically.  Impression/Plan: Amanda Pierce is here for a colonoscopy to be performed for average risk screening and EGD for IDA.  Risks, benefits, limitations, and alternatives regarding the procedures have been reviewed with the patient.  Questions have been answered.  All parties agreeable.   Virgel Manifold, MD  10/28/2019, 10:20 AM

## 2019-10-28 NOTE — Op Note (Signed)
[facility] Gastroenterology Patient Name: Amanda Pierce Procedure Date: 10/28/2019 10:09 AM MRN: [id number] Account #: [id number] Date of Birth: [birth year] Admit Type: Outpatient Age: 63 Room: [facility] ENDO ROOM 2 Gender: Female Note Status: Finalized Procedure:             Colonoscopy Indications:           Screening for colorectal malignant neoplasm Providers:             Zayed Griffie B. Maximino Greenland MD, MD Medicines:             Monitored Anesthesia Care Complications:         No immediate complications. Procedure:             Pre-Anesthesia Assessment:                        - Prior to the procedure, a History and Physical was                         performed, and patient medications, allergies and                         sensitivities were reviewed. The patient's tolerance                         of previous anesthesia was reviewed.                        - The risks and benefits of the procedure and the                         sedation options and risks were discussed with the                         patient. All questions were answered and informed                         consent was obtained.                        - Patient identification and proposed procedure were                         verified prior to the procedure by the physician, the                         nurse, the anesthetist and the technician. The                         procedure was verified in the pre-procedure area in                         the procedure room in the endoscopy suite.                        - ASA Grade Assessment: II - A patient with mild                         systemic disease.                        -  After reviewing the risks and benefits, the patient                         was deemed in satisfactory condition to undergo the                         procedure.                        After obtaining informed consent, the colonoscope was                         passed under  direct vision. Throughout the procedure,                         the patient's blood pressure, pulse, and oxygen                         saturations were monitored continuously. The                         Colonoscope was introduced through the anus and                         advanced to the the cecum, identified by appendiceal                         orifice and ileocecal valve. The colonoscopy was                         performed with ease. The patient tolerated the                         procedure well. The quality of the bowel preparation                         was good. Findings:      The perianal and digital rectal examinations were normal.      The rectum, sigmoid colon, descending colon, transverse colon, ascending       colon and cecum appeared normal.      The retroflexed view of the distal rectum and anal verge was normal and       showed no anal or rectal abnormalities. Impression:            - The rectum, sigmoid colon, descending colon,                         transverse colon, ascending colon and cecum are normal.                        - The distal rectum and anal verge are normal on                         retroflexion view.                        - No specimens collected. Recommendation:        - Discharge patient to home.                        -  Resume previous diet.                        - Continue present medications.                        - Repeat colonoscopy in 10 years for screening                         purposes.                        - Return to primary care physician as previously                         scheduled.                        - The findings and recommendations were discussed with                         the patient.                        - The findings and recommendations were discussed with                         the patient's family. Procedure Code(s):     --- Professional ---                        R4270, Colorectal cancer  screening; colonoscopy on                         individual not meeting criteria for high risk Diagnosis Code(s):     --- Professional ---                        Z12.11, Encounter for screening for malignant neoplasm                         of colon CPT copyright 2019 American Medical Association. All rights reserved. The codes documented in this report are preliminary and upon coder review may  be revised to meet current compliance requirements.  Vonda Antigua, MD Margretta Sidle B. Bonna Gains MD, MD 10/28/2019 11:11:35 AM This report has been signed electronically. Number of Addenda: 0 Note Initiated On: 10/28/2019 10:09 AM Scope Withdrawal Time: 0 hours 13 minutes 30 seconds  Total Procedure Duration: 0 hours 18 minutes 21 seconds       Detar North

## 2019-10-29 ENCOUNTER — Encounter: Payer: Self-pay | Admitting: *Deleted

## 2019-11-01 ENCOUNTER — Telehealth: Payer: Self-pay

## 2019-11-01 DIAGNOSIS — K317 Polyp of stomach and duodenum: Secondary | ICD-10-CM

## 2019-11-01 NOTE — Telephone Encounter (Signed)
Did referral  

## 2019-11-01 NOTE — Telephone Encounter (Signed)
-----   Message from Pasty Spillers, MD sent at 10/28/2019 11:20 AM EST ----- Refer to Dr. Meridee Score for large gastric polyp please.

## 2019-11-02 ENCOUNTER — Telehealth: Payer: Self-pay

## 2019-11-02 ENCOUNTER — Encounter: Payer: Self-pay | Admitting: Gastroenterology

## 2019-11-02 LAB — SURGICAL PATHOLOGY

## 2019-11-02 NOTE — Telephone Encounter (Signed)
Mansouraty, Netty Starring., MD  Loretha Stapler, RN; Denman George, CMA  Media Pizzini,  Please set up for a clinic visit to discuss EMR attempt. OK to double book on a day if necessary to get in sooner.  Please schedule for EGD with EMR (OK to schedule if patient is OK and have clinic visit a few days before if she desires.  Thank you.  GM

## 2019-11-02 NOTE — Telephone Encounter (Signed)
The pt wants to come in and speak with Dr Meridee Score prior to scheduling any procedure.  Appt made for 1/13. Pt provided address and phone number.

## 2019-11-04 ENCOUNTER — Ambulatory Visit: Payer: Medicare Other

## 2019-11-10 ENCOUNTER — Encounter: Payer: Self-pay | Admitting: Gastroenterology

## 2019-11-10 ENCOUNTER — Other Ambulatory Visit (INDEPENDENT_AMBULATORY_CARE_PROVIDER_SITE_OTHER): Payer: Medicare Other

## 2019-11-10 ENCOUNTER — Ambulatory Visit: Payer: Medicare Other | Admitting: Gastroenterology

## 2019-11-10 VITALS — BP 124/80 | HR 69 | Temp 98.7°F | Ht 68.0 in | Wt 228.0 lb

## 2019-11-10 DIAGNOSIS — D509 Iron deficiency anemia, unspecified: Secondary | ICD-10-CM | POA: Diagnosis not present

## 2019-11-10 DIAGNOSIS — Q858 Other phakomatoses, not elsewhere classified: Secondary | ICD-10-CM

## 2019-11-10 DIAGNOSIS — K317 Polyp of stomach and duodenum: Secondary | ICD-10-CM

## 2019-11-10 DIAGNOSIS — D500 Iron deficiency anemia secondary to blood loss (chronic): Secondary | ICD-10-CM | POA: Diagnosis not present

## 2019-11-10 DIAGNOSIS — I781 Nevus, non-neoplastic: Secondary | ICD-10-CM

## 2019-11-10 DIAGNOSIS — Q8589 Other phakomatoses, not elsewhere classified: Secondary | ICD-10-CM

## 2019-11-10 LAB — CBC
HCT: 37.7 % (ref 36.0–46.0)
Hemoglobin: 12.2 g/dL (ref 12.0–15.0)
MCHC: 32.3 g/dL (ref 30.0–36.0)
MCV: 77.7 fl — ABNORMAL LOW (ref 78.0–100.0)
Platelets: 320 10*3/uL (ref 150.0–400.0)
RBC: 4.85 Mil/uL (ref 3.87–5.11)
RDW: 17.1 % — ABNORMAL HIGH (ref 11.5–15.5)
WBC: 5.2 10*3/uL (ref 4.0–10.5)

## 2019-11-10 LAB — BASIC METABOLIC PANEL
BUN: 12 mg/dL (ref 6–23)
CO2: 26 mEq/L (ref 19–32)
Calcium: 8.9 mg/dL (ref 8.4–10.5)
Chloride: 106 mEq/L (ref 96–112)
Creatinine, Ser: 0.82 mg/dL (ref 0.40–1.20)
GFR: 84.49 mL/min (ref >60.00)
Glucose, Bld: 108 mg/dL — ABNORMAL HIGH (ref 70–99)
Potassium: 4.1 mEq/L (ref 3.5–5.1)
Sodium: 138 mEq/L (ref 135–145)

## 2019-11-10 LAB — PROTIME-INR
INR: 1.2 ratio — ABNORMAL HIGH (ref 0.8–1.0)
Prothrombin Time: 13.9 s — ABNORMAL HIGH (ref 9.6–13.1)

## 2019-11-10 NOTE — Patient Instructions (Signed)
If you are age 66 or older, your body mass index should be between 23-30. Your Body mass index is 34.67 kg/m. If this is out of the aforementioned range listed, please consider follow up with your Primary Care Provider.  Your provider has requested that you go to the basement level for lab work before leaving today. Press "B" on the elevator. The lab is located at the first door on the left as you exit the elevator.  You have been scheduled for an endoscopy. Please follow written instructions given to you at your visit today. If you use inhalers (even only as needed), please bring them with you on the day of your procedure.   Due to recent changes in healthcare laws, you may see the results of your imaging and laboratory studies on MyChart before your provider has had a chance to review them.  We understand that in some cases there may be results that are confusing or concerning to you. Not all laboratory results come back in the same time frame and the provider may be waiting for multiple results in order to interpret others.  Please give [place] 48 hours in order for your provider to thoroughly review all the results before contacting the office for clarification of your results.   Thank you for choosing me and [facility].  Dr. Meridee Score

## 2019-11-10 NOTE — H&P (View-Only) (Signed)
GASTROENTEROLOGY OUTPATIENT CLINIC VISIT   Primary Care Provider Smitty Cords, DO [address] [place] [address] [phone]  Referring Provider Dr. Maximino Greenland  Patient Profile: Amanda Pierce is a 64 y.o. female  with a pmh significant for asthma, allergies, hypothyroidism, chronic anemia (recurrent iron deficiency anemia for years per patient), obesity, osteopenia, GERD, hyperlipidemia, Mucocutaneous Telangiectasias.  The patient presents to the [facility] for an evaluation and management of problem(s) noted below:  Problem List 1. Gastric polyp   2. 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 Cutaneous telangiectasia     History of Present Illness This is a patient who is followed by Dr. Maximino Greenland of [facility].  She is referred for consideration of advanced gastric polyp resection.  The patient has had longstanding issues of anemia.  She has iron deficiency.  She has received IV iron infusions in the past but last was in the 1990s.  Etiology of her iron deficiency has never been thoroughly understood.  She has undergone EGDs and colonoscopies in [place] and more recently with Dr. Maximino Greenland in 2020.  She was found to have a gastric polyp in the antrum.  It was felt that this could be a source for potential bleeding or chronic anemia and is for this reason the patient is referred.  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  She has never had polyps found on any of her endoscopic evaluation.  Patient also has multiple GI symptoms including abdominal pain in the midepigastrium as well as generalized abdominal pain.  She has acid reflux/pyrosis.  She has had belching and bloating.  She occasionally has nausea.  She has constipation longstanding.  No significant family history of GI malignancies.  Patient has not noted any dark stool.  She has not  tolerated oral iron intake as a result of significant constipation symptoms.  She does not take significant nonsteroidals or BC/Goody powders on a regular basis.  GI Review of Systems Positive as above Negative for dysphagia, odynophagia, melena, hematochezia  Review of Systems General: Denies fevers/chills HEENT: Denies oral lesions Cardiovascular: Denies chest pain Pulmonary: Denies shortness of breath Gastroenterological: See HPI Genitourinary: Denies darkened urine or hematuria Hematological: Denies easy bruising/bleeding Endocrine: Positive for history of temperature intolerance Dermatological: Denies jaundice Psychological: Mood is anxious to have this polyp removed and hopeful to feel better and do better (she needs to because her husband is also dealing with many medical issues per her report)   Medications Current Outpatient Medications  Medication Sig Dispense Refill  . ALBUTEROL IN Inhale into the lungs as needed.    . Cholecalciferol (VITAMIN D3) 125 MCG (5000 UT) CAPS Take 1 capsule (5,000 Units total) by mouth daily. For 8 weeks, then start Vitamin D3 2,000 units daily (OTC) 60 capsule 0  . EPINEPHrine (EPIPEN IJ) Inject as directed as needed.    . ferrous gluconate (FERGON) 324 MG tablet Take 1 tablet (324 mg total) by mouth daily with breakfast. 60 tablet 1  . montelukast (SINGULAIR) 10 MG tablet Take 1 tablet (10 mg total) by mouth at bedtime as needed. 90 tablet 3  . SYNTHROID 150 MCG tablet Take 1 tablet (150 mcg total) by mouth daily before breakfast. 90 tablet 0  . vitamin C (ASCORBIC ACID) 250 MG tablet Take 2 tablets (500 mg total) by mouth daily. 60 tablet 1   No current facility-administered medications for this visit.  Allergies Allergies  Allergen Reactions  . Aspirin Anaphylaxis  . Contrast Media [Iodinated Diagnostic Agents] Nausea And Vomiting and Palpitations  . Ibuprofen Anaphylaxis  . Latex Rash    Any latex adhesive, rubber compounds:   examples: ekg leads,etc.  . Penicillins Anaphylaxis  . Rubbing Alcohol [Alcohol] Other (See Comments)    Severe skin warmth  . Sulfur Hives and Rash  . Adhesive [Tape]     Histories Past Medical History:  Diagnosis Date  . Allergy   . Anemia   . Asthma   . Thyroid disease    hypothyroid   Past Surgical History:  Procedure Laterality Date  . BREAST MASS EXCISION Left 1974   benign  . COLONOSCOPY WITH PROPOFOL N/A 10/28/2019   Procedure: COLONOSCOPY WITH PROPOFOL;  Surgeon: Virgel Manifold, MD;  Location: [facility] ENDOSCOPY;  Service: Endoscopy;  Laterality: N/A;  . ESOPHAGOGASTRODUODENOSCOPY (EGD) WITH PROPOFOL N/A 10/28/2019   Procedure: ESOPHAGOGASTRODUODENOSCOPY (EGD) WITH PROPOFOL;  Surgeon: Virgel Manifold, MD;  Location: [facility] ENDOSCOPY;  Service: Endoscopy;  Laterality: N/A;  . PARATHYROIDECTOMY  1982   Social History   Socioeconomic History  . Marital status: Married    Spouse name: Not on file  . Number of children: Not on file  . Years of education: Not on file  . Highest education level: Not on file  Occupational History  . Occupation: retired  Tobacco Use  . Smoking status: Never Smoker  . Smokeless tobacco: Never Used  Substance and Sexual Activity  . Alcohol use: Never  . Drug use: Never  . Sexual activity: Not on file  Other Topics Concern  . Not on file  Social History Narrative  . Not on file   Social Determinants of Health   Financial Resource Strain:   . Difficulty of Paying Living Expenses: Not on file  Food Insecurity:   . Worried About Charity fundraiser in the Last Year: Not on file  . Ran Out of Food in the Last Year: Not on file  Transportation Needs:   . Lack of Transportation (Medical): Not on file  . Lack of Transportation (Non-Medical): Not on file  Physical Activity:   . Days of Exercise per Week: Not on file  . Minutes of Exercise per Session: Not on file  Stress:   . Feeling of Stress : Not on file  Social  Connections:   . Frequency of Communication with Friends and Family: Not on file  . Frequency of Social Gatherings with Friends and Family: Not on file  . Attends Religious Services: Not on file  . Active Member of Clubs or Organizations: Not on file  . Attends Archivist Meetings: Not on file  . Marital Status: Not on file  Intimate Partner Violence:   . Fear of Current or Ex-Partner: Not on file  . Emotionally Abused: Not on file  . Physically Abused: Not on file  . Sexually Abused: Not on file   Family History  Problem Relation Age of Onset  . Cancer Mother        Breast, Paget's removed nipple 58, breast 2001  . Colon polyps Mother   . Paget's disease of bone Mother   . Hypertension Mother   . Hyperlipidemia Mother   . Heart disease Father   . Heart attack Father   . Heart disease Maternal Grandfather   . Stroke Paternal Grandmother   . Colon cancer Neg Hx   . Esophageal cancer Neg Hx   . Inflammatory  bowel disease Neg Hx   . Liver disease Neg Hx   . Pancreatic cancer Neg Hx   . Rectal cancer Neg Hx   . Stomach cancer Neg Hx    I have reviewed her medical, social, and family history in detail and updated the electronic medical record as necessary.    PHYSICAL EXAMINATION  BP 124/80   Pulse 69   Temp 98.7 F (37.1 C)   Ht 5' 8" (1.727 m)   Wt 228 lb (103.4 kg)   SpO2 100%   BMI 34.67 kg/m  Wt Readings from Last 3 Encounters:  11/10/19 228 lb (103.4 kg)  10/28/19 228 lb (103.4 kg)  10/19/19 229 lb 12.8 oz (104.2 kg)  GEN: NAD, appears stated age, doesn't appear chronically ill PSYCH: Cooperative, without pressured speech EYE: Conjunctivae pink, sclerae anicteric ENT: MMM, without oral ulcers, no erythema or exudates noted CV: RR without R/Gs  RESP: No wheezing appreciated GI: NABS, soft, minimal tenderness to palpation in midepigastrium, nondistended, without rebound or guarding MSK/EXT: Evidence of what looked to be mucocutaneous telangiectasias  on the patient's lips but not on patient's time, no evidence of other telangiectasias on her skin although she is black in complexion SKIN: No jaundice NEURO:  Alert & Oriented x 3, no focal deficits   REVIEW OF DATA  I reviewed the following data at the time of this encounter:  GI Procedures and Studies  December 2020 EGD - Ectopic gastric mucosa in the proximal esophagus. - Gastric mucosal atrophy. - A single gastric polyp. - Pt had complained of abdominal pain in her clinic visits and has Iron deficiency anemia. The gastric polyp is likely the cause of both. No obstructive symptoms present. - Normal examined duodenum. Biopsied. - Biopsies were obtained in the gastric body, at the incisura and in the gastric antrum.  December 2020 colonoscopy the - The rectum, sigmoid colon, descending colon, transverse colon, ascending colon and cecum are normal. - The distal rectum and anal verge are normal on retroflexion view. - No specimens collected.  Laboratory Studies  Reviewed those in epic  Imaging Studies  No relevant imaging studies to review   ASSESSMENT  Amanda Pierce is a 63 y.o. female with a pmh significant for asthma, allergies, hypothyroidism, chronic anemia (recurrent iron deficiency anemia for years per patient), obesity, osteopenia, GERD, hyperlipidemia, Mucocutaneous Telangiectasias.  The patient is seen today for evaluation and management of:  1. Gastric polyp   2. 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 Cutaneous telangiectasia    Patient is hemodynamically stable.  Clinical perspective she continues to have multiple GI symptoms but will be following up with Dr. Tahiliani for further work-up and management of this.  With that being said, this gastric polyp does look to have some erosive this to them.  Based upon the description and endoscopic pictures I do feel that it is reasonable to pursue an Advanced Polypectomy attempt of the  polyp/lesion.  We discussed some of the techniques of advanced polypectomy which include Endoscopic Mucosal Resection, OVESCO Full-Thickness Resection, and Tissue Ablation via Fulguration.  The risks and benefits of endoscopic evaluation were discussed with the patient; these include but are not limited to the risk of perforation, infection, bleeding, missed lesions, lack of diagnosis, severe illness requiring hospitalization, as well as anesthesia and sedation related illnesses.  During attempts at advanced resection, the risks of bleeding and perforation/leak are increased as opposed to diagnostic and screening   procedures, and that was discussed with the patient as well.   In addition, I explained that with the possible need for piecemeal resection, subsequent short-interval endoscopic evaluation for follow up and potential retreatment of the lesion/area may be necessary.  I did offer, a referral to surgery in order for patient to have opportunity to discuss surgical management/intervention prior to finalizing decision for attempt at endoscopic removal, however, the patient deferred on this.  If, after attempt at removal of the polyp/lesion, it is found that the patient has a complication or that an invasive lesion or malignant lesion is found, or that the polyp/lesion continues to recur, the patient is aware and understands that surgery may still be indicated/required.  Interesting, that the patient has these mucocutaneous telangiectasias.  The possibility of underlying Peutz Jaeger syndrome must be considered although she has had multiple endoscopies and colonoscopies over the course of her last 15 to 20 years that no polyps have been found until this.  This does not have the appearance of a typical posterior.  I agree with Dr.  Maximino Greenland if this does not have a significant benefit in regards to her iron deficiency, that she will benefit from a video capsule endoscopy.  If the polyp is removed and there is  evidence of this being hamartomatous, then a video capsule endoscopy would definitely be helpful to understand whether she can have further polyps further in the small bowel.  She is not tolerating the oral iron supplementation.  I think she will need IV iron infusions.  She is worried about the possibility of an anaphylactic reaction.  We discussed the risk of that  but she wants to wait on any IV iron infusions.  She will talk with Dr. Cathie Hoops after we complete our endoscopic evaluation and potential treatment.  All patient questions were answered, to the best of my ability, and the patient agrees to the aforementioned plan of action with follow-up as indicated.   PLAN  Laboratories as outlined below Proceed with scheduling EGD with EMR attempt 90 minutes Follow-up to be dictated thereafter Oral iron if she can tolerate otherwise I think she would benefit from IV iron May end up needing a video capsule endoscopy All other GI symptoms to be managed by primary gastroenterologist Dr. Maximino Greenland   Orders Placed This Encounter  Procedures  . Procedural/ Surgical Case Request: ESOPHAGOGASTRODUODENOSCOPY (EGD) WITH PROPOFOL, ENDOSCOPIC MUCOSAL RESECTION  . CBC  . Basic Metabolic Panel (BMET)  . INR/PT  . Ambulatory referral to Gastroenterology    New Prescriptions   No medications on file   Modified Medications   No medications on file    Planned Follow Up No follow-ups on file.   Total Time in Face-to-Face and in Coordination of Care for patient including review/personal interpretation of prior testing, medical history, examination, medication adjustment, documentation with the EHR is 45 minutes.   Corliss Parish, MD [facility] Advanced Endoscopy Office # [phone]

## 2019-11-10 NOTE — Progress Notes (Signed)
GASTROENTEROLOGY OUTPATIENT CLINIC VISIT   Primary Care Provider Smitty Cords, DO [address] [place] [address] [phone]  Referring Provider Dr. Maximino Greenland  Patient Profile: Amanda Pierce is a 64 y.o. female  with a pmh significant for asthma, allergies, hypothyroidism, chronic anemia (recurrent iron deficiency anemia for years per patient), obesity, osteopenia, GERD, hyperlipidemia, Mucocutaneous Telangiectasias.  The patient presents to the [facility] for an evaluation and management of problem(s) noted below:  Problem List 1. Gastric polyp   2. 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 Cutaneous telangiectasia     History of Present Illness This is a patient who is followed by Dr. Maximino Greenland of [facility].  She is referred for consideration of advanced gastric polyp resection.  The patient has had longstanding issues of anemia.  She has iron deficiency.  She has received IV iron infusions in the past but last was in the 1990s.  Etiology of her iron deficiency has never been thoroughly understood.  She has undergone EGDs and colonoscopies in [place] and more recently with Dr. Maximino Greenland in 2020.  She was found to have a gastric polyp in the antrum.  It was felt that this could be a source for potential bleeding or chronic anemia and is for this reason the patient is referred.  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  She has never had polyps found on any of her endoscopic evaluation.  Patient also has multiple GI symptoms including abdominal pain in the midepigastrium as well as generalized abdominal pain.  She has acid reflux/pyrosis.  She has had belching and bloating.  She occasionally has nausea.  She has constipation longstanding.  No significant family history of GI malignancies.  Patient has not noted any dark stool.  She has not  tolerated oral iron intake as a result of significant constipation symptoms.  She does not take significant nonsteroidals or BC/Goody powders on a regular basis.  GI Review of Systems Positive as above Negative for dysphagia, odynophagia, melena, hematochezia  Review of Systems General: Denies fevers/chills HEENT: Denies oral lesions Cardiovascular: Denies chest pain Pulmonary: Denies shortness of breath Gastroenterological: See HPI Genitourinary: Denies darkened urine or hematuria Hematological: Denies easy bruising/bleeding Endocrine: Positive for history of temperature intolerance Dermatological: Denies jaundice Psychological: Mood is anxious to have this polyp removed and hopeful to feel better and do better (she needs to because her husband is also dealing with many medical issues per her report)   Medications Current Outpatient Medications  Medication Sig Dispense Refill  . ALBUTEROL IN Inhale into the lungs as needed.    . Cholecalciferol (VITAMIN D3) 125 MCG (5000 UT) CAPS Take 1 capsule (5,000 Units total) by mouth daily. For 8 weeks, then start Vitamin D3 2,000 units daily (OTC) 60 capsule 0  . EPINEPHrine (EPIPEN IJ) Inject as directed as needed.    . ferrous gluconate (FERGON) 324 MG tablet Take 1 tablet (324 mg total) by mouth daily with breakfast. 60 tablet 1  . montelukast (SINGULAIR) 10 MG tablet Take 1 tablet (10 mg total) by mouth at bedtime as needed. 90 tablet 3  . SYNTHROID 150 MCG tablet Take 1 tablet (150 mcg total) by mouth daily before breakfast. 90 tablet 0  . vitamin C (ASCORBIC ACID) 250 MG tablet Take 2 tablets (500 mg total) by mouth daily. 60 tablet 1   No current facility-administered medications for this visit.  Allergies Allergies  Allergen Reactions  . Aspirin Anaphylaxis  . Contrast Media [Iodinated Diagnostic Agents] Nausea And Vomiting and Palpitations  . Ibuprofen Anaphylaxis  . Latex Rash    Any latex adhesive, rubber compounds:   examples: ekg leads,etc.  . Penicillins Anaphylaxis  . Rubbing Alcohol [Alcohol] Other (See Comments)    Severe skin warmth  . Sulfur Hives and Rash  . Adhesive [Tape]     Histories Past Medical History:  Diagnosis Date  . Allergy   . Anemia   . Asthma   . Thyroid disease    hypothyroid   Past Surgical History:  Procedure Laterality Date  . BREAST MASS EXCISION Left 1974   benign  . COLONOSCOPY WITH PROPOFOL N/A 10/28/2019   Procedure: COLONOSCOPY WITH PROPOFOL;  Surgeon: Virgel Manifold, MD;  Location: [facility] ENDOSCOPY;  Service: Endoscopy;  Laterality: N/A;  . ESOPHAGOGASTRODUODENOSCOPY (EGD) WITH PROPOFOL N/A 10/28/2019   Procedure: ESOPHAGOGASTRODUODENOSCOPY (EGD) WITH PROPOFOL;  Surgeon: Virgel Manifold, MD;  Location: [facility] ENDOSCOPY;  Service: Endoscopy;  Laterality: N/A;  . PARATHYROIDECTOMY  1982   Social History   Socioeconomic History  . Marital status: Married    Spouse name: Not on file  . Number of children: Not on file  . Years of education: Not on file  . Highest education level: Not on file  Occupational History  . Occupation: retired  Tobacco Use  . Smoking status: Never Smoker  . Smokeless tobacco: Never Used  Substance and Sexual Activity  . Alcohol use: Never  . Drug use: Never  . Sexual activity: Not on file  Other Topics Concern  . Not on file  Social History Narrative  . Not on file   Social Determinants of Health   Financial Resource Strain:   . Difficulty of Paying Living Expenses: Not on file  Food Insecurity:   . Worried About Charity fundraiser in the Last Year: Not on file  . Ran Out of Food in the Last Year: Not on file  Transportation Needs:   . Lack of Transportation (Medical): Not on file  . Lack of Transportation (Non-Medical): Not on file  Physical Activity:   . Days of Exercise per Week: Not on file  . Minutes of Exercise per Session: Not on file  Stress:   . Feeling of Stress : Not on file  Social  Connections:   . Frequency of Communication with Friends and Family: Not on file  . Frequency of Social Gatherings with Friends and Family: Not on file  . Attends Religious Services: Not on file  . Active Member of Clubs or Organizations: Not on file  . Attends Archivist Meetings: Not on file  . Marital Status: Not on file  Intimate Partner Violence:   . Fear of Current or Ex-Partner: Not on file  . Emotionally Abused: Not on file  . Physically Abused: Not on file  . Sexually Abused: Not on file   Family History  Problem Relation Age of Onset  . Cancer Mother        Breast, Paget's removed nipple 58, breast 2001  . Colon polyps Mother   . Paget's disease of bone Mother   . Hypertension Mother   . Hyperlipidemia Mother   . Heart disease Father   . Heart attack Father   . Heart disease Maternal Grandfather   . Stroke Paternal Grandmother   . Colon cancer Neg Hx   . Esophageal cancer Neg Hx   . Inflammatory  bowel disease Neg Hx   . Liver disease Neg Hx   . Pancreatic cancer Neg Hx   . Rectal cancer Neg Hx   . Stomach cancer Neg Hx    I have reviewed her medical, social, and family history in detail and updated the electronic medical record as necessary.    PHYSICAL EXAMINATION  BP 124/80   Pulse 69   Temp 98.7 F (37.1 C)   Ht 5\' 8"  (1.727 m)   Wt 228 lb (103.4 kg)   SpO2 100%   BMI 34.67 kg/m  Wt Readings from Last 3 Encounters:  11/10/19 228 lb (103.4 kg)  10/28/19 228 lb (103.4 kg)  10/19/19 229 lb 12.8 oz (104.2 kg)  GEN: NAD, appears stated age, doesn't appear chronically ill PSYCH: Cooperative, without pressured speech EYE: Conjunctivae pink, sclerae anicteric ENT: MMM, without oral ulcers, no erythema or exudates noted CV: RR without R/Gs  RESP: No wheezing appreciated GI: NABS, soft, minimal tenderness to palpation in midepigastrium, nondistended, without rebound or guarding MSK/EXT: Evidence of what looked to be mucocutaneous telangiectasias  on the patient's lips but not on patient's time, no evidence of other telangiectasias on her skin although she is black in complexion SKIN: No jaundice NEURO:  Alert & Oriented x 3, no focal deficits   REVIEW OF DATA  I reviewed the following data at the time of this encounter:  GI Procedures and Studies  December 2020 EGD - Ectopic gastric mucosa in the proximal esophagus. - Gastric mucosal atrophy. - A single gastric polyp. - Pt had complained of abdominal pain in her clinic visits and has Iron deficiency anemia. The gastric polyp is likely the cause of both. No obstructive symptoms present. - Normal examined duodenum. Biopsied. - Biopsies were obtained in the gastric body, at the incisura and in the gastric antrum.  December 2020 colonoscopy the - The rectum, sigmoid colon, descending colon, transverse colon, ascending colon and cecum are normal. - The distal rectum and anal verge are normal on retroflexion view. - No specimens collected.  Laboratory Studies  Reviewed those in epic  Imaging Studies  No relevant imaging studies to review   ASSESSMENT  Ms. Punches is a 64 y.o. female with a pmh significant for asthma, allergies, hypothyroidism, chronic anemia (recurrent iron deficiency anemia for years per patient), obesity, osteopenia, GERD, hyperlipidemia, Mucocutaneous Telangiectasias.  The patient is seen today for evaluation and management of:  1. Gastric polyp   2. 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 Cutaneous telangiectasia    Patient is hemodynamically stable.  Clinical perspective she continues to have multiple GI symptoms but will be following up with Dr. 76 for further work-up and management of this.  With that being said, this gastric polyp does look to have some erosive this to them.  Based upon the description and endoscopic pictures I do feel that it is reasonable to pursue an Advanced Polypectomy attempt of the  polyp/lesion.  We discussed some of the techniques of advanced polypectomy which include Endoscopic Mucosal Resection, OVESCO Full-Thickness Resection, and Tissue Ablation via Fulguration.  The risks and benefits of endoscopic evaluation were discussed with the patient; these include but are not limited to the risk of perforation, infection, bleeding, missed lesions, lack of diagnosis, severe illness requiring hospitalization, as well as anesthesia and sedation related illnesses.  During attempts at advanced resection, the risks of bleeding and perforation/leak are increased as opposed to diagnostic and screening  procedures, and that was discussed with the patient as well.   In addition, I explained that with the possible need for piecemeal resection, subsequent short-interval endoscopic evaluation for follow up and potential retreatment of the lesion/area may be necessary.  I did offer, a referral to surgery in order for patient to have opportunity to discuss surgical management/intervention prior to finalizing decision for attempt at endoscopic removal, however, the patient deferred on this.  If, after attempt at removal of the polyp/lesion, it is found that the patient has a complication or that an invasive lesion or malignant lesion is found, or that the polyp/lesion continues to recur, the patient is aware and understands that surgery may still be indicated/required.  Interesting, that the patient has these mucocutaneous telangiectasias.  The possibility of underlying Peutz Jaeger syndrome must be considered although she has had multiple endoscopies and colonoscopies over the course of her last 15 to 20 years that no polyps have been found until this.  This does not have the appearance of a typical posterior.  I agree with Dr.  Maximino Greenland if this does not have a significant benefit in regards to her iron deficiency, that she will benefit from a video capsule endoscopy.  If the polyp is removed and there is  evidence of this being hamartomatous, then a video capsule endoscopy would definitely be helpful to understand whether she can have further polyps further in the small bowel.  She is not tolerating the oral iron supplementation.  I think she will need IV iron infusions.  She is worried about the possibility of an anaphylactic reaction.  We discussed the risk of that  but she wants to wait on any IV iron infusions.  She will talk with Dr. Cathie Hoops after we complete our endoscopic evaluation and potential treatment.  All patient questions were answered, to the best of my ability, and the patient agrees to the aforementioned plan of action with follow-up as indicated.   PLAN  Laboratories as outlined below Proceed with scheduling EGD with EMR attempt 90 minutes Follow-up to be dictated thereafter Oral iron if she can tolerate otherwise I think she would benefit from IV iron May end up needing a video capsule endoscopy All other GI symptoms to be managed by primary gastroenterologist Dr. Maximino Greenland   Orders Placed This Encounter  Procedures  . Procedural/ Surgical Case Request: ESOPHAGOGASTRODUODENOSCOPY (EGD) WITH PROPOFOL, ENDOSCOPIC MUCOSAL RESECTION  . CBC  . Basic Metabolic Panel (BMET)  . INR/PT  . Ambulatory referral to Gastroenterology    New Prescriptions   No medications on file   Modified Medications   No medications on file    Planned Follow Up No follow-ups on file.   Total Time in Face-to-Face and in Coordination of Care for patient including review/personal interpretation of prior testing, medical history, examination, medication adjustment, documentation with the EHR is 45 minutes.   Corliss Parish, MD [facility] Advanced Endoscopy Office # [phone]

## 2019-11-12 ENCOUNTER — Other Ambulatory Visit: Payer: Self-pay | Admitting: Oncology

## 2019-11-16 ENCOUNTER — Encounter: Payer: Self-pay | Admitting: Family Medicine

## 2019-11-18 ENCOUNTER — Other Ambulatory Visit (HOSPITAL_COMMUNITY): Payer: Medicare Other

## 2019-11-18 ENCOUNTER — Other Ambulatory Visit
Admission: RE | Admit: 2019-11-18 | Discharge: 2019-11-18 | Disposition: A | Discharge status: Home / Self Care | Payer: Medicare Other | Source: Ambulatory Visit | Attending: Gastroenterology | Admitting: Gastroenterology

## 2019-11-18 ENCOUNTER — Other Ambulatory Visit: Payer: Self-pay

## 2019-11-18 ENCOUNTER — Encounter (HOSPITAL_COMMUNITY): Payer: Self-pay | Admitting: Gastroenterology

## 2019-11-18 DIAGNOSIS — Z20822 Contact with and (suspected) exposure to covid-19: Secondary | ICD-10-CM | POA: Insufficient documentation

## 2019-11-18 DIAGNOSIS — Z01812 Encounter for preprocedural laboratory examination: Secondary | ICD-10-CM | POA: Diagnosis present

## 2019-11-18 LAB — SARS CORONAVIRUS 2 (TAT 6-24 HRS): SARS Coronavirus 2: NEGATIVE

## 2019-11-18 NOTE — Progress Notes (Signed)
Patient denies shortness of breath, fever, cough and chest pain.  PCP -  Dr Saralyn Pilar Cardiologist - denies Gastro - Dr Melodie Bouillon  Chest x-ray - denies EKG - denies Stress Test - denies ECHO - denies Cardiac Cath - denies  STOP now taking any Aspirin (unless otherwise instructed by your surgeon), Aleve, Naproxen, Ibuprofen, Motrin, Advil, Goody's, BC's, all herbal medications, fish oil, and all vitamins.   Coronavirus Screening Have you experienced the following symptoms:  Cough yes/no: No Fever (>100.18F)  yes/no: No Runny nose yes/no: No Sore throat yes/no: No Difficulty breathing/shortness of breath  yes/no: No  Have you traveled in the last 14 days and where? yes/no: No  Patient verbalized understanding of instructions that were given via phone.

## 2019-11-22 ENCOUNTER — Encounter (HOSPITAL_COMMUNITY): Payer: Self-pay | Admitting: Gastroenterology

## 2019-11-22 ENCOUNTER — Other Ambulatory Visit: Payer: Self-pay

## 2019-11-22 ENCOUNTER — Ambulatory Visit (HOSPITAL_COMMUNITY): Payer: Medicare Other | Admitting: Anesthesiology

## 2019-11-22 ENCOUNTER — Encounter (HOSPITAL_COMMUNITY)
Admission: RE | Disposition: A | Discharge status: Home / Self Care | Payer: Self-pay | Source: Home / Self Care | Attending: Gastroenterology

## 2019-11-22 ENCOUNTER — Ambulatory Visit (HOSPITAL_COMMUNITY)
Admission: RE | Admit: 2019-11-22 | Discharge: 2019-11-22 | Disposition: A | Discharge status: Home / Self Care | Payer: Medicare Other | Attending: Gastroenterology | Admitting: Gastroenterology

## 2019-11-22 DIAGNOSIS — Z803 Family history of malignant neoplasm of breast: Secondary | ICD-10-CM | POA: Diagnosis not present

## 2019-11-22 DIAGNOSIS — Z91048 Other nonmedicinal substance allergy status: Secondary | ICD-10-CM | POA: Diagnosis not present

## 2019-11-22 DIAGNOSIS — Z8489 Family history of other specified conditions: Secondary | ICD-10-CM | POA: Insufficient documentation

## 2019-11-22 DIAGNOSIS — Z882 Allergy status to sulfonamides status: Secondary | ICD-10-CM | POA: Diagnosis not present

## 2019-11-22 DIAGNOSIS — J45909 Unspecified asthma, uncomplicated: Secondary | ICD-10-CM | POA: Insufficient documentation

## 2019-11-22 DIAGNOSIS — Z880 Allergy status to penicillin: Secondary | ICD-10-CM | POA: Diagnosis not present

## 2019-11-22 DIAGNOSIS — Z888 Allergy status to other drugs, medicaments and biological substances status: Secondary | ICD-10-CM | POA: Diagnosis not present

## 2019-11-22 DIAGNOSIS — Z6833 Body mass index (BMI) 33.0-33.9, adult: Secondary | ICD-10-CM | POA: Insufficient documentation

## 2019-11-22 DIAGNOSIS — E785 Hyperlipidemia, unspecified: Secondary | ICD-10-CM | POA: Insufficient documentation

## 2019-11-22 DIAGNOSIS — Z8249 Family history of ischemic heart disease and other diseases of the circulatory system: Secondary | ICD-10-CM | POA: Insufficient documentation

## 2019-11-22 DIAGNOSIS — E669 Obesity, unspecified: Secondary | ICD-10-CM | POA: Diagnosis not present

## 2019-11-22 DIAGNOSIS — Z91040 Latex allergy status: Secondary | ICD-10-CM | POA: Insufficient documentation

## 2019-11-22 DIAGNOSIS — Z91041 Radiographic dye allergy status: Secondary | ICD-10-CM | POA: Diagnosis not present

## 2019-11-22 DIAGNOSIS — K219 Gastro-esophageal reflux disease without esophagitis: Secondary | ICD-10-CM | POA: Insufficient documentation

## 2019-11-22 DIAGNOSIS — I781 Nevus, non-neoplastic: Secondary | ICD-10-CM | POA: Insufficient documentation

## 2019-11-22 DIAGNOSIS — M8580 Other specified disorders of bone density and structure, unspecified site: Secondary | ICD-10-CM | POA: Diagnosis not present

## 2019-11-22 DIAGNOSIS — K449 Diaphragmatic hernia without obstruction or gangrene: Secondary | ICD-10-CM | POA: Insufficient documentation

## 2019-11-22 DIAGNOSIS — Z79899 Other long term (current) drug therapy: Secondary | ICD-10-CM | POA: Insufficient documentation

## 2019-11-22 DIAGNOSIS — Z8371 Family history of colonic polyps: Secondary | ICD-10-CM | POA: Insufficient documentation

## 2019-11-22 DIAGNOSIS — K228 Other specified diseases of esophagus: Secondary | ICD-10-CM | POA: Diagnosis not present

## 2019-11-22 DIAGNOSIS — Z823 Family history of stroke: Secondary | ICD-10-CM | POA: Diagnosis not present

## 2019-11-22 DIAGNOSIS — K317 Polyp of stomach and duodenum: Secondary | ICD-10-CM | POA: Diagnosis not present

## 2019-11-22 DIAGNOSIS — E039 Hypothyroidism, unspecified: Secondary | ICD-10-CM | POA: Insufficient documentation

## 2019-11-22 DIAGNOSIS — Z886 Allergy status to analgesic agent status: Secondary | ICD-10-CM | POA: Diagnosis not present

## 2019-11-22 DIAGNOSIS — D500 Iron deficiency anemia secondary to blood loss (chronic): Secondary | ICD-10-CM | POA: Diagnosis not present

## 2019-11-22 HISTORY — PX: HEMOSTASIS CLIP PLACEMENT: SHX6857

## 2019-11-22 HISTORY — DX: Hypothyroidism, unspecified: E03.9

## 2019-11-22 HISTORY — PX: HEMOSTASIS CONTROL: SHX6838

## 2019-11-22 HISTORY — PX: ENDOSCOPIC MUCOSAL RESECTION: SHX6839

## 2019-11-22 HISTORY — PX: SUBMUCOSAL LIFTING INJECTION: SHX6855

## 2019-11-22 HISTORY — PX: ESOPHAGOGASTRODUODENOSCOPY (EGD) WITH PROPOFOL: SHX5813

## 2019-11-22 SURGERY — ESOPHAGOGASTRODUODENOSCOPY (EGD) WITH PROPOFOL
Anesthesia: Monitor Anesthesia Care

## 2019-11-22 MED ORDER — OMEPRAZOLE 40 MG PO CPDR: 40.0000 mg | DELAYED_RELEASE_CAPSULE | Freq: Every day | ORAL | 2 refills | Status: DC

## 2019-11-22 MED ORDER — OMEPRAZOLE 40 MG PO CPDR: 40.0000 mg | DELAYED_RELEASE_CAPSULE | Freq: Two times a day (BID) | ORAL | 0 refills | Status: DC

## 2019-11-22 MED ORDER — LACTATED RINGERS IV SOLN
INTRAVENOUS | Status: AC | PRN
  Administered 2019-11-22: 1000 mL via INTRAVENOUS

## 2019-11-22 MED ORDER — PHENYLEPHRINE 40 MCG/ML (10ML) SYRINGE FOR IV PUSH (FOR BLOOD PRESSURE SUPPORT): PREFILLED_SYRINGE | INTRAVENOUS | Status: DC | PRN

## 2019-11-22 MED ORDER — SODIUM CHLORIDE 0.9 % IV SOLN: INTRAVENOUS | Status: DC

## 2019-11-22 MED ORDER — EPINEPHRINE 1 MG/10ML IJ SOSY
PREFILLED_SYRINGE | INTRAMUSCULAR | Status: AC
  Filled 2019-11-22: qty 10

## 2019-11-22 MED ORDER — PROPOFOL 10 MG/ML IV BOLUS
INTRAVENOUS | Status: DC | PRN
  Administered 2019-11-22: 20 mg via INTRAVENOUS
  Administered 2019-11-22: 30 mg via INTRAVENOUS
  Administered 2019-11-22 (×2): 20 mg via INTRAVENOUS
  Administered 2019-11-22: 50 mg via INTRAVENOUS

## 2019-11-22 MED ORDER — LIDOCAINE 2% (20 MG/ML) 5 ML SYRINGE
INTRAMUSCULAR | Status: DC | PRN
  Administered 2019-11-22: 100 mg via INTRAVENOUS

## 2019-11-22 MED ORDER — SODIUM CHLORIDE (PF) 0.9 % IJ SOLN
PREFILLED_SYRINGE | INTRAMUSCULAR | Status: DC | PRN
  Administered 2019-11-22: 15:00:00 1.5 mL

## 2019-11-22 MED ORDER — PROPOFOL 500 MG/50ML IV EMUL
INTRAVENOUS | Status: DC | PRN
  Administered 2019-11-22: 100 ug/kg/min via INTRAVENOUS

## 2019-11-22 SURGICAL SUPPLY — 14 items

## 2019-11-22 NOTE — Anesthesia Preprocedure Evaluation (Addendum)
Anesthesia Evaluation  Patient identified by MRN, date of birth, ID band Patient awake    Reviewed: Allergy & Precautions, NPO status , Patient's Chart, lab work & pertinent test results  Airway Mallampati: II  TM Distance: >3 FB Neck ROM: Full    Dental no notable dental hx.    Pulmonary asthma ,    Pulmonary exam normal breath sounds clear to auscultation       Cardiovascular negative cardio ROS Normal cardiovascular exam Rhythm:Regular Rate:Normal     Neuro/Psych negative neurological ROS  negative psych ROS   GI/Hepatic Neg liver ROS, hiatal hernia, GERD  ,  Endo/Other  Hypothyroidism   Renal/GU negative Renal ROS     Musculoskeletal negative musculoskeletal ROS (+)   Abdominal (+) + obese,   Peds  Hematology negative hematology ROS (+)   Anesthesia Other Findings IDA, Gastric Polyp  Reproductive/Obstetrics                           Anesthesia Physical Anesthesia Plan  ASA: II  Anesthesia Plan: MAC   Post-op Pain Management:    Induction: Intravenous  PONV Risk Score and Plan: 2 and Propofol infusion and Treatment may vary due to age or medical condition  Airway Management Planned: Nasal Cannula  Additional Equipment:   Intra-op Plan:   Post-operative Plan:   Informed Consent: I have reviewed the patients History and Physical, chart, labs and discussed the procedure including the risks, benefits and alternatives for the proposed anesthesia with the patient or authorized representative who has indicated his/her understanding and acceptance.     Dental advisory given  Plan Discussed with: CRNA  Anesthesia Plan Comments:        Anesthesia Quick Evaluation

## 2019-11-22 NOTE — Anesthesia Postprocedure Evaluation (Signed)
Anesthesia Post Note  Patient: Morning Halberg  Procedure(s) Performed: ESOPHAGOGASTRODUODENOSCOPY (EGD) WITH PROPOFOL (N/A ) ENDOSCOPIC MUCOSAL RESECTION (N/A ) SUBMUCOSAL LIFTING INJECTION HEMOSTASIS CONTROL HEMOSTASIS CLIP PLACEMENT     Patient location during evaluation: Endoscopy Anesthesia Type: MAC Level of consciousness: awake and alert Pain management: pain level controlled Vital Signs Assessment: post-procedure vital signs reviewed and stable Respiratory status: spontaneous breathing, nonlabored ventilation, respiratory function stable and patient connected to nasal cannula oxygen Cardiovascular status: blood pressure returned to baseline and stable Postop Assessment: no apparent nausea or vomiting Anesthetic complications: no    Last Vitals:  Vitals:   11/22/19 1510 11/22/19 1525  BP: (!) 155/72 (!) 149/71  Pulse: 65 64  Resp: 13 14  Temp:    SpO2: 100% 100%    Last Pain:  Vitals:   11/22/19 1525  TempSrc:   PainSc: 0-No pain                 Zimere Dunlevy DANIEL

## 2019-11-22 NOTE — Interval H&P Note (Signed)
History and Physical Interval Note:  11/22/2019 1:50 PM  Amanda Pierce  has presented today for surgery, with the diagnosis of IDA, Gastric Polyp.  The various methods of treatment have been discussed with the patient and family. After consideration of risks, benefits and other options for treatment, the patient has consented to  Procedure(s): ESOPHAGOGASTRODUODENOSCOPY (EGD) WITH PROPOFOL (N/A) ENDOSCOPIC MUCOSAL RESECTION (N/A) as a surgical intervention.  The patient's history has been reviewed, patient examined, no change in status, stable for surgery.  I have reviewed the patient's chart and labs.  Questions were answered to the patient's satisfaction.     [employer]

## 2019-11-22 NOTE — Transfer of Care (Signed)
Immediate Anesthesia Transfer of Care Note  Patient: Amanda Pierce  Procedure(s) Performed: ESOPHAGOGASTRODUODENOSCOPY (EGD) WITH PROPOFOL (N/A ) ENDOSCOPIC MUCOSAL RESECTION (N/A ) SUBMUCOSAL LIFTING INJECTION HEMOSTASIS CONTROL HEMOSTASIS CLIP PLACEMENT  Patient Location: Endoscopy Unit  Anesthesia Type:MAC  Level of Consciousness: drowsy  Airway & Oxygen Therapy: Patient Spontanous Breathing and Patient connected to nasal cannula oxygen  Post-op Assessment: Report given to RN and Post -op Vital signs reviewed and stable  Post vital signs: Reviewed and stable  Last Vitals:  Vitals Value Taken Time  BP    Temp    Pulse 74 11/22/19 1459  Resp 18 11/22/19 1459  SpO2 100 % 11/22/19 1459  Vitals shown include unvalidated device data.  Last Pain:  Vitals:   11/22/19 1106  TempSrc: Temporal  PainSc: 0-No pain         Complications: No apparent anesthesia complications

## 2019-11-22 NOTE — Op Note (Signed)
[facility] Patient Name: Amanda Pierce Procedure Date : 11/22/2019 MRN: [id number] Attending MD: Justice Britain , MD Date of Birth: Dec 15, 1955 CSN: [id number] Age: 64 Admit Type: Inpatient Procedure:                Upper GI endoscopy Indications:              Endoscopic removal of selected lesion in the                            stomach, For therapy of gastric polyps Providers:                Justice Britain, MD, Carlyn Reichert, RN, Lazaro Arms, Technician Referring MD:             Lennette Bihari. Bonna Gains MD, MD Medicines:                Monitored Anesthesia Care Complications:            No immediate complications. Estimated Blood Loss:     Estimated blood loss was minimal. Procedure:                Pre-Anesthesia Assessment:                           - Prior to the procedure, a History and Physical                            was performed, and patient medications and                            allergies were reviewed. The patient's tolerance of                            previous anesthesia was also reviewed. The risks                            and benefits of the procedure and the sedation                            options and risks were discussed with the patient.                            All questions were answered, and informed consent                            was obtained. Prior Anticoagulants: The patient has                            taken no previous anticoagulant or antiplatelet                            agents. ASA Grade Assessment: II - A patient with  mild systemic disease. After reviewing the risks                            and benefits, the patient was deemed in                            satisfactory condition to undergo the procedure.                           After obtaining informed consent, the endoscope was                            passed under direct vision. Throughout the                    procedure, the patient's blood pressure, pulse, and                            oxygen saturations were monitored continuously. The                            [id number]-H190 ([id number]) Olympus gastroscope was                            introduced through the mouth, and advanced to the                            second part of duodenum. The upper GI endoscopy was                            accomplished without difficulty. The patient                            tolerated the procedure. Scope In: Scope Out: Findings:      No gross lesions were noted in the proximal esophagus and in the mid       esophagus.      An island of salmon-colored mucosa was present at 38 cm. No other       visible abnormalities were present. Biopsies were taken with a cold       forceps for histology.      The Z-line was regular and was found 39 cm from the incisors.      A single 18 mm pedunculated and sessile polyp with no bleeding and       stigmata of recent bleeding was found in the gastric antrum. It was       prolapsing in/out of the antrum into the pylorus. Preparations were made       for mucosal resection. A 1:10,000 solution of epinephrine was injected       to raise the lesion (total of 1.5 mL). Additional saline was injected to       aid in lesion lift. Snare mucosal resection was performed. Resection and       retrieval were complete. To prevent bleeding after mucosal resection,       four hemostatic clips were successfully placed (MR conditional). There       was no bleeding during, or at the  end, of the procedure.      No other gross lesions were noted in the entire examined stomach.      No gross lesions were noted in the duodenal bulb, in the first portion       of the duodenum and in the second portion of the duodenum. Impression:               - No gross lesions in esophagus proximally.                            Salmon-colored mucosa island, biopsied.                           -  Z-line regular, 39 cm from the incisors.                           - A single gastric polyp prolapsing in/out of                            pylorus. Resected and retrieved via mucosal                            resection Clips (MR conditional) were placed.                           - No other gross lesions in the stomach.                           - No gross lesions in the duodenal bulb, in the                            first portion of the duodenum and in the second                            portion of the duodenum. Recommendation:           - The patient will be observed post-procedure,                            until all discharge criteria are met.                           - Discharge patient to home.                           - Patient has a contact number available for                            emergencies. The signs and symptoms of potential                            delayed complications were discussed with the                            patient. Return to normal activities tomorrow.  Written discharge instructions were provided to the                            patient.                           - Full liquid diet today.                           - Start Omeprazole 40 mg twice daily for 30 days.                            Then transition to Omeprazole 40 mg daily                            thereafter.                           - Await pathology results.                           - Repeat upper endoscopy for surveillance based on                            pathology results.                           - If pathology is consistent with Peutz-Jegher's                            hamartomas then strongly consider VCE by referring                            provider. If IDA persists will likely need VCE.                           - The findings and recommendations were discussed                            with the patient.                           - The  findings and recommendations were discussed                            with the patient's family. Procedure Code(s):        --- Professional ---                           [phone], Esophagogastroduodenoscopy, flexible,                            transoral; with endoscopic mucosal resection Diagnosis Code(s):        --- Professional ---                           K22.8, Other  specified diseases of esophagus                           K31.7, Polyp of stomach and duodenum                           K31.9, Disease of stomach and duodenum, unspecified CPT copyright 2019 American Medical Association. All rights reserved. The codes documented in this report are preliminary and upon coder review may  be revised to meet current compliance requirements. Justice Britain, MD 11/22/2019 3:21:10 PM Number of Addenda: 0

## 2019-11-22 NOTE — Discharge Instructions (Signed)
YOU HAD AN ENDOSCOPIC PROCEDURE TODAY: Refer to the procedure report and other information in the discharge instructions given to you for any specific questions about what was found during the examination. If this information does not answer your questions, please call [place] office at [phone] to clarify.   YOU SHOULD EXPECT: Some feelings of bloating in the abdomen. Passage of more gas than usual. Walking can help get rid of the air that was put into your GI tract during the procedure and reduce the bloating. If you had a lower endoscopy (such as a colonoscopy or flexible sigmoidoscopy) you may notice spotting of blood in your stool or on the toilet paper. Some abdominal soreness may be present for a day or two, also.  DIET: Your first meal following the procedure should be a light meal and then it is ok to progress to your normal diet. A half-sandwich or bowl of soup is an example of a good first meal. Heavy or fried foods are harder to digest and may make you feel nauseous or bloated. Drink plenty of fluids but you should avoid alcoholic beverages for 24 hours. If you had a esophageal dilation, please see attached instructions for diet.    ACTIVITY: Your care partner should take you home directly after the procedure. You should plan to take it easy, moving slowly for the rest of the day. You can resume normal activity the day after the procedure however YOU SHOULD NOT DRIVE, use power tools, machinery or perform tasks that involve climbing or major physical exertion for 24 hours (because of the sedation medicines used during the test).   SYMPTOMS TO REPORT IMMEDIATELY: A gastroenterologist can be reached at any hour. Please call [phone]  for any of the following symptoms:   Following upper endoscopy (EGD, EUS, ERCP, esophageal dilation) Vomiting of blood or coffee ground material  New, significant abdominal pain  New, significant chest pain or pain under the shoulder blades  Painful or  persistently difficult swallowing  New shortness of breath  Black, tarry-looking or red, bloody stools  FOLLOW UP:  If any biopsies were taken you will be contacted by phone or by letter within the next 1-3 weeks. Call [phone]  if you have not heard about the biopsies in 3 weeks.  Please also call with any specific questions about appointments or follow up tests.  

## 2019-11-24 ENCOUNTER — Encounter: Payer: Self-pay | Admitting: *Deleted

## 2019-11-24 ENCOUNTER — Encounter: Payer: Self-pay | Admitting: Gastroenterology

## 2019-11-24 LAB — SURGICAL PATHOLOGY

## 2019-12-01 ENCOUNTER — Other Ambulatory Visit: Payer: Self-pay | Admitting: Oncology

## 2019-12-01 MED ORDER — POLYSACCHARIDE IRON COMPLEX 150 MG PO CAPS: 150.0000 mg | ORAL_CAPSULE | Freq: Every day | ORAL | 0 refills | Status: DC

## 2019-12-14 ENCOUNTER — Other Ambulatory Visit: Payer: Self-pay | Admitting: Gastroenterology

## 2019-12-16 ENCOUNTER — Other Ambulatory Visit: Payer: Self-pay | Admitting: *Deleted

## 2019-12-16 MED ORDER — ASCORBIC ACID 250 MG PO TABS: ORAL_TABLET | ORAL | 1 refills | Status: DC

## 2019-12-23 ENCOUNTER — Telehealth: Payer: Self-pay

## 2019-12-23 NOTE — Telephone Encounter (Signed)
Done. Pt is aware.

## 2019-12-23 NOTE — Telephone Encounter (Signed)
Amanda Pierce please move appts per Dr. Cathie Hoops request & inform pt:   please move her appt up to 4 weeks. [facility] (cbc,iron,ferr) prior, virtual or in person. Thanks.

## 2019-12-26 ENCOUNTER — Other Ambulatory Visit: Payer: Self-pay | Admitting: Oncology

## 2019-12-28 ENCOUNTER — Inpatient Hospital Stay: Payer: Medicare Other | Attending: Oncology | Admitting: Oncology

## 2019-12-28 ENCOUNTER — Inpatient Hospital Stay: Payer: Medicare Other

## 2019-12-28 ENCOUNTER — Encounter: Payer: Self-pay | Admitting: Oncology

## 2019-12-28 ENCOUNTER — Other Ambulatory Visit: Payer: Self-pay

## 2019-12-28 VITALS — BP 151/91 | HR 78 | Temp 98.3°F | Wt 227.0 lb

## 2019-12-28 DIAGNOSIS — J45909 Unspecified asthma, uncomplicated: Secondary | ICD-10-CM | POA: Insufficient documentation

## 2019-12-28 DIAGNOSIS — R5383 Other fatigue: Secondary | ICD-10-CM | POA: Diagnosis not present

## 2019-12-28 DIAGNOSIS — Z803 Family history of malignant neoplasm of breast: Secondary | ICD-10-CM | POA: Insufficient documentation

## 2019-12-28 DIAGNOSIS — Z79899 Other long term (current) drug therapy: Secondary | ICD-10-CM | POA: Diagnosis not present

## 2019-12-28 DIAGNOSIS — Z800 Family history of malignant neoplasm of digestive organs: Secondary | ICD-10-CM | POA: Insufficient documentation

## 2019-12-28 DIAGNOSIS — K5900 Constipation, unspecified: Secondary | ICD-10-CM

## 2019-12-28 DIAGNOSIS — R109 Unspecified abdominal pain: Secondary | ICD-10-CM | POA: Diagnosis not present

## 2019-12-28 DIAGNOSIS — D509 Iron deficiency anemia, unspecified: Secondary | ICD-10-CM

## 2019-12-28 DIAGNOSIS — D649 Anemia, unspecified: Secondary | ICD-10-CM | POA: Insufficient documentation

## 2019-12-28 DIAGNOSIS — R1013 Epigastric pain: Secondary | ICD-10-CM

## 2019-12-28 DIAGNOSIS — E039 Hypothyroidism, unspecified: Secondary | ICD-10-CM | POA: Diagnosis not present

## 2019-12-28 LAB — CBC WITH DIFFERENTIAL/PLATELET
Abs Immature Granulocytes: 0.01 10*3/uL (ref 0.00–0.07)
Basophils Absolute: 0 10*3/uL (ref 0.0–0.1)
Basophils Relative: 0 %
Eosinophils Absolute: 0 10*3/uL (ref 0.0–0.5)
Eosinophils Relative: 0 %
HCT: 40.4 % (ref 36.0–46.0)
Hemoglobin: 12.1 g/dL (ref 12.0–15.0)
Immature Granulocytes: 0 %
Lymphocytes Relative: 39 %
Lymphs Abs: 2.1 10*3/uL (ref 0.7–4.0)
MCH: 23.6 pg — ABNORMAL LOW (ref 26.0–34.0)
MCHC: 30 g/dL (ref 30.0–36.0)
MCV: 78.8 fL — ABNORMAL LOW (ref 80.0–100.0)
Monocytes Absolute: 0.3 10*3/uL (ref 0.1–1.0)
Monocytes Relative: 6 %
Neutro Abs: 2.9 10*3/uL (ref 1.7–7.7)
Neutrophils Relative %: 55 %
Platelets: 301 10*3/uL (ref 150–400)
RBC: 5.13 MIL/uL — ABNORMAL HIGH (ref 3.87–5.11)
RDW: 16.7 % — ABNORMAL HIGH (ref 11.5–15.5)
WBC: 5.4 10*3/uL (ref 4.0–10.5)
nRBC: 0 % (ref 0.0–0.2)

## 2019-12-28 LAB — IRON AND TIBC
Iron: 66 ug/dL (ref 28–170)
Saturation Ratios: 16 % (ref 10.4–31.8)
TIBC: 416 ug/dL (ref 250–450)
UIBC: 350 ug/dL

## 2019-12-28 LAB — FERRITIN: Ferritin: 10 ng/mL — ABNORMAL LOW (ref 11–307)

## 2019-12-28 NOTE — Progress Notes (Signed)
Patient here for follow up. Pt reports she is having poor appetite, constipation and has been more tired . Pt reports soreness to upper abdomen ever since she had her endoscopy.

## 2019-12-29 NOTE — Progress Notes (Signed)
Hematology/Oncology follow up note [facility] [phone] [phone]   Patient Care Team: Smitty Cords, DO as PCP - General (Family Medicine)  REFERRING PROVIDER: Saralyn Pilar * CHIEF COMPLAINTS/REASON FOR VISIT:  Evaluation of iron deficiency anemia  HISTORY OF PRESENTING ILLNESS:  Amanda Pierce is a  64 y.o.  female with PMH listed below was seen in consultation at the request of Saralyn Pilar *   for evaluation of iron deficiency anemia.   Reviewed patient's recent labs  10/11/2019 labs revealed anemia with hemoglobin of 11.7, MCV 79.8.Marland Kitchen   Reviewed patient's previous labs ordered by primary care physician's office, anemia is chronic onset , duration is since April 2020. No aggravating or improving factors.  Associated signs and symptoms: Patient reports fatigue.  Denies SOB with exertion.  Denies weight loss, easy bruising, hematochezia, hemoptysis, hematuria. Context:  History of iron deficiency: She took oral iron supplementation from April to September 2020 and stopped after iron panel improves. Rectal bleeding: Denies Menstrual bleeding/ Vaginal bleeding : Denies.  Postmenopausal. Hematemesis or hemoptysis : denies Blood in urine : denies   Last endoscopy: Patient has establish care with gastroenterology and has upcoming EGD/colonoscopy next week. Fatigue: Yes.  SOB: deneis  Patient currently takes ferrous gluconate 324 mg once daily.  She previously was on ferrous sulfate and had severe constipation.  INTERVAL HISTORY Amanda Pierce is a 64 y.o. female who has above history reviewed by me today presents for follow up visit for management of iron deficiency anemia Problems and complaints are listed below: Patient continues to feel fatigued and tired.  She takes oral iron supplementation. Reports upper abdomen soreness after endoscopy. Patient had blood work done present to discuss about results and  further management plan. 10/28/2019 upper endoscopy-ectopic gastric mucosa in the proximal esophagus.  Gastric mucosal atrophy.  Single gastric polyp.  GI felt that patient's abdominal pain likely secondary to gastric polyp which was biopsied.  Patient was referred to Dr. Meridee Score for gastric polyp removal.  Gastric polyp showed hyperplastic gastric polyp, no metaplastic, adenomatous change or carcinoma. 10/28/2019 colonoscopy examination was normal.  No specimen was collected.   Review of Systems  Constitutional: Positive for appetite change and fatigue. Negative for chills and fever.  HENT:   Negative for hearing loss and voice change.   Eyes: Negative for eye problems.  Respiratory: Negative for chest tightness and cough.   Cardiovascular: Negative for chest pain.  Gastrointestinal: Positive for constipation. Negative for abdominal distention and blood in stool.  Endocrine: Negative for hot flashes.  Genitourinary: Negative for difficulty urinating and frequency.   Musculoskeletal: Negative for arthralgias.  Skin: Negative for itching and rash.  Neurological: Negative for extremity weakness.  Hematological: Negative for adenopathy.  Psychiatric/Behavioral: Negative for confusion.    MEDICAL HISTORY:  Past Medical History:  Diagnosis Date  . Allergy   . Anemia   . Asthma   . Hypothyroidism   . Thyroid disease    hypothyroid    SURGICAL HISTORY: Past Surgical History:  Procedure Laterality Date  . BREAST MASS EXCISION Left 1974   benign  . COLONOSCOPY WITH PROPOFOL N/A 10/28/2019   Procedure: COLONOSCOPY WITH PROPOFOL;  Surgeon: Pasty Spillers, MD;  Location: [facility] ENDOSCOPY;  Service: Endoscopy;  Laterality: N/A;  . ENDOSCOPIC MUCOSAL RESECTION N/A 11/22/2019   Procedure: ENDOSCOPIC MUCOSAL RESECTION;  Surgeon: Meridee Score Netty Starring., MD;  Location: [facility] ENDOSCOPY;  Service: Gastroenterology;  Laterality: N/A;  . ESOPHAGOGASTRODUODENOSCOPY (EGD) WITH PROPOFOL N/A  10/28/2019  Procedure: ESOPHAGOGASTRODUODENOSCOPY (EGD) WITH PROPOFOL;  Surgeon: Pasty Spillers, MD;  Location: [facility] ENDOSCOPY;  Service: Endoscopy;  Laterality: N/A;  . ESOPHAGOGASTRODUODENOSCOPY (EGD) WITH PROPOFOL N/A 11/22/2019   Procedure: ESOPHAGOGASTRODUODENOSCOPY (EGD) WITH PROPOFOL;  Surgeon: Meridee Score Netty Starring., MD;  Location: [facility] ENDOSCOPY;  Service: Gastroenterology;  Laterality: N/A;  . HEMOSTASIS CLIP PLACEMENT  11/22/2019   Procedure: HEMOSTASIS CLIP PLACEMENT;  Surgeon: Lemar Lofty., MD;  Location: [facility] ENDOSCOPY;  Service: Gastroenterology;;  . HEMOSTASIS CONTROL  11/22/2019   Procedure: HEMOSTASIS CONTROL;  Surgeon: Lemar Lofty., MD;  Location: [facility] ENDOSCOPY;  Service: Gastroenterology;;  epi   . nodule removed  1982   from thyroid -benign  . PARATHYROIDECTOMY  1982   patient denies this dx  . SUBMUCOSAL LIFTING INJECTION  11/22/2019   Procedure: SUBMUCOSAL LIFTING INJECTION;  Surgeon: Meridee Score Netty Starring., MD;  Location: [facility] ENDOSCOPY;  Service: Gastroenterology;;  . TUBAL LIGATION    . WISDOM TOOTH EXTRACTION      SOCIAL HISTORY: Social History   Socioeconomic History  . Marital status: Married    Spouse name: Not on file  . Number of children: Not on file  . Years of education: Not on file  . Highest education level: Not on file  Occupational History  . Occupation: retired  Tobacco Use  . Smoking status: Never Smoker  . Smokeless tobacco: Never Used  Substance and Sexual Activity  . Alcohol use: Never  . Drug use: Never  . Sexual activity: Not on file  Other Topics Concern  . Not on file  Social History Narrative  . Not on file   Social Determinants of Health   Financial Resource Strain:   . Difficulty of Paying Living Expenses: Not on file  Food Insecurity:   . Worried About Programme researcher, broadcasting/film/video in the Last Year: Not on file  . Ran Out of Food in the Last Year: Not on file  Transportation Needs:   . Lack of  Transportation (Medical): Not on file  . Lack of Transportation (Non-Medical): Not on file  Physical Activity:   . Days of Exercise per Week: Not on file  . Minutes of Exercise per Session: Not on file  Stress:   . Feeling of Stress : Not on file  Social Connections:   . Frequency of Communication with Friends and Family: Not on file  . Frequency of Social Gatherings with Friends and Family: Not on file  . Attends Religious Services: Not on file  . Active Member of Clubs or Organizations: Not on file  . Attends Banker Meetings: Not on file  . Marital Status: Not on file  Intimate Partner Violence:   . Fear of Current or Ex-Partner: Not on file  . Emotionally Abused: Not on file  . Physically Abused: Not on file  . Sexually Abused: Not on file    FAMILY HISTORY: Family History  Problem Relation Age of Onset  . Cancer Mother        Breast, Paget's removed nipple 71, breast 2001  . Colon polyps Mother   . Paget's disease of bone Mother   . Hypertension Mother   . Hyperlipidemia Mother   . Heart disease Father   . Heart attack Father   . Heart disease Maternal Grandfather   . Stroke Paternal Grandmother   . Colon cancer Neg Hx   . Esophageal cancer Neg Hx   . Inflammatory bowel disease Neg Hx   . Liver disease Neg Hx   .  Pancreatic cancer Neg Hx   . Rectal cancer Neg Hx   . Stomach cancer Neg Hx     ALLERGIES:  is allergic to aspirin; contrast media [iodinated diagnostic agents]; ibuprofen; latex; penicillins; rubbing alcohol [alcohol]; sulfur; and adhesive [tape].  MEDICATIONS:  Current Outpatient Medications  Medication Sig Dispense Refill  . albuterol (VENTOLIN HFA) 108 (90 Base) MCG/ACT inhaler Inhale 2 puffs into the lungs every 6 (six) hours as needed for wheezing or shortness of breath.    Marland Kitchen ascorbic acid ([employer] VITAMIN C) 250 MG tablet TAKE 2 TABLETS (500 MG TOTAL) BY MOUTH DAILY. 60 tablet 1  . Cholecalciferol (VITAMIN D3) 125 MCG (5000 UT) CAPS Take  1 capsule (5,000 Units total) by mouth daily. For 8 weeks, then start Vitamin D3 2,000 units daily (OTC) 60 capsule 0  . fexofenadine (ALLEGRA) 180 MG tablet Take 180 mg by mouth daily as needed for allergies or rhinitis.    Marland Kitchen montelukast (SINGULAIR) 10 MG tablet Take 1 tablet (10 mg total) by mouth at bedtime as needed. 90 tablet 3  . omeprazole (PRILOSEC) 40 MG capsule Take 1 capsule (40 mg total) by mouth daily. Start this after completing 1 month of twice daily Omeprazole. 30 capsule 2  . SYNTHROID 150 MCG tablet Take 150 mcg by mouth daily before breakfast. BRAND ONLY 90 tablet 0  . EPINEPHrine 0.3 mg/0.3 mL IJ SOAJ injection Inject 0.3 mg into the muscle as needed for anaphylaxis.    Marland Kitchen FERREX 150 150 MG capsule TAKE 1 CAPSULE BY MOUTH EVERY DAY 30 capsule 0  . omeprazole (PRILOSEC) 40 MG capsule Take 1 capsule (40 mg total) by mouth daily. (Patient not taking: Reported on 12/28/2019) 30 capsule 2   No current facility-administered medications for this visit.     PHYSICAL EXAMINATION: ECOG PERFORMANCE STATUS: 1 - Symptomatic but completely ambulatory Vitals:   12/28/19 1325  BP: (!) 151/91  Pulse: 78  Temp: 98.3 F (36.8 C)   Filed Weights   12/28/19 1325  Weight: 227 lb (103 kg)    Physical Exam Constitutional:      General: She is not in acute distress. HENT:     Head: Normocephalic and atraumatic.  Eyes:     General: No scleral icterus.    Pupils: Pupils are equal, round, and reactive to light.  Cardiovascular:     Rate and Rhythm: Normal rate and regular rhythm.     Heart sounds: Normal heart sounds.  Pulmonary:     Effort: Pulmonary effort is normal. No respiratory distress.     Breath sounds: No wheezing.  Abdominal:     General: Bowel sounds are normal. There is no distension.     Palpations: Abdomen is soft. There is no mass.     Tenderness: There is no abdominal tenderness.  Musculoskeletal:        General: No deformity. Normal range of motion.     Cervical  back: Normal range of motion and neck supple.  Skin:    General: Skin is warm and dry.     Findings: No erythema or rash.  Neurological:     Mental Status: She is alert and oriented to person, place, and time. Mental status is at baseline.     Cranial Nerves: No cranial nerve deficit.     Coordination: Coordination normal.  Psychiatric:        Mood and Affect: Mood normal.        Behavior: Behavior normal.  Thought Content: Thought content normal.       CMP Latest Ref Rng & Units 11/10/2019  Glucose 70 - 99 mg/dL 108(H)  BUN 6 - 23 mg/dL 12  Creatinine 0.40 - 1.20 mg/dL 0.82  Sodium 135 - 145 mEq/L 138  Potassium 3.5 - 5.1 mEq/L 4.1  Chloride 96 - 112 mEq/L 106  CO2 19 - 32 mEq/L 26  Calcium 8.4 - 10.5 mg/dL 8.9  Total Protein 6.1 - 8.1 g/dL -  Total Bilirubin 0.2 - 1.2 mg/dL -  Alkaline Phos 25 - 125 -  AST 10 - 35 U/L -  ALT 6 - 29 U/L -   CBC Latest Ref Rng & Units 12/28/2019  WBC 4.0 - 10.5 K/uL 5.4  Hemoglobin 12.0 - 15.0 g/dL 12.1  Hematocrit 36.0 - 46.0 % 40.4  Platelets 150 - 400 K/uL 301     LABORATORY DATA:  I have reviewed the data as listed Lab Results  Component Value Date   WBC 5.4 12/28/2019   HGB 12.1 12/28/2019   HCT 40.4 12/28/2019   MCV 78.8 (L) 12/28/2019   PLT 301 12/28/2019   Recent Labs    02/23/19 0829 11/10/19 1225  NA 141 138  K 5.0 4.1  CL 105 106  CO2 29 26  GLUCOSE 111* 108*  BUN 16 12  CREATININE 0.81 0.82  CALCIUM 9.3 8.9  GFRNONAA 77  --   GFRAA 89  --   PROT 7.1  --   AST 13  --   ALT 9  --   BILITOT 0.5  --    Iron/TIBC/Ferritin/ %Sat    Component Value Date/Time   IRON 66 12/28/2019 1257   TIBC 416 12/28/2019 1257   FERRITIN 10 (L) 12/28/2019 1257   IRONPCTSAT 16 12/28/2019 1257   IRONPCTSAT 9 (L) 10/11/2019 0940     No results found.    ASSESSMENT & PLAN:  1. Iron deficiency anemia, unspecified iron deficiency anemia type   2. Epigastric discomfort   3. Constipation, unspecified constipation  type    Iron deficiency anemia, Patient has been taking oral iron gluconate with vitamin C supplements Ferritin is slightly improved at 10.  Iron saturation improved to 16%.  TIBC 416. Hemoglobin slightly improved and normalized. Recommend patient to continue Ferrex 150 mg daily.  Refills were sent.  Epigastric discomfort, recent gastric polyp resection.  I recommend patient to continue follow-up with gastroenterology.  Continue omeprazole. Constipation, recommend patient to increase fiber in diet.  He may take stool softener.  Repeat blood work in 4 months.   Orders Placed This Encounter  Procedures  . CBC with Differential/Platelet    Standing Status:   Future    Standing Expiration Date:   04/27/2021  . Ferritin    Standing Status:   Future    Standing Expiration Date:   04/27/2021  . Iron and TIBC    Standing Status:   Future    Standing Expiration Date:   04/27/2021    All questions were answered. The patient knows to call the clinic with any problems questions or concerns.  Cc Nobie Putnam *  Return of visit  4 months  Earlie Server, MD, PhD Hematology Oncology [facility] Pager- [phone] 12/29/2019

## 2020-01-08 ENCOUNTER — Ambulatory Visit: Payer: Medicare Other | Attending: Internal Medicine

## 2020-01-08 DIAGNOSIS — Z23 Encounter for immunization: Secondary | ICD-10-CM

## 2020-01-08 NOTE — Progress Notes (Signed)
   Covid-19 Vaccination Clinic  Name:  Amanda Pierce    MRN: [id number] DOB: 1956/02/10  01/08/2020  [name] was observed post Covid-19 immunization for 30 minutes based on pre-vaccination screening without incident. She was provided with Vaccine Information Sheet and instruction to access the V-Safe system.   [name] was instructed to call 911 with any severe reactions post vaccine: Marland Kitchen Difficulty breathing  . Swelling of face and throat  . A fast heartbeat  . A bad rash all over body  . Dizziness and weakness   Immunizations Administered    Name Date Dose VIS Date Route   Pfizer COVID-19 Vaccine 01/08/2020  2:09 PM 0.3 mL 10/08/2019 Intramuscular   Manufacturer: [employer], [employer]   Lot: [id number]   NDC: [address]-8

## 2020-01-11 ENCOUNTER — Other Ambulatory Visit: Payer: Self-pay | Admitting: Oncology

## 2020-01-11 DIAGNOSIS — D508 Other iron deficiency anemias: Secondary | ICD-10-CM

## 2020-01-17 ENCOUNTER — Encounter: Payer: Self-pay | Admitting: Gastroenterology

## 2020-01-18 ENCOUNTER — Ambulatory Visit (INDEPENDENT_AMBULATORY_CARE_PROVIDER_SITE_OTHER): Payer: Medicare Other | Admitting: Gastroenterology

## 2020-01-18 ENCOUNTER — Other Ambulatory Visit: Payer: Self-pay | Admitting: Oncology

## 2020-01-18 ENCOUNTER — Encounter: Payer: Self-pay | Admitting: Gastroenterology

## 2020-01-18 DIAGNOSIS — D508 Other iron deficiency anemias: Secondary | ICD-10-CM | POA: Diagnosis not present

## 2020-01-18 DIAGNOSIS — K5900 Constipation, unspecified: Secondary | ICD-10-CM

## 2020-01-18 MED ORDER — OMEPRAZOLE 20 MG PO CPDR: 20.0000 mg | DELAYED_RELEASE_CAPSULE | Freq: Every day | ORAL | 0 refills | Status: DC

## 2020-01-18 MED ORDER — POLYSACCHARIDE IRON COMPLEX 150 MG PO CAPS: 150.0000 mg | ORAL_CAPSULE | Freq: Every day | ORAL | 2 refills | Status: DC

## 2020-01-18 MED ORDER — LINACLOTIDE 145 MCG PO CAPS: 145.0000 ug | ORAL_CAPSULE | Freq: Every day | ORAL | 3 refills | Status: DC

## 2020-01-18 NOTE — Progress Notes (Signed)
Amanda Bouillon, MD [address]  [address]  [place], [place] [address]  Main: [phone]  Fax: [phone]   Primary Care Physician: Smitty Cords, DO  Virtual Visit via Video Note  I connected with patient on 01/18/20 at 10:00 AM EDT by video (using [email or web address]) and verified that I am speaking with the correct person using two identifiers.   I discussed the limitations, risks, security and privacy concerns of performing an evaluation and management service by video and the availability of in person appointments. I also discussed with the patient that there may be a patient responsible charge related to this service. The patient expressed understanding and agreed to proceed.  Location of Patient: Home Location of Provider: Home Persons involved: Patient and provider only (Nursing staff checked in patient via phone but were not physically involved in the video interaction - see their notes)   History of Present Illness: CC: Constipation, abdominal pain  HPI: Amanda Pierce is a 64 y.o. female seen last year for Iron def anemia and abdominal pain. EGD showed atrophic gastric mucosa, and a single pedunculated polyp at the antrum. Colonoscopy was normal. Pt was referred to Dr. Meridee Score for removal of the polyp. She underwent EMR and pathology showed hyperplastic gastric polyp.   As per Dr. Meridee Score:  "Polyp returned as a hyperplastic polyp not a Peutz-Jegher polyp. I recommend iron indices and a blood count in 4 to 6 weeks with either yourself or with her hematologist in [place]. I suspect if she remains iron deficient she will benefit from a video capsule endoscopy. However, I do agree she will likely benefit from this being removed. 1-year follow up EGD recommended. "  Pt sees hematology who is monitoring and prescribing iron replacement. Labs 5 weeks post polypectomy showed improved serum Iron, but still low ferritin.   Patient reports ongoing constipation  despite using Colace once or twice a day.  Was also on MiraLAX previously.  Reports epigastric pain that she states Dr. Cathie Hoops evaluated and thought it may be coming from her ribs.  She states it is right under her ribs and increases on palpation at times.  No nausea or vomiting.  No loss of appetite.  Not related to meals.    Current Outpatient Medications  Medication Sig Dispense Refill  . ascorbic acid ([employer] VITAMIN C) 250 MG tablet TAKE 2 TABLETS (500 MG TOTAL) BY MOUTH DAILY. 60 tablet 1  . Cholecalciferol (VITAMIN D3) 125 MCG (5000 UT) CAPS Take 1 capsule (5,000 Units total) by mouth daily. For 8 weeks, then start Vitamin D3 2,000 units daily (OTC) 60 capsule 0  . fexofenadine (ALLEGRA) 180 MG tablet Take 180 mg by mouth daily as needed for allergies or rhinitis.    Marland Kitchen montelukast (SINGULAIR) 10 MG tablet Take 1 tablet (10 mg total) by mouth at bedtime as needed. 90 tablet 3  . omeprazole (PRILOSEC) 40 MG capsule Take 1 capsule (40 mg total) by mouth daily. Start this after completing 1 month of twice daily Omeprazole. 30 capsule 2  . SYNTHROID 150 MCG tablet Take 150 mcg by mouth daily before breakfast. BRAND ONLY 90 tablet 0  . FERREX 150 150 MG capsule TAKE 1 CAPSULE BY MOUTH EVERY DAY (Patient not taking: Reported on 01/17/2020) 30 capsule 0   No current facility-administered medications for this visit.    Allergies as of 01/18/2020 - Review Complete 01/17/2020  Allergen Reaction Noted  . Aspirin Anaphylaxis 09/01/2018  . Contrast media [iodinated diagnostic agents]  Nausea And Vomiting and Palpitations 09/01/2018  . Ibuprofen Anaphylaxis 09/01/2018  . Latex Rash 09/01/2018  . Penicillins Anaphylaxis 09/01/2018  . Rubbing alcohol [alcohol] Other (See Comments) 09/01/2018  . Sulfur Hives and Rash 09/01/2018  . Adhesive [tape]  10/19/2019    Review of Systems:    All systems reviewed and negative except where noted in HPI.   Observations/Objective:  Labs: CMP     Component Value  Date/Time   NA 138 11/10/2019 1225   NA 140 09/29/2014 0000   K 4.1 11/10/2019 1225   CL 106 11/10/2019 1225   CO2 26 11/10/2019 1225   GLUCOSE 108 (H) 11/10/2019 1225   BUN 12 11/10/2019 1225   BUN 14 09/29/2014 0000   CREATININE 0.82 11/10/2019 1225   CREATININE 0.81 02/23/2019 0829   CALCIUM 8.9 11/10/2019 1225   PROT 7.1 02/23/2019 0829   AST 13 02/23/2019 0829   ALT 9 02/23/2019 0829   ALKPHOS 85 09/29/2014 0000   BILITOT 0.5 02/23/2019 0829   GFRNONAA 77 02/23/2019 0829   GFRAA 89 02/23/2019 0829   Lab Results  Component Value Date   WBC 5.4 12/28/2019   HGB 12.1 12/28/2019   HCT 40.4 12/28/2019   MCV 78.8 (L) 12/28/2019   PLT 301 12/28/2019    Imaging Studies: No results found.  Assessment and Plan:   Amanda Pierce is a 64 y.o. y/o female with iron def anemia, gastric hyperplastic polyp s/p resection on 11/22/19 via EMR, epigastric pain  Assessment and Plan: Iron deficiency improving. Continue f/u with Hematology and iron replacement as prescribed. Labs were repeated about 5 weeks post last EGD and showed improvement. Repeat iron panel ordered by hematology for 3-4 months. If ferritin remains low, order VCE. If improves as expected, no need for VCE  Repeat EGD in 1 year  Abdominal pain/constipation Constipation may be contributing to her abdominal pain.  Start Linzess.  However, pain does seem musculoskeletal as patient states it is reproduced on palpation and on laying on the side of the pain.  No alarm symptoms present.  If pain does not improve, can consider CT scan of ultrasound of the abdomen.  PPI was recommended by Dr. Rush Landmark after polyp removal.  Initial dose was twice daily and then was supposed to be once daily after that.  She never got the once daily dosing after finishing it twice daily.  We will send the once daily prescription for 30 days to her pharmacy. (Risks of PPI use were discussed with patient including bone loss, C. Diff diarrhea,  pneumonia, infections, CKD, electrolyte abnormalities.  institution of acid reflux lifestyle modifications over time. Pt. Verbalizes understanding and chooses to continue the medication.)     Follow Up Instructions:    I discussed the assessment and treatment plan with the patient. The patient was provided an opportunity to ask questions and all were answered. The patient agreed with the plan and demonstrated an understanding of the instructions.   The patient was advised to call back or seek an in-person evaluation if the symptoms worsen or if the condition fails to improve as anticipated.  I provided 15 minutes of face-to-face time via video software during this encounter. Additional time was spent in reviewing patient's chart, placing orders etc.   Virgel Manifold, MD  Speech recognition software was used to dictate this note.

## 2020-01-23 ENCOUNTER — Other Ambulatory Visit: Payer: Self-pay | Admitting: Oncology

## 2020-01-29 ENCOUNTER — Ambulatory Visit: Payer: Medicare Other | Attending: Internal Medicine

## 2020-01-29 ENCOUNTER — Ambulatory Visit: Payer: Medicare Other

## 2020-01-29 DIAGNOSIS — Z23 Encounter for immunization: Secondary | ICD-10-CM

## 2020-01-29 NOTE — Progress Notes (Signed)
   Covid-19 Vaccination Clinic  Name:  Amanda Pierce    MRN: [id number] DOB: 1956-05-02  01/29/2020  [name] was observed post Covid-19 immunization for 15 minutes without incident. She was provided with Vaccine Information Sheet and instruction to access the V-Safe system. Medical interpreter used  [name] was instructed to call 911 with any severe reactions post vaccine: Marland Kitchen Difficulty breathing  . Swelling of face and throat  . A fast heartbeat  . A bad rash all over body  . Dizziness and weakness   Immunizations Administered    Name Date Dose VIS Date Route   Pfizer COVID-19 Vaccine 01/29/2020  3:56 PM 0.3 mL 10/08/2019 Intramuscular   Manufacturer: [employer], [employer]   Lot: [phone] [phone]   NDC: [address]-7

## 2020-02-02 ENCOUNTER — Ambulatory Visit: Payer: Medicare Other

## 2020-02-12 ENCOUNTER — Other Ambulatory Visit: Payer: Self-pay | Admitting: Oncology

## 2020-02-15 ENCOUNTER — Other Ambulatory Visit: Payer: Self-pay | Admitting: Gastroenterology

## 2020-02-15 NOTE — Telephone Encounter (Signed)
Last office visit 01/18/2020 Iron def anemia  Last refill 01/18/2020  0 refills

## 2020-02-29 ENCOUNTER — Other Ambulatory Visit: Payer: Self-pay | Admitting: Gastroenterology

## 2020-02-29 NOTE — Telephone Encounter (Signed)
Last office visit 01/18/2020 iron def anemia  Last refill 02/15/2020 0 refills  No appointment is scheduled

## 2020-03-17 ENCOUNTER — Other Ambulatory Visit: Payer: Medicare Other

## 2020-03-20 ENCOUNTER — Telehealth: Payer: Medicare Other | Admitting: Oncology

## 2020-03-29 ENCOUNTER — Other Ambulatory Visit: Payer: Medicare Other

## 2020-04-14 ENCOUNTER — Other Ambulatory Visit: Payer: Medicare Other

## 2020-04-17 ENCOUNTER — Other Ambulatory Visit: Payer: Medicare Other

## 2020-04-17 ENCOUNTER — Ambulatory Visit: Payer: Medicare Other | Admitting: Oncology

## 2020-04-25 ENCOUNTER — Encounter: Payer: Self-pay | Admitting: Family Medicine

## 2020-04-25 ENCOUNTER — Ambulatory Visit (INDEPENDENT_AMBULATORY_CARE_PROVIDER_SITE_OTHER): Payer: Medicare Other | Admitting: Family Medicine

## 2020-04-25 ENCOUNTER — Other Ambulatory Visit: Payer: Self-pay

## 2020-04-25 VITALS — BP 136/80 | HR 65 | Resp 16 | Ht 68.0 in | Wt 228.6 lb

## 2020-04-25 DIAGNOSIS — R030 Elevated blood-pressure reading, without diagnosis of hypertension: Secondary | ICD-10-CM | POA: Diagnosis not present

## 2020-04-25 DIAGNOSIS — R7303 Prediabetes: Secondary | ICD-10-CM | POA: Diagnosis not present

## 2020-04-25 DIAGNOSIS — E669 Obesity, unspecified: Secondary | ICD-10-CM

## 2020-04-25 DIAGNOSIS — E039 Hypothyroidism, unspecified: Secondary | ICD-10-CM

## 2020-04-25 LAB — POCT GLYCOSYLATED HEMOGLOBIN (HGB A1C): Hemoglobin A1C: 6.2 % — AB (ref 4.0–5.6)

## 2020-04-25 NOTE — Assessment & Plan Note (Addendum)
Persistently elevated BP Repeat manual improved, still higher than her home readings reviewed today 130/80s Known factors with stress and weight gain recently No prior dx HTN, never on meds    Plan:  1. Discussed options again - ultimately we agree one more delay in dx and treat HTN - we will encourage improved lifestyle - low sodium diet, regular exercise and weight loss with keto diet plan as discussed today in detail, and if still elevated BP or suboptimal result can start HTN medication at next visit or in near future sooner if elevated readings at home >140/90

## 2020-04-25 NOTE — Assessment & Plan Note (Signed)
Weight up 1-2 lbs in 3 months. Overall up 5-6 lbs in 5-6 months. Encourage lifestyle diet exercise regimen today Detailed counseling on keto diet and handouts given, given risk of DM with PreDM - emphasis on keto as option for weight loss. Reviewed exercise regimen with current cardio intermittent walking, advised schedule 3 x week 20-30 min cardio walking, keep timer, and increase gradually as tolerated up to 45-69min 3 x week then in future add gym or adjust exercise regimen.  Future we can refer to [facility] management clinic in [place] if limited success. Anticipate if thyroid and anemia are controlled it will help

## 2020-04-25 NOTE — Patient Instructions (Addendum)
Thank you for coming to the office today.  Go ahead and call back the Endocrinology - let them know that you did not hear results from your ultrasound and ask when to follow-up next.  --------  Keep your apt for the blood 7/12 and also 7/14 for the anemia.  --------------------------------   Recent Labs    07/09/19 0931 04/25/20 1433  HGBA1C 6.3* 6.2*    [email or web address]  ----------------------------------------  For exercise regimen - Recommend walking as you are doing currently, for up to 20-30 mins per time, goal is going to be eventually reach 150 min per week, over 3 times, so about 45 min to 60 min approximately. May increase intensity. Can always add hand-weights as well in future - Also may get to Gym and that can reduce the walking time, if you do more gym exercises as well.    Please schedule a Follow-up Appointment to: Return in about 3 months (around 07/26/2020) for Telemedicine mychart video - weight check, diet.  If you have any other questions or concerns, please feel free to call the office or send a message through MyChart. You may also schedule an earlier appointment if necessary.  Additionally, you may be receiving a survey about your experience at our office within a few days to 1 week by e-mail or mail. We value your feedback.  Saralyn Pilar, DO [facility], [facility]  --------------------------------------------------------------------------  Foods to avoid Any food that's high in carbs should be limited.  Here's a list of foods that need to be reduced or eliminated on a ketogenic diet:  sugary foods: soda, fruit juice, smoothies, cake, ice cream, candy, etc. grains or starches: wheat-based products, rice, pasta, cereal, etc. fruit: all fruit, except small portions of berries like strawberries beans or legumes: peas, kidney beans, lentils, chickpeas, etc. root vegetables and  tubers: potatoes, sweet potatoes, carrots, parsnips, etc. low fat or diet products: low fat mayonnaise, salad dressings, and condiments some condiments or sauces: barbecue sauce, honey mustard, teriyaki sauce, ketchup, etc. unhealthy fats: processed vegetable oils, mayonnaise, etc. alcohol: beer, wine, liquor, mixed drinks sugar-free diet foods: sugar-free candies, syrups, puddings, sweeteners, desserts, etc.  -------------------------------------  Foods to eat You should base the majority of your meals around these foods:  meat: red meat, steak, ham, sausage, bacon, chicken, and [place] fatty fish: salmon, trout, tuna, and mackerel eggs: pastured or omega-3 whole eggs butter and cream: grass-fed butter and heavy cream cheese: unprocessed cheeses like cheddar, goat, cream, blue, or mozzarella nuts and seeds: almonds, walnuts, flaxseeds, pumpkin seeds, chia seeds, etc. healthy oils: extra virgin olive oil, coconut oil, and avocado oil avocados: whole avocados or freshly made guacamole low carb veggies: green veggies, tomatoes, onions, peppers, etc. condiments: salt, pepper, herbs, and spices     Ketogenic Eating Plan, Adult A ketogenic eating plan is a diet that is very low in carbohydrates, moderately low in protein, and very high in fat. Your body normally gets energy from eating carbohydrates. If you limit carbohydrates in your diet, your body will start to use stored fat as an energy source. When fat is broken down for energy, it enters your blood as a substance called ketones. A ketogenic eating plan relies mostly on ketones for energy. This eating plan can cause rapid weight loss because the body burns fat for fuel. What are the benefits of a ketogenic eating plan? Epilepsy A ketogenic diet is sometimes used to treat epilepsy in children who have seizures that are not helped by  medicines. As a treatment for epilepsy in children, ketogenic eating plans have been well studied and used  for many years. This type of diet is usually started in the hospital and carefully monitored by a health care team. It is usually tried for several months to see if it will lessen seizures. Other conditions Ketogenic eating plans have also been studied to help treat other conditions, including:  Cancer.  Type 2 diabetes.  Alzheimer's disease.  Parkinson's disease.  Autism.  Multiple sclerosis. More studies need to be done to see how well this eating plan works for these and other conditions and what the long-term side effects might be. What are the side effects of a ketogenic eating plan? Side effects of a ketogenic diet may include:  Vitamin deficiencies.  Loss of body fluids (dehydration).  Bad breath.  Nausea.  Hunger.  Difficulty passing stool (constipation).  Problems with menstrual periods.  Inflammation of the pancreas.  Kidney stones or gall bladder stones.  Bone fractures.  High cholesterol. What are tips for following this plan? Reading food labels  Look for foods that have a low glycemic index (GI) label.  Read food ingredient lists to check for any hidden or added sugar.  Check food labels for the number of grams of carbohydrates and protein. This ensures that you are eating the right amounts of these nutrients. This is important. Cooking  Carefully measure or weigh foods.  Make desserts using ketogenic or low GI recipes.  Avoid cooking with sauces that contain added sugar, such as barbecue sauce or ketchup. Meal planning  Aim for a daily meal and snack time schedule that you can follow consistently.  Every meal will include high-fat items such as avocado, cream, butter, or mayonnaise. General information   Buy a gram scale to weigh foods. This is necessary to follow this diet correctly and accurately. Your dietitian will teach you how to use one for this diet.  Ask your health care provider what steps you can take to avoid side effects of  this eating plan, such as constipation or kidney stones.  Take vitamin and mineral supplements only as told by a health care provider or dietitian.  Work with your dietitian and health care provider to handle the key challenges of this diet and to help you stay on track. What foods should I eat?  Fruits Fresh pineapple, peaches, and apples. Other fresh and frozen fruits in small amounts. Vegetables Lettuce. Beets. Bok choy. Eggplant. Tomatoes. Turnips. Cucumbers. Peppers. Radishes. Cauliflower. Zucchini. Fennel. Swiss chard. Grains Whole wheat bread. Bran cereal. Brown rice. Whole wheat pasta. Low GI cereals. Meats and other proteins Meat, poultry, and fish. Eggs. Egg substitutes. Nuts, seeds, lentils, and split peas in small amounts. Tomasa Blase. Dairy Cheese in moderate amounts. Beverages Plain water. Sugar-free, caffeine-free beverages. Mineral water or club soda. Caffeine-free, carbohydrate-free herbal tea. Fats and oils Avocado. Cream. Sour cream. Cream cheese. Butter. Plant-based oils, such as olive, canola, and sunflower. Margarine. Mayonnaise. Sweets and desserts Any homemade sweets or desserts made using ketogenic diet recipes. The items listed above may not be a complete list of recommended foods and beverages. Contact a dietitian for more information. What foods should I avoid? Fruits Fruit juice. Fruits packed in syrups. Dried or candied fruits. Vegetables Corn. Potatoes. Peas. Grains All bread, dry cereals, and cooked cereals with added sugar. Baked goods. Crackers and pretzels. Meats and other proteins Meat, poultry, or fish prepared with flour or breading. Nut butters with added sugar. Beans. Dairy Milk.  Yogurt. Fats and oils Salad dressings with added sugar. Gravies. Beverages Sugar-sweetened teas, coffee drinks, or soft drinks. Juice. Sports drinks. Sweets and desserts All sweets and desserts, unless the dessert is homemade using ketogenic diet recipes. The items  listed above may not be a complete list of foods and beverages to avoid. Contact a dietitian for more information. Summary  A ketogenic eating plan is a diet that is very low in carbohydrates, moderately low in protein, and very high in fat.  Aim for a daily meal and snack time schedule that you can follow consistently.  Buy a gram scale to weigh foods. This is necessary to follow this diet correctly and accurately. Your dietitian will teach you how to use one for this diet.  Work closely with your health care provider and a dietitian while you are following a ketogenic eating plan. This information is not intended to replace advice given to you by your health care provider. Make sure you discuss any questions you have with your health care provider. Document Revised: 02/19/2018 Document Reviewed: 02/19/2018 Elsevier Patient Education  2020 [employer].

## 2020-04-25 NOTE — Progress Notes (Signed)
Subjective:    Patient ID: Amanda Pierce, female    DOB: 07-15-1956, 64 y.o.   MRN: [id number]  Amanda Pierce is a 64 y.o. female presenting on 04/25/2020 for Hypothyroidism   HPI   Here for Annual Physical and Lab review.  FOLLOW-UPHypothyroidism [facility], says they were supposed to schedule f/u with them and Thyroid Ultrasound Recent changes dose Synthroid 150 > to 125. Still admits energy is not normal. Denies temperature instability, edema, hair or skin changes  Pre-Diabetes / Obesity BMI >34 Prior history past 3-4 years, has had A1c 6.2 to 6.3 range. Due today for A1c Meds:not on medication Lifestyle: - Diet (balanced, admitsstill improving diet but not following regimen, she was mainly reducing portion size) - Exercise with walking around track few times a week unsure duration, no longer able to go to gym due to covid restrictions and now not quite comfortable returning - Limited time and stress due to her husband's health and she will be taking him to receive additional care in future. Denies hypoglycemia  Elevated BP - home readings 130s/70s - 130s / 80s goal to improve through exercise/lifestyle  Anemia IDA - followed by Heme/Onc upcoming labs 04/2020 Long history of prior anemia, reported in past with required iron treatment    Depression screen [facility] 2/9 04/25/2020 10/13/2019 04/05/2019  Decreased Interest 0 0 1  Down, Depressed, Hopeless 0 0 2  PHQ - 2 Score 0 0 3  Altered sleeping - - 3  Tired, decreased energy - - 3  Change in appetite - - 2  Feeling bad or failure about yourself  - - 2  Trouble concentrating - - 1  Moving slowly or fidgety/restless - - 2  Suicidal thoughts - - 0  PHQ-9 Score - - 16  Difficult doing work/chores - - Somewhat difficult    Social History   Tobacco Use  . Smoking status: Never Smoker  . Smokeless tobacco: Never Used  Vaping Use  . Vaping Use: Never used  Substance Use Topics  . Alcohol use:  Never  . Drug use: Never    Review of Systems Per HPI unless specifically indicated above     Objective:    BP 136/80 (BP Location: Left Arm, Cuff Size: Normal)   Pulse 65   Resp 16   Ht 5\' 8"  (1.727 m)   Wt 228 lb 9.6 oz (103.7 kg)   LMP  (LMP Unknown)   SpO2 100%   BMI 34.76 kg/m   Wt Readings from Last 3 Encounters:  04/25/20 228 lb 9.6 oz (103.7 kg)  12/28/19 227 lb (103 kg)  11/22/19 223 lb (101.2 kg)    Physical Exam Vitals and nursing note reviewed.  Constitutional:      General: She is not in acute distress.    Appearance: She is well-developed. She is obese. She is not diaphoretic.     Comments: Well-appearing, comfortable, cooperative  HENT:     Head: Normocephalic and atraumatic.  Eyes:     General:        Right eye: No discharge.        Left eye: No discharge.     Conjunctiva/sclera: Conjunctivae normal.  Cardiovascular:     Rate and Rhythm: Normal rate.  Pulmonary:     Effort: Pulmonary effort is normal.  Skin:    General: Skin is warm and dry.     Findings: No erythema or rash.  Neurological:     Mental Status: She is  alert and oriented to person, place, and time.  Psychiatric:        Behavior: Behavior normal.     Comments: Well groomed, good eye contact, normal speech and thoughts      Recent Labs    07/09/19 0931 04/25/20 1433  HGBA1C 6.3* 6.2*         Assessment & Plan:   Problem List Items Addressed This Visit    Pre-diabetes - Primary    Stable PreDM A1c 6.2 result, prior 6.2 to 6.3  Plan:  1. Not on any therapy currently  2. Encourage improved lifestyle - low carb, low sugar diet, reduce portion size, continue improving regular exercise  Likely check A1c only q 6-12 months now given stability unless wt gain significantly      Relevant Orders   POCT glycosylated hemoglobin (Hb A1C) (Completed)   Obesity (BMI 35.0-39.9 without comorbidity)    Weight up 1-2 lbs in 3 months. Overall up 5-6 lbs in 5-6 months. Encourage  lifestyle diet exercise regimen today Detailed counseling on keto diet and handouts given, given risk of DM with PreDM - emphasis on keto as option for weight loss. Reviewed exercise regimen with current cardio intermittent walking, advised schedule 3 x week 20-30 min cardio walking, keep timer, and increase gradually as tolerated up to 45-74min 3 x week then in future add gym or adjust exercise regimen.  Future we can refer to [facility] management clinic in [place] if limited success. 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 Discussed options again - ultimately we agree one more delay in dx and treat HTN - we will encourage improved lifestyle - low sodium diet, regular exercise and weight loss with keto diet plan as discussed today in detail, and if still elevated BP or suboptimal result can start HTN medication at next visit or in near future sooner if elevated readings at home >140/90           No orders of the defined types were placed in this encounter.     Follow up plan: Return in about 3 months (around 07/26/2020) for Telemedicine mychart video - weight check, diet.   Saralyn Pilar, DO [facility] 04/25/2020, 2:14 PM

## 2020-04-25 NOTE — Assessment & Plan Note (Signed)
Stable PreDM A1c 6.2 result, prior 6.2 to 6.3  Plan:  1. Not on any therapy currently  2. Encourage improved lifestyle - low carb, low sugar diet, reduce portion size, continue improving regular exercise  Likely check A1c only q 6-12 months now given stability unless wt gain significantly

## 2020-04-25 NOTE — Assessment & Plan Note (Signed)
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

## 2020-05-08 ENCOUNTER — Inpatient Hospital Stay: Payer: Medicare Other | Attending: Oncology

## 2020-05-08 ENCOUNTER — Other Ambulatory Visit: Payer: Self-pay

## 2020-05-08 DIAGNOSIS — K5900 Constipation, unspecified: Secondary | ICD-10-CM

## 2020-05-08 DIAGNOSIS — D509 Iron deficiency anemia, unspecified: Secondary | ICD-10-CM | POA: Diagnosis not present

## 2020-05-08 DIAGNOSIS — E039 Hypothyroidism, unspecified: Secondary | ICD-10-CM | POA: Insufficient documentation

## 2020-05-08 DIAGNOSIS — R1013 Epigastric pain: Secondary | ICD-10-CM

## 2020-05-08 LAB — CBC WITH DIFFERENTIAL/PLATELET
Abs Immature Granulocytes: 0 10*3/uL (ref 0.00–0.07)
Basophils Absolute: 0 10*3/uL (ref 0.0–0.1)
Basophils Relative: 0 %
Eosinophils Absolute: 0.1 10*3/uL (ref 0.0–0.5)
Eosinophils Relative: 1 %
HCT: 35.6 % — ABNORMAL LOW (ref 36.0–46.0)
Hemoglobin: 11.6 g/dL — ABNORMAL LOW (ref 12.0–15.0)
Immature Granulocytes: 0 %
Lymphocytes Relative: 50 %
Lymphs Abs: 2.2 10*3/uL (ref 0.7–4.0)
MCH: 26.1 pg (ref 26.0–34.0)
MCHC: 32.6 g/dL (ref 30.0–36.0)
MCV: 80.2 fL (ref 80.0–100.0)
Monocytes Absolute: 0.3 10*3/uL (ref 0.1–1.0)
Monocytes Relative: 6 %
Neutro Abs: 1.9 10*3/uL (ref 1.7–7.7)
Neutrophils Relative %: 43 %
Platelets: 254 10*3/uL (ref 150–400)
RBC: 4.44 MIL/uL (ref 3.87–5.11)
RDW: 16.7 % — ABNORMAL HIGH (ref 11.5–15.5)
WBC: 4.4 10*3/uL (ref 4.0–10.5)
nRBC: 0 % (ref 0.0–0.2)

## 2020-05-08 LAB — IRON AND TIBC
Iron: 73 ug/dL (ref 28–170)
Saturation Ratios: 20 % (ref 10.4–31.8)
TIBC: 363 ug/dL (ref 250–450)
UIBC: 290 ug/dL

## 2020-05-08 LAB — COMPREHENSIVE METABOLIC PANEL
ALT: 15 U/L (ref 0–44)
AST: 22 U/L (ref 15–41)
Albumin: 3.7 g/dL (ref 3.5–5.0)
Alkaline Phosphatase: 69 U/L (ref 38–126)
Anion gap: 8 (ref 5–15)
BUN: 13 mg/dL (ref 8–23)
CO2: 26 mmol/L (ref 22–32)
Calcium: 8.4 mg/dL — ABNORMAL LOW (ref 8.9–10.3)
Chloride: 106 mmol/L (ref 98–111)
Creatinine, Ser: 0.81 mg/dL (ref 0.44–1.00)
GFR calc Af Amer: >60 mL/min (ref >60)
GFR calc non Af Amer: >60 mL/min (ref >60)
Glucose, Bld: 111 mg/dL — ABNORMAL HIGH (ref 70–99)
Potassium: 3.8 mmol/L (ref 3.5–5.1)
Sodium: 140 mmol/L (ref 135–145)
Total Bilirubin: 0.7 mg/dL (ref 0.3–1.2)
Total Protein: 7.8 g/dL (ref 6.5–8.1)

## 2020-05-08 LAB — FERRITIN: Ferritin: 16 ng/mL (ref 11–307)

## 2020-05-09 DIAGNOSIS — S43409A Unspecified sprain of unspecified shoulder joint, initial encounter: Secondary | ICD-10-CM | POA: Insufficient documentation

## 2020-05-09 DIAGNOSIS — M7540 Impingement syndrome of unspecified shoulder: Secondary | ICD-10-CM | POA: Insufficient documentation

## 2020-05-10 ENCOUNTER — Inpatient Hospital Stay (HOSPITAL_BASED_OUTPATIENT_CLINIC_OR_DEPARTMENT_OTHER): Payer: Medicare Other | Admitting: Oncology

## 2020-05-10 ENCOUNTER — Encounter: Payer: Self-pay | Admitting: Oncology

## 2020-05-10 VITALS — BP 142/84 | HR 58 | Temp 97.2°F | Resp 18 | Wt 232.4 lb

## 2020-05-10 DIAGNOSIS — D509 Iron deficiency anemia, unspecified: Secondary | ICD-10-CM | POA: Diagnosis not present

## 2020-05-10 NOTE — Progress Notes (Signed)
Patient reports feeling fatigued.   

## 2020-05-10 NOTE — Progress Notes (Signed)
Hematology/Oncology follow up note [facility] Telephone:(336) [phone] Fax:(336) [phone]   Patient Care Team: Smitty CordsKaramalegos, Alexander J, DO as PCP - General (Family Medicine) Rickard PatienceYu, Anjalee Cope, MD as Consulting Physician (Oncology)  REFERRING PROVIDER: Saralyn PilarKaramalegos, Alexander * CHIEF COMPLAINTS/REASON FOR VISIT:  Evaluation of iron deficiency anemia  HISTORY OF PRESENTING ILLNESS:  Amanda LanSharon Tremont is a  64 y.o.  female with PMH listed below was seen in consultation at the request of Saralyn PilarKaramalegos, Alexander *   for evaluation of iron deficiency anemia.   Reviewed patient's recent labs  10/11/2019 labs revealed anemia with hemoglobin of 11.7, MCV 79.8.Marland Kitchen.   Reviewed patient's previous labs ordered by primary care physician's office, anemia is chronic onset , duration is since April 2020. No aggravating or improving factors.  Associated signs and symptoms: Patient reports fatigue.  Denies SOB with exertion.  Denies weight loss, easy bruising, hematochezia, hemoptysis, hematuria. Context:  History of iron deficiency: She took oral iron supplementation from April to September 2020 and stopped after iron panel improves. Rectal bleeding: Denies Menstrual bleeding/ Vaginal bleeding : Denies.  Postmenopausal. Hematemesis or hemoptysis : denies Blood in urine : denies   Last endoscopy: Patient has establish care with gastroenterology and has upcoming EGD/colonoscopy next week. Fatigue: Yes.  SOB: deneis  # 10/28/2019 upper endoscopy-ectopic gastric mucosa in the proximal esophagus.  Gastric mucosal atrophy.  Single gastric polyp.  GI felt that patient's abdominal pain likely secondary to gastric polyp which was biopsied.  Patient was referred to Dr. Meridee ScoreMansouraty for gastric polyp removal.  Gastric polyp showed hyperplastic gastric polyp, no metaplastic, adenomatous change or carcinoma. 10/28/2019 colonoscopy examination was normal.  No specimen was collected.   Patient currently  takes ferrous gluconate 324 mg once daily.  She previously was on ferrous sulfate and had severe constipation.  INTERVAL HISTORY Amanda LanSharon Roets is a 64 y.o. female who has above history reviewed by me today presents for follow up visit for management of iron deficiency anemia Problems and complaints are listed below: Patient reports feeling better.  Stomach discomfort has improved. She was seen by GI in March 2021. Fatigue level is better. She takes oral iron supplementation with vitamin C.  She told me that her thyroid medication has been adjusted for a few times.  She now takes Synthroid in the morning and wait for 4 hours before she takes iron tablets.  Review of Systems  Constitutional: Negative for appetite change, chills, fatigue and fever.  HENT:   Negative for hearing loss and voice change.   Eyes: Negative for eye problems.  Respiratory: Negative for chest tightness and cough.   Cardiovascular: Negative for chest pain.  Gastrointestinal: Negative for abdominal distention, abdominal pain, blood in stool and constipation.  Endocrine: Negative for hot flashes.  Genitourinary: Negative for difficulty urinating and frequency.   Musculoskeletal: Negative for arthralgias.  Skin: Negative for itching and rash.  Neurological: Negative for extremity weakness.  Hematological: Negative for adenopathy.  Psychiatric/Behavioral: Negative for confusion.    MEDICAL HISTORY:  Past Medical History:  Diagnosis Date  . Allergy   . Anemia   . Asthma   . Hypothyroidism   . Thyroid disease    hypothyroid    SURGICAL HISTORY: Past Surgical History:  Procedure Laterality Date  . BREAST MASS EXCISION Left 1974   benign  . COLONOSCOPY WITH PROPOFOL N/A 10/28/2019   Procedure: COLONOSCOPY WITH PROPOFOL;  Surgeon: Pasty Spillersahiliani, Varnita B, MD;  Location: [facility] ENDOSCOPY;  Service: Endoscopy;  Laterality: N/A;  . ENDOSCOPIC MUCOSAL  RESECTION N/A 11/22/2019   Procedure: ENDOSCOPIC MUCOSAL RESECTION;   Surgeon: Meridee Score Netty Starring., MD;  Location: [facility] ENDOSCOPY;  Service: Gastroenterology;  Laterality: N/A;  . ESOPHAGOGASTRODUODENOSCOPY (EGD) WITH PROPOFOL N/A 10/28/2019   Procedure: ESOPHAGOGASTRODUODENOSCOPY (EGD) WITH PROPOFOL;  Surgeon: Pasty Spillers, MD;  Location: [facility] ENDOSCOPY;  Service: Endoscopy;  Laterality: N/A;  . ESOPHAGOGASTRODUODENOSCOPY (EGD) WITH PROPOFOL N/A 11/22/2019   Procedure: ESOPHAGOGASTRODUODENOSCOPY (EGD) WITH PROPOFOL;  Surgeon: Meridee Score Netty Starring., MD;  Location: [facility] ENDOSCOPY;  Service: Gastroenterology;  Laterality: N/A;  . HEMOSTASIS CLIP PLACEMENT  11/22/2019   Procedure: HEMOSTASIS CLIP PLACEMENT;  Surgeon: Lemar Lofty., MD;  Location: [facility] ENDOSCOPY;  Service: Gastroenterology;;  . HEMOSTASIS CONTROL  11/22/2019   Procedure: HEMOSTASIS CONTROL;  Surgeon: Lemar Lofty., MD;  Location: [facility] ENDOSCOPY;  Service: Gastroenterology;;  epi   . nodule removed  1982   from thyroid -benign  . PARATHYROIDECTOMY  1982   patient denies this dx  . SUBMUCOSAL LIFTING INJECTION  11/22/2019   Procedure: SUBMUCOSAL LIFTING INJECTION;  Surgeon: Meridee Score Netty Starring., MD;  Location: [facility] ENDOSCOPY;  Service: Gastroenterology;;  . TUBAL LIGATION    . WISDOM TOOTH EXTRACTION      SOCIAL HISTORY: Social History   Socioeconomic History  . Marital status: Married    Spouse name: Not on file  . Number of children: Not on file  . Years of education: Not on file  . Highest education level: Not on file  Occupational History  . Occupation: retired  Tobacco Use  . Smoking status: Never Smoker  . Smokeless tobacco: Never Used  Vaping Use  . Vaping Use: Never used  Substance and Sexual Activity  . Alcohol use: Never  . Drug use: Never  . Sexual activity: Not on file  Other Topics Concern  . Not on file  Social History Narrative  . Not on file   Social Determinants of Health   Financial Resource Strain:   . Difficulty of Paying Living  Expenses:   Food Insecurity:   . Worried About Programme researcher, broadcasting/film/video in the Last Year:   . Barista in the Last Year:   Transportation Needs:   . Freight forwarder (Medical):   Marland Kitchen Lack of Transportation (Non-Medical):   Physical Activity:   . Days of Exercise per Week:   . Minutes of Exercise per Session:   Stress:   . Feeling of Stress :   Social Connections:   . Frequency of Communication with Friends and Family:   . Frequency of Social Gatherings with Friends and Family:   . Attends Religious Services:   . Active Member of Clubs or Organizations:   . Attends Banker Meetings:   Marland Kitchen Marital Status:   Intimate Partner Violence:   . Fear of Current or Ex-Partner:   . Emotionally Abused:   Marland Kitchen Physically Abused:   . Sexually Abused:     FAMILY HISTORY: Family History  Problem Relation Age of Onset  . Cancer Mother        Breast, Paget's removed nipple 30, breast 2001  . Colon polyps Mother   . Paget's disease of bone Mother   . Hypertension Mother   . Hyperlipidemia Mother   . Heart disease Father   . Heart attack Father   . Heart disease Maternal Grandfather   . Stroke Paternal Grandmother   . Colon cancer Neg Hx   . Esophageal cancer Neg Hx   . Inflammatory bowel disease Neg Hx   .  Liver disease Neg Hx   . Pancreatic cancer Neg Hx   . Rectal cancer Neg Hx   . Stomach cancer Neg Hx     ALLERGIES:  is allergic to aspirin, contrast media [iodinated diagnostic agents], ibuprofen, latex, penicillins, rubbing alcohol [alcohol], sulfur, and adhesive [tape].  MEDICATIONS:  Current Outpatient Medications  Medication Sig Dispense Refill  . fexofenadine (ALLEGRA) 180 MG tablet Take 180 mg by mouth daily as needed for allergies or rhinitis.    Marland Kitchen SYNTHROID 125 MCG tablet Take by mouth as directed.    Marland Kitchen ascorbic acid ([employer]S VITAMIN C) 250 MG tablet TAKE 2 TABLETS (500 MG TOTAL) BY MOUTH DAILY. (Patient not taking: Reported on 04/25/2020) 100 tablet 1  .  FERREX 150 150 MG capsule TAKE 1 CAPSULE BY MOUTH EVERY DAY (Patient not taking: Reported on 04/25/2020) 30 capsule 2  . linaclotide (LINZESS) 145 MCG CAPS capsule Take 1 capsule (145 mcg total) by mouth daily before breakfast. (Patient not taking: Reported on 04/25/2020) 30 capsule 3   No current facility-administered medications for this visit.     PHYSICAL EXAMINATION: ECOG PERFORMANCE STATUS: 0 - Asymptomatic   Vitals:   05/10/20 1349  BP: (!) 142/84  Pulse: (!) 58  Resp: 18  Temp: (!) 97.2 F (36.2 C)   Filed Weights   05/10/20 1349  Weight: 232 lb 6.4 oz (105.4 kg)    Physical Exam Constitutional:      General: She is not in acute distress. HENT:     Head: Normocephalic and atraumatic.  Eyes:     General: No scleral icterus.    Pupils: Pupils are equal, round, and reactive to light.  Cardiovascular:     Rate and Rhythm: Normal rate and regular rhythm.     Heart sounds: Normal heart sounds.  Pulmonary:     Effort: Pulmonary effort is normal. No respiratory distress.     Breath sounds: No wheezing.  Abdominal:     General: Bowel sounds are normal. There is no distension.     Palpations: Abdomen is soft. There is no mass.     Tenderness: There is no abdominal tenderness.  Musculoskeletal:        General: No deformity. Normal range of motion.     Cervical back: Normal range of motion and neck supple.  Skin:    General: Skin is warm and dry.     Findings: No erythema or rash.  Neurological:     Mental Status: She is alert and oriented to person, place, and time. Mental status is at baseline.     Cranial Nerves: No cranial nerve deficit.     Coordination: Coordination normal.  Psychiatric:        Mood and Affect: Mood normal.        Behavior: Behavior normal.        Thought Content: Thought content normal.       CMP Latest Ref Rng & Units 05/08/2020  Glucose 70 - 99 mg/dL 161(W)  BUN 8 - 23 mg/dL 13  Creatinine 9.60 - 4.54 mg/dL 0.98  Sodium 119 - 147  mmol/L 140  Potassium 3.5 - 5.1 mmol/L 3.8  Chloride 98 - 111 mmol/L 106  CO2 22 - 32 mmol/L 26  Calcium 8.9 - 10.3 mg/dL 8.2(N)  Total Protein 6.5 - 8.1 g/dL 7.8  Total Bilirubin 0.3 - 1.2 mg/dL 0.7  Alkaline Phos 38 - 126 U/L 69  AST 15 - 41 U/L 22  ALT 0 - 44 U/L  15   CBC Latest Ref Rng & Units 05/08/2020  WBC 4.0 - 10.5 K/uL 4.4  Hemoglobin 12.0 - 15.0 g/dL 11.6(L)  Hematocrit 36 - 46 % 35.6(L)  Platelets 150 - 400 K/uL 254     LABORATORY DATA:  I have reviewed the data as listed Lab Results  Component Value Date   WBC 4.4 05/08/2020   HGB 11.6 (L) 05/08/2020   HCT 35.6 (L) 05/08/2020   MCV 80.2 05/08/2020   PLT 254 05/08/2020   Recent Labs    11/10/19 1225 05/08/20 1409  NA 138 140  K 4.1 3.8  CL 106 106  CO2 26 26  GLUCOSE 108* 111*  BUN 12 13  CREATININE 0.82 0.81  CALCIUM 8.9 8.4*  GFRNONAA  --  >60  GFRAA  --  >60  PROT  --  7.8  ALBUMIN  --  3.7  AST  --  22  ALT  --  15  ALKPHOS  --  69  BILITOT  --  0.7   Iron/TIBC/Ferritin/ %Sat    Component Value Date/Time   IRON 73 05/08/2020 1409   TIBC 363 05/08/2020 1409   FERRITIN 16 05/08/2020 1409   IRONPCTSAT 20 05/08/2020 1409   IRONPCTSAT 9 (L) 10/11/2019 0940     No results found.    ASSESSMENT & PLAN:  1. Iron deficiency anemia, unspecified iron deficiency anemia type    Iron deficiency anemia, Patient has been taking oral iron gluconate with vitamin C supplements Labs are reviewed and discussed with patient. Ferritin has probably improved to 16, iron saturation is at 20. Recommend patient to continue Ferrex 150 mg daily.  Refills were sent. Calcium is mildly low, patient is postmenopausal.  Recommend patient to take over-the-counter calcium supplementation. Epigastric discomfort, improved.  Continue follows up with gastroenterology if needed.  Repeat blood work in 6 months. Orders Placed This Encounter  Procedures  . CBC with Differential/Platelet    Standing Status:   Future     Standing Expiration Date:   05/10/2021  . Comprehensive metabolic panel    Standing Status:   Future    Standing Expiration Date:   05/10/2021  . Ferritin    Standing Status:   Future    Standing Expiration Date:   05/10/2021  . Iron and TIBC    Standing Status:   Future    Standing Expiration Date:   05/10/2021    All questions were answered. The patient knows to call the clinic with any problems questions or concerns.  Rickard Patience, MD, PhD Hematology Oncology [facility] Pager- [phone] 05/10/2020

## 2020-06-27 ENCOUNTER — Ambulatory Visit: Payer: Medicare Other | Admitting: Podiatry

## 2020-07-04 ENCOUNTER — Ambulatory Visit: Payer: Medicare Other | Admitting: Podiatry

## 2020-07-04 ENCOUNTER — Other Ambulatory Visit: Payer: Self-pay

## 2020-07-04 DIAGNOSIS — R202 Paresthesia of skin: Secondary | ICD-10-CM

## 2020-07-04 NOTE — Progress Notes (Signed)
   HPI: 64 y.o. female presenting today as a new patient for evaluation of numbness to the bilateral fifth toes.  Patient states that she notices the numbness more so when she is wearing shoes.  She does not have any associated pain only loss of sensation.  The numbness extends up to the midfoot only.  She wants to make sure that she does not have any underlying conditions or problems.  She presents for further treatment and evaluation  Past Medical History:  Diagnosis Date  . Allergy   . Anemia   . Asthma   . Hypothyroidism   . Thyroid disease    hypothyroid     Physical Exam: General: The patient is alert and oriented x3 in no acute distress.  Dermatology: Skin is warm, dry and supple bilateral lower extremities. Negative for open lesions or macerations.  Vascular: Palpable pedal pulses bilaterally. No edema or erythema noted. Capillary refill within normal limits.  Neurological: Epicritic and protective threshold grossly intact bilaterally.  There is some paresthesia noted with loss of sensation to the fifth digits bilateral  Musculoskeletal Exam: Range of motion within normal limits to all pedal and ankle joints bilateral. Muscle strength 5/5 in all groups bilateral.  Clinical evidence of a mild bunion deformity noted to the bilateral feet, asymptomatic.  Assessment: 1.  Paresthesia with numbness fifth digits bilateral secondary to closed toe shoes   Plan of Care:  1. Patient evaluated 2.  Today explained to the patient this is most likely associated to her close toed shoes.  She does not have the symptoms as much when she is barefoot. 3.  Reassured the patient that there is nothing alarming to worry about. 4.  Recommend wide fitting shoes that do not aggravate the fifth toes bilaterally 5.  Return to clinic as needed      Felecia Shelling, DPM [facility]  Dr. Felecia Shelling, DPM    [address] [place], [place]  [address]                Office [phone]  Fax [phone]

## 2020-07-09 IMAGING — DX RIGHT SHOULDER - 2+ VIEW
4 series · 4 of 4 positions shown · non-contrast
Comparison: None.

CLINICAL DATA: Right shoulder pain for 2 months.  No known injury.

EXAM:
RIGHT SHOULDER - 2+ VIEW

[shoulder ap]
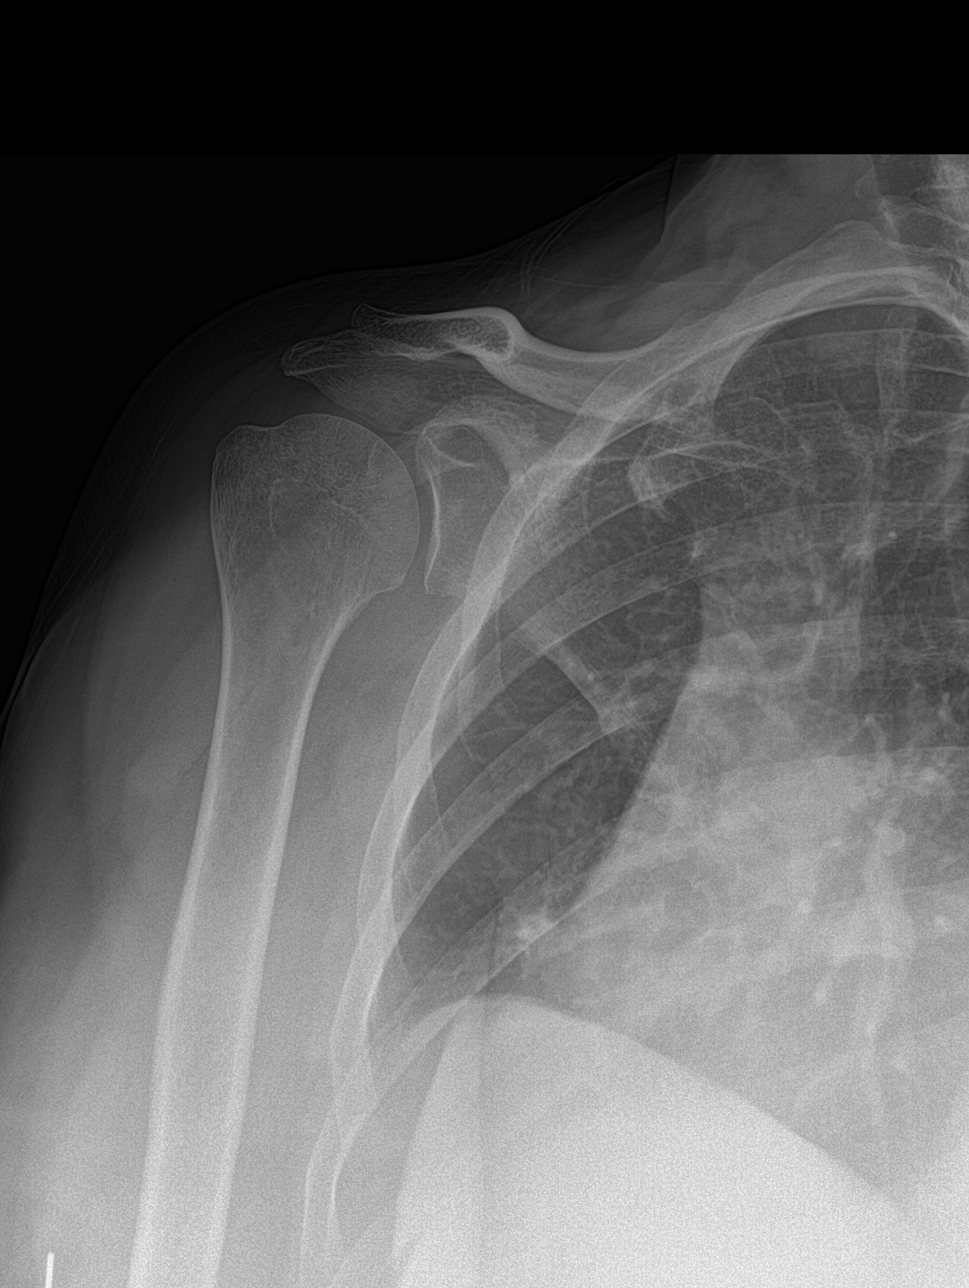

[shoulder y-view (1 of 2)]
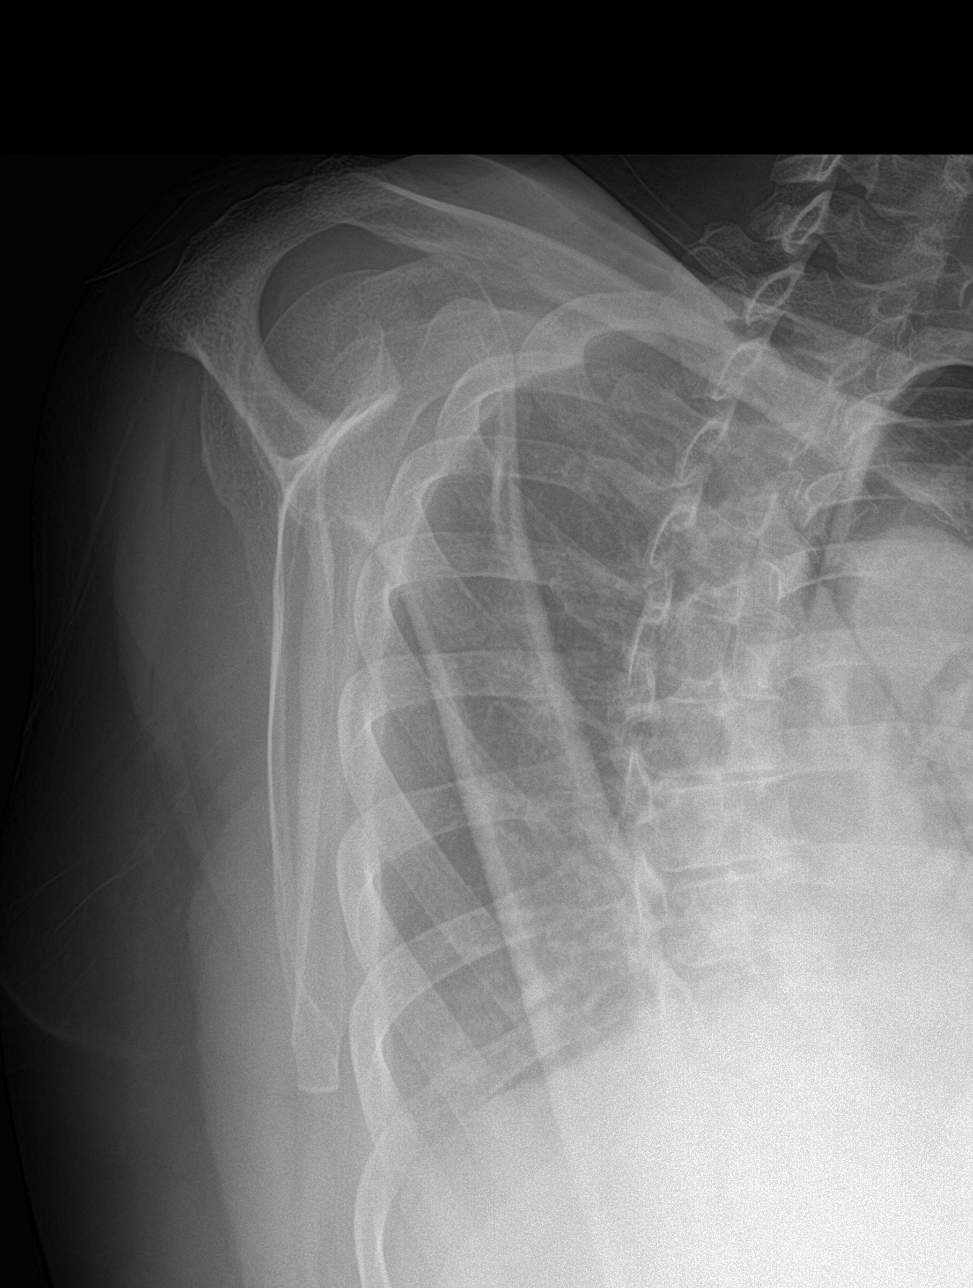

[shoulder axial]
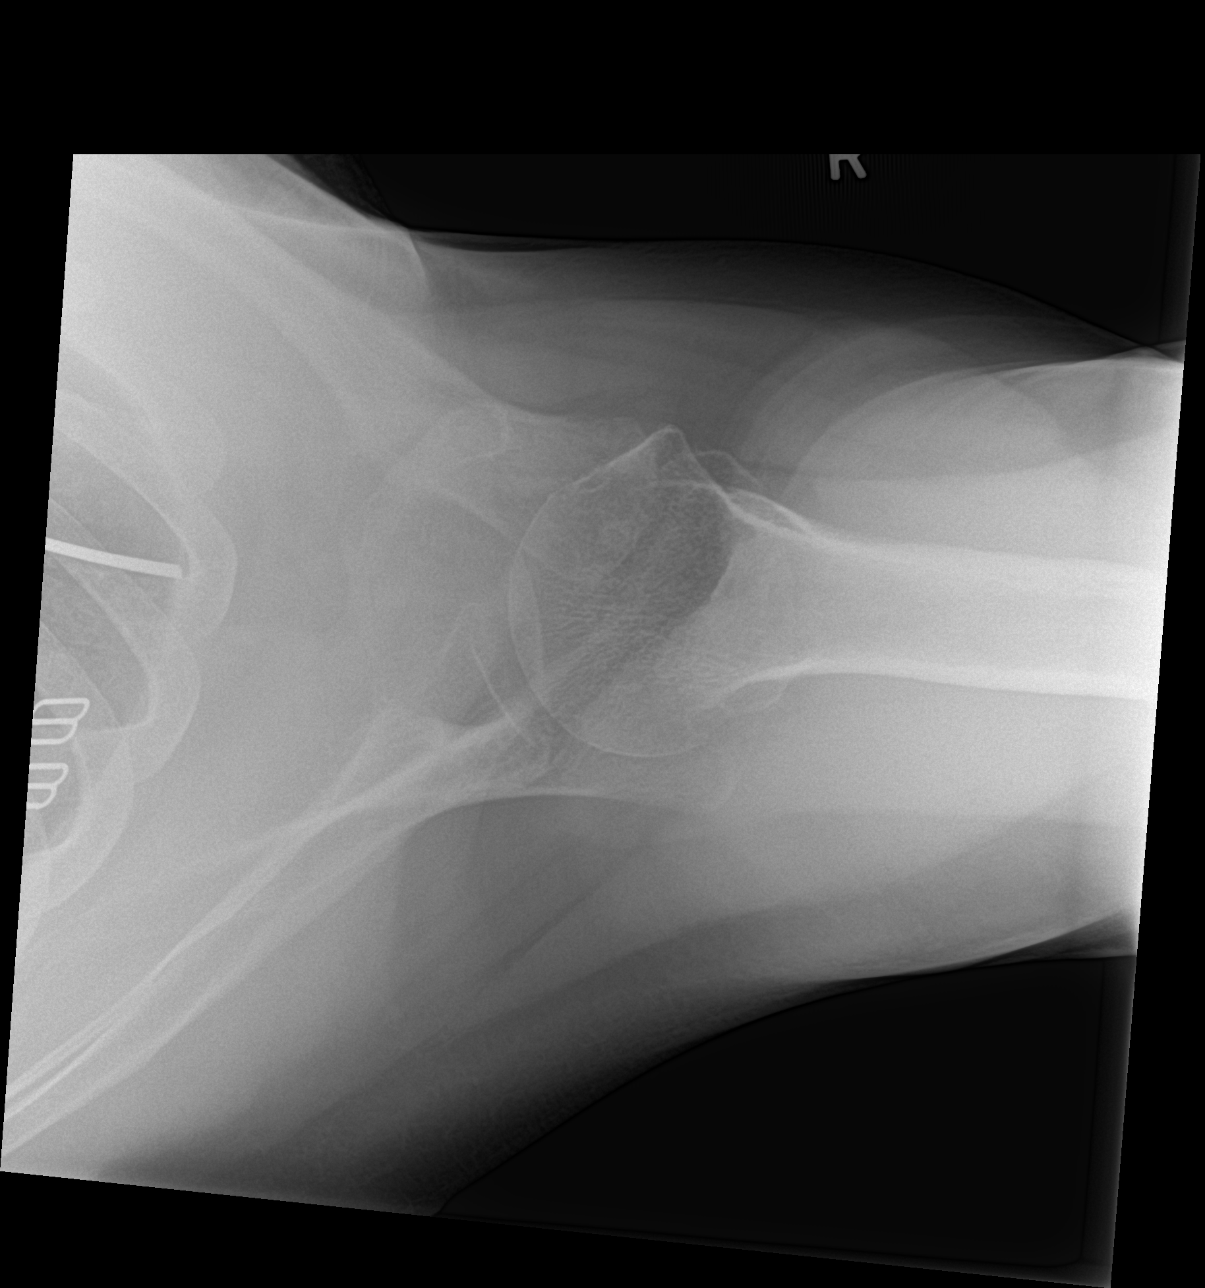

[shoulder y-view (2 of 2)]
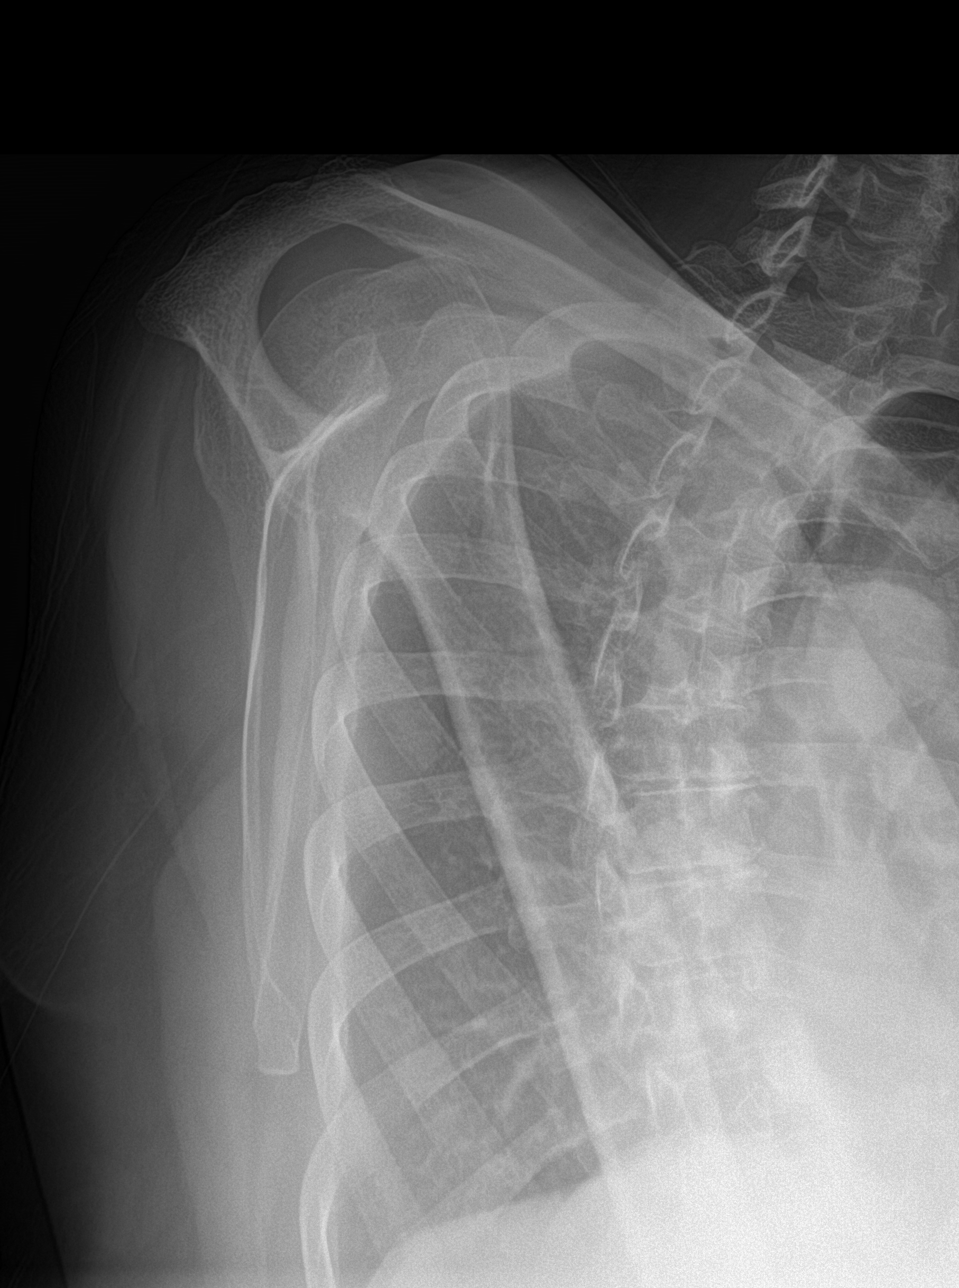

[4 of 4 positions shown; findings below may reference images not displayed]

FINDINGS: There is no evidence of fracture or dislocation. There is no
evidence of arthropathy or other focal bone abnormality. Soft
tissues are unremarkable.
IMPRESSION: Negative exam.

## 2020-07-24 ENCOUNTER — Telehealth: Payer: Medicare Other | Admitting: Family Medicine

## 2020-11-06 ENCOUNTER — Inpatient Hospital Stay: Payer: Medicare Other | Attending: Oncology

## 2020-11-06 DIAGNOSIS — D509 Iron deficiency anemia, unspecified: Secondary | ICD-10-CM | POA: Insufficient documentation

## 2020-11-06 LAB — COMPREHENSIVE METABOLIC PANEL
ALT: 13 U/L (ref 0–44)
AST: 20 U/L (ref 15–41)
Albumin: 3.9 g/dL (ref 3.5–5.0)
Alkaline Phosphatase: 60 U/L (ref 38–126)
Anion gap: 8 (ref 5–15)
BUN: 12 mg/dL (ref 8–23)
CO2: 26 mmol/L (ref 22–32)
Calcium: 8.7 mg/dL — ABNORMAL LOW (ref 8.9–10.3)
Chloride: 104 mmol/L (ref 98–111)
Creatinine, Ser: 0.87 mg/dL (ref 0.44–1.00)
GFR, Estimated: >60 mL/min (ref >60)
Glucose, Bld: 139 mg/dL — ABNORMAL HIGH (ref 70–99)
Potassium: 3.7 mmol/L (ref 3.5–5.1)
Sodium: 138 mmol/L (ref 135–145)
Total Bilirubin: 0.4 mg/dL (ref 0.3–1.2)
Total Protein: 8 g/dL (ref 6.5–8.1)

## 2020-11-06 LAB — CBC WITH DIFFERENTIAL/PLATELET
Abs Immature Granulocytes: 0.04 10*3/uL (ref 0.00–0.07)
Basophils Absolute: 0 10*3/uL (ref 0.0–0.1)
Basophils Relative: 0 %
Eosinophils Absolute: 0 10*3/uL (ref 0.0–0.5)
Eosinophils Relative: 1 %
HCT: 38.3 % (ref 36.0–46.0)
Hemoglobin: 12.2 g/dL (ref 12.0–15.0)
Immature Granulocytes: 1 %
Lymphocytes Relative: 44 %
Lymphs Abs: 2.3 10*3/uL (ref 0.7–4.0)
MCH: 26.4 pg (ref 26.0–34.0)
MCHC: 31.9 g/dL (ref 30.0–36.0)
MCV: 82.9 fL (ref 80.0–100.0)
Monocytes Absolute: 0.3 10*3/uL (ref 0.1–1.0)
Monocytes Relative: 6 %
Neutro Abs: 2.5 10*3/uL (ref 1.7–7.7)
Neutrophils Relative %: 48 %
Platelets: 267 10*3/uL (ref 150–400)
RBC: 4.62 MIL/uL (ref 3.87–5.11)
RDW: 15 % (ref 11.5–15.5)
WBC: 5.2 10*3/uL (ref 4.0–10.5)
nRBC: 0 % (ref 0.0–0.2)

## 2020-11-06 LAB — IRON AND TIBC
Iron: 65 ug/dL (ref 28–170)
Saturation Ratios: 17 % (ref 10.4–31.8)
TIBC: 379 ug/dL (ref 250–450)
UIBC: 314 ug/dL

## 2020-11-06 LAB — FERRITIN: Ferritin: 23 ng/mL (ref 11–307)

## 2020-11-08 ENCOUNTER — Encounter: Payer: Self-pay | Admitting: Oncology

## 2020-11-08 ENCOUNTER — Inpatient Hospital Stay (HOSPITAL_BASED_OUTPATIENT_CLINIC_OR_DEPARTMENT_OTHER): Payer: Medicare Other | Admitting: Oncology

## 2020-11-08 ENCOUNTER — Other Ambulatory Visit: Payer: Self-pay

## 2020-11-08 DIAGNOSIS — D509 Iron deficiency anemia, unspecified: Secondary | ICD-10-CM

## 2020-11-08 NOTE — Progress Notes (Signed)
HEMATOLOGY-ONCOLOGY TeleHEALTH VISIT PROGRESS NOTE  I connected with Amanda Pierce on @ENCDATE  @ at  3:15 PM EST by video enabled telemedicine visit and verified that I am speaking with the correct person using two identifiers. I discussed the limitations, risks, security and privacy concerns of performing an evaluation and management service by telemedicine and the availability of in-person appointments. The patient expressed understanding and agreed to proceed.   Other persons participating in the visit and their role in the encounter:  None  Patient's location: Home  Provider's location: office Chief Complaint: Iron deficiency anemia   INTERVAL HISTORY Amanda Pierce is a 65 y.o. female who has above history reviewed by me today presents for follow up visit for management of iron deficiency anemia Problems and complaints are listed below:  I attempted to connect the patient for visual enabled telehealth visit.  Due to the technical difficulties with video,  Patient was transitioned to audio only visit. Patient has no new complaints.  Reports doing well.  Review of Systems  Constitutional: Negative for appetite change, chills, fatigue and fever.  HENT:   Negative for hearing loss and voice change.   Eyes: Negative for eye problems.  Respiratory: Negative for chest tightness and cough.   Cardiovascular: Negative for chest pain.  Gastrointestinal: Negative for abdominal distention, abdominal pain and blood in stool.  Endocrine: Negative for hot flashes.  Genitourinary: Negative for difficulty urinating and frequency.   Musculoskeletal: Negative for arthralgias.  Skin: Negative for itching and rash.  Neurological: Negative for extremity weakness.  Hematological: Negative for adenopathy.  Psychiatric/Behavioral: Negative for confusion.    Past Medical History:  Diagnosis Date  . Allergy   . Anemia   . Asthma   . Hypothyroidism   . Thyroid disease    hypothyroid   Past Surgical  History:  Procedure Laterality Date  . BREAST MASS EXCISION Left 1974   benign  . COLONOSCOPY WITH PROPOFOL N/A 10/28/2019   Procedure: COLONOSCOPY WITH PROPOFOL;  Surgeon: 10/30/2019, MD;  Location: [facility] ENDOSCOPY;  Service: Endoscopy;  Laterality: N/A;  . ENDOSCOPIC MUCOSAL RESECTION N/A 11/22/2019   Procedure: ENDOSCOPIC MUCOSAL RESECTION;  Surgeon: 11/24/2019 Meridee Score., MD;  Location: [facility] ENDOSCOPY;  Service: Gastroenterology;  Laterality: N/A;  . ESOPHAGOGASTRODUODENOSCOPY (EGD) WITH PROPOFOL N/A 10/28/2019   Procedure: ESOPHAGOGASTRODUODENOSCOPY (EGD) WITH PROPOFOL;  Surgeon: 10/30/2019, MD;  Location: [facility] ENDOSCOPY;  Service: Endoscopy;  Laterality: N/A;  . ESOPHAGOGASTRODUODENOSCOPY (EGD) WITH PROPOFOL N/A 11/22/2019   Procedure: ESOPHAGOGASTRODUODENOSCOPY (EGD) WITH PROPOFOL;  Surgeon: 11/24/2019 Meridee Score., MD;  Location: [facility] ENDOSCOPY;  Service: Gastroenterology;  Laterality: N/A;  . HEMOSTASIS CLIP PLACEMENT  11/22/2019   Procedure: HEMOSTASIS CLIP PLACEMENT;  Surgeon: 11/24/2019., MD;  Location: [facility] ENDOSCOPY;  Service: Gastroenterology;;  . HEMOSTASIS CONTROL  11/22/2019   Procedure: HEMOSTASIS CONTROL;  Surgeon: 11/24/2019., MD;  Location: [facility] ENDOSCOPY;  Service: Gastroenterology;;  epi   . nodule removed  1982   from thyroid -benign  . PARATHYROIDECTOMY  1982   patient denies this dx  . SUBMUCOSAL LIFTING INJECTION  11/22/2019   Procedure: SUBMUCOSAL LIFTING INJECTION;  Surgeon: 11/24/2019 Meridee Score., MD;  Location: [facility] ENDOSCOPY;  Service: Gastroenterology;;  . TUBAL LIGATION    . WISDOM TOOTH EXTRACTION      Family History  Problem Relation Age of Onset  . Cancer Mother        Breast, Paget's removed nipple 47, breast 2001  . Colon polyps Mother   . Paget's disease of  bone Mother   . Hypertension Mother   . Hyperlipidemia Mother   . Heart disease Father   . Heart attack Father   . Heart disease Maternal Grandfather   .  Stroke Paternal Grandmother   . Colon cancer Neg Hx   . Esophageal cancer Neg Hx   . Inflammatory bowel disease Neg Hx   . Liver disease Neg Hx   . Pancreatic cancer Neg Hx   . Rectal cancer Neg Hx   . Stomach cancer Neg Hx     Social History   Socioeconomic History  . Marital status: Married    Spouse name: Not on file  . Number of children: Not on file  . Years of education: Not on file  . Highest education level: Not on file  Occupational History  . Occupation: retired  Tobacco Use  . Smoking status: Never Smoker  . Smokeless tobacco: Never Used  Vaping Use  . Vaping Use: Never used  Substance and Sexual Activity  . Alcohol use: Never  . Drug use: Never  . Sexual activity: Not on file  Other Topics Concern  . Not on file  Social History Narrative  . Not on file   Social Determinants of Health   Financial Resource Strain: Not on file  Food Insecurity: Not on file  Transportation Needs: Not on file  Physical Activity: Not on file  Stress: Not on file  Social Connections: Not on file  Intimate Partner Violence: Not on file    Current Outpatient Medications on File Prior to Visit  Medication Sig Dispense Refill  . ascorbic acid ([employer]S VITAMIN C) 250 MG tablet TAKE 2 TABLETS (500 MG TOTAL) BY MOUTH DAILY. 100 tablet 1  . fexofenadine (ALLEGRA) 180 MG tablet Take 180 mg by mouth daily as needed for allergies or rhinitis.    Marland Kitchen montelukast (SINGULAIR) 10 MG tablet Take 10 mg by mouth daily as needed.    Marland Kitchen SYNTHROID 125 MCG tablet Take by mouth as directed.    Marland Kitchen FERREX 150 150 MG capsule TAKE 1 CAPSULE BY MOUTH EVERY DAY (Patient not taking: Reported on 11/08/2020) 30 capsule 2  . linaclotide (LINZESS) 145 MCG CAPS capsule Take 1 capsule (145 mcg total) by mouth daily before breakfast. (Patient not taking: No sig reported) 30 capsule 3   No current facility-administered medications on file prior to visit.    Allergies  Allergen Reactions  . Aspirin Anaphylaxis  .  Contrast Media [Iodinated Diagnostic Agents] Nausea And Vomiting and Palpitations  . Ibuprofen Anaphylaxis  . Latex Rash    Any latex adhesive, rubber compounds:  examples: ekg leads,etc.  . Penicillins Anaphylaxis    Did it involve swelling of the face/tongue/throat, SOB, or low BP? Yes Did it involve sudden or severe rash/hives, skin peeling, or any reaction on the inside of your mouth or nose? No Did you need to seek medical attention at a hospital or doctor's office? Yes When did it last happen?40 years ago/ Patient was in the hospital when happend If all above answers are "NO", may proceed with cephalosporin use.  . Rubbing Alcohol [Alcohol] Other (See Comments)    Severe skin warmth  . Sulfur Hives and Rash  . Adhesive [Tape]        Observations/Objective: Today's Vitals   11/08/20 1503  PainSc: 7    There is no height or weight on file to calculate BMI.  Physical Exam  CBC    Component Value Date/Time   WBC 5.2  11/06/2020 1546   RBC 4.62 11/06/2020 1546   HGB 12.2 11/06/2020 1546   HCT 38.3 11/06/2020 1546   PLT 267 11/06/2020 1546   MCV 82.9 11/06/2020 1546   MCH 26.4 11/06/2020 1546   MCHC 31.9 11/06/2020 1546   RDW 15.0 11/06/2020 1546   LYMPHSABS 2.3 11/06/2020 1546   MONOABS 0.3 11/06/2020 1546   EOSABS 0.0 11/06/2020 1546   BASOSABS 0.0 11/06/2020 1546    CMP     Component Value Date/Time   NA 138 11/06/2020 1546   NA 140 09/29/2014 0000   K 3.7 11/06/2020 1546   CL 104 11/06/2020 1546   CO2 26 11/06/2020 1546   GLUCOSE 139 (H) 11/06/2020 1546   BUN 12 11/06/2020 1546   BUN 14 09/29/2014 0000   CREATININE 0.87 11/06/2020 1546   CREATININE 0.81 02/23/2019 0829   CALCIUM 8.7 (L) 11/06/2020 1546   PROT 8.0 11/06/2020 1546   ALBUMIN 3.9 11/06/2020 1546   AST 20 11/06/2020 1546   ALT 13 11/06/2020 1546   ALKPHOS 60 11/06/2020 1546   BILITOT 0.4 11/06/2020 1546   GFRNONAA >60 11/06/2020 1546   GFRNONAA 77 02/23/2019 0829   GFRAA >60  05/08/2020 1409   GFRAA 89 02/23/2019 0829     Assessment and Plan: 1. Iron deficiency anemia, unspecified iron deficiency anemia type     Labs are reviewed and discussed with patient Hemoglobin has improved to 12.2.  Iron saturation 17, ferritin 23.  All stable.  No need for IV Venofer treatments.  Follow Up Instructions: 1 year.   I discussed the assessment and treatment plan with the patient. The patient was provided an opportunity to ask questions and all were answered. The patient agreed with the plan and demonstrated an understanding of the instructions.  The patient was advised to call back or seek an in-person evaluation if the symptoms worsen or if the condition fails to improve as anticipated.    Rickard Patience, MD 11/08/2020 4:50 PM

## 2020-11-08 NOTE — Progress Notes (Signed)
Pt contacted for mychart visit. Pt complains of pain to bottom of left breast/ bottom of ribcage. Pt had covid booster on 12/29 and has been sick on and off ever since.

## 2021-04-03 ENCOUNTER — Ambulatory Visit (INDEPENDENT_AMBULATORY_CARE_PROVIDER_SITE_OTHER): Payer: Medicare Other

## 2021-04-03 VITALS — Ht 68.5 in | Wt 232.0 lb

## 2021-04-03 DIAGNOSIS — Z0000 Encounter for general adult medical examination without abnormal findings: Secondary | ICD-10-CM

## 2021-04-03 NOTE — Progress Notes (Signed)
I connected with Amanda Pierce today by telephone and verified that I am speaking with the correct person using two identifiers. Location patient: home Location provider: work Persons participating in the virtual visit: Beena, Catano LPN.   I discussed the limitations, risks, security and privacy concerns of performing an evaluation and management service by telephone and the availability of in person appointments. I also discussed with the patient that there may be a patient responsible charge related to this service. The patient expressed understanding and verbally consented to this telephonic visit.    Interactive audio and video telecommunications were attempted between this provider and patient, however failed, due to patient having technical difficulties OR patient did not have access to video capability.  We continued and completed visit with audio only.     Vital signs may be patient reported or missing.  Subjective:   Amanda Pierce is a 65 y.o. female who presents for an Initial Medicare Annual Wellness Visit.  Review of Systems     Cardiac Risk Factors include: advanced age (>61men, >43 women);sedentary lifestyle;obesity (BMI >30kg/m2)     Objective:    Today's Vitals   04/03/21 1136  Weight: 232 lb (105.2 kg)  Height: 5' 8.5" (1.74 m)   Body mass index is 34.76 kg/m.  Advanced Directives 04/03/2021 11/08/2020 12/28/2019 11/22/2019 10/19/2019  Does Patient Have a Medical Advance Directive? No No No No No  Would patient like information on creating a medical advance directive? - - - No - Guardian declined -    Current Medications (verified) Outpatient Encounter Medications as of 04/03/2021  Medication Sig  . fexofenadine (ALLEGRA) 180 MG tablet Take 180 mg by mouth daily as needed for allergies or rhinitis.  Marland Kitchen montelukast (SINGULAIR) 10 MG tablet Take 10 mg by mouth daily as needed.  Marland Kitchen SYNTHROID 125 MCG tablet Take by mouth as directed.  Marland Kitchen ascorbic acid ([employer]  VITAMIN C) 250 MG tablet TAKE 2 TABLETS (500 MG TOTAL) BY MOUTH DAILY. (Patient not taking: Reported on 04/03/2021)  . FERREX 150 150 MG capsule TAKE 1 CAPSULE BY MOUTH EVERY DAY (Patient not taking: No sig reported)  . linaclotide (LINZESS) 145 MCG CAPS capsule Take 1 capsule (145 mcg total) by mouth daily before breakfast. (Patient not taking: No sig reported)   No facility-administered encounter medications on file as of 04/03/2021.    Allergies (verified) Aspirin, Contrast media [iodinated diagnostic agents], Elemental sulfur, Ibuprofen, Latex, Penicillins, Rubbing alcohol [alcohol], and Adhesive [tape]   History: Past Medical History:  Diagnosis Date  . Allergy   . Anemia   . Asthma   . Hypothyroidism   . Thyroid disease    hypothyroid   Past Surgical History:  Procedure Laterality Date  . BREAST MASS EXCISION Left 1974   benign  . COLONOSCOPY WITH PROPOFOL N/A 10/28/2019   Procedure: COLONOSCOPY WITH PROPOFOL;  Surgeon: Pasty Spillers, MD;  Location: [facility] ENDOSCOPY;  Service: Endoscopy;  Laterality: N/A;  . ENDOSCOPIC MUCOSAL RESECTION N/A 11/22/2019   Procedure: ENDOSCOPIC MUCOSAL RESECTION;  Surgeon: Meridee Score Netty Starring., MD;  Location: [facility] ENDOSCOPY;  Service: Gastroenterology;  Laterality: N/A;  . ESOPHAGOGASTRODUODENOSCOPY (EGD) WITH PROPOFOL N/A 10/28/2019   Procedure: ESOPHAGOGASTRODUODENOSCOPY (EGD) WITH PROPOFOL;  Surgeon: Pasty Spillers, MD;  Location: [facility] ENDOSCOPY;  Service: Endoscopy;  Laterality: N/A;  . ESOPHAGOGASTRODUODENOSCOPY (EGD) WITH PROPOFOL N/A 11/22/2019   Procedure: ESOPHAGOGASTRODUODENOSCOPY (EGD) WITH PROPOFOL;  Surgeon: Meridee Score Netty Starring., MD;  Location: [facility] ENDOSCOPY;  Service: Gastroenterology;  Laterality: N/A;  . HEMOSTASIS  CLIP PLACEMENT  11/22/2019   Procedure: HEMOSTASIS CLIP PLACEMENT;  Surgeon: Lemar Lofty., MD;  Location: [facility] ENDOSCOPY;  Service: Gastroenterology;;  . HEMOSTASIS CONTROL  11/22/2019   Procedure:  HEMOSTASIS CONTROL;  Surgeon: Lemar Lofty., MD;  Location: [facility] ENDOSCOPY;  Service: Gastroenterology;;  epi   . nodule removed  1982   from thyroid -benign  . PARATHYROIDECTOMY  1982   patient denies this dx  . SUBMUCOSAL LIFTING INJECTION  11/22/2019   Procedure: SUBMUCOSAL LIFTING INJECTION;  Surgeon: Meridee Score Netty Starring., MD;  Location: [facility] ENDOSCOPY;  Service: Gastroenterology;;  . TUBAL LIGATION    . WISDOM TOOTH EXTRACTION     Family History  Problem Relation Age of Onset  . Cancer Mother        Breast, Paget's removed nipple 62, breast 2001  . Colon polyps Mother   . Paget's disease of bone Mother   . Hypertension Mother   . Hyperlipidemia Mother   . Heart disease Father   . Heart attack Father   . Heart disease Maternal Grandfather   . Stroke Paternal Grandmother   . Colon cancer Neg Hx   . Esophageal cancer Neg Hx   . Inflammatory bowel disease Neg Hx   . Liver disease Neg Hx   . Pancreatic cancer Neg Hx   . Rectal cancer Neg Hx   . Stomach cancer Neg Hx    Social History   Socioeconomic History  . Marital status: Married    Spouse name: Not on file  . Number of children: Not on file  . Years of education: Not on file  . Highest education level: Not on file  Occupational History  . Occupation: retired  Tobacco Use  . Smoking status: Never Smoker  . Smokeless tobacco: Never Used  Vaping Use  . Vaping Use: Never used  Substance and Sexual Activity  . Alcohol use: Never  . Drug use: Never  . Sexual activity: Not on file  Other Topics Concern  . Not on file  Social History Narrative  . Not on file   Social Determinants of Health   Financial Resource Strain: Low Risk   . Difficulty of Paying Living Expenses: Not hard at all  Food Insecurity: No Food Insecurity  . Worried About Programme researcher, broadcasting/film/video in the Last Year: Never true  . Ran Out of Food in the Last Year: Never true  Transportation Needs: No Transportation Needs  . Lack of  Transportation (Medical): No  . Lack of Transportation (Non-Medical): No  Physical Activity: Inactive  . Days of Exercise per Week: 0 days  . Minutes of Exercise per Session: 0 min  Stress: Stress Concern Present  . Feeling of Stress : To some extent  Social Connections: Not on file    Tobacco Counseling Counseling given: Not Answered   Clinical Intake:  Pre-visit preparation completed: Yes  Pain : No/denies pain     Nutritional Status: BMI > 30  Obese Nutritional Risks: None Diabetes: No  How often do you need to have someone help you when you read instructions, pamphlets, or other written materials from your doctor or pharmacy?: 1 - Never What is the last grade level you completed in school?: 3.5 yrs college  Diabetic? no  Interpreter Needed?: No  Information entered by :: NAllen LPN   Activities of Daily Living In your present state of health, do you have any difficulty performing the following activities: 04/03/2021  Hearing? N  Vision? N  Difficulty concentrating  or making decisions? N  Walking or climbing stairs? N  Dressing or bathing? N  Doing errands, shopping? N  Preparing Food and eating ? N  Using the Toilet? N  In the past six months, have you accidently leaked urine? N  Do you have problems with loss of bowel control? N  Managing your Medications? N  Managing your Finances? N  Housekeeping or managing your Housekeeping? N  Some recent data might be hidden    Patient Care Team: Smitty Cords, DO as PCP - General (Family Medicine) Rickard Patience, MD as Consulting Physician (Oncology)  Indicate any recent Medical Services you may have received from other than [facility] providers in the past year (date may be approximate).     Assessment:   This is a routine wellness examination for Dezarae.  Hearing/Vision screen  Hearing Screening   125Hz  250Hz  500Hz  1000Hz  2000Hz  3000Hz  4000Hz  6000Hz  8000Hz   Right ear:           Left ear:           Vision  Screening Comments: Regular eye exams, [facility]  Dietary issues and exercise activities discussed: Current Exercise Habits: The patient does not participate in regular exercise at present  Goals Addressed            This Visit's Progress   . Patient Stated       04/03/2021, wants to 200-210 pounds      Depression Screen PHQ 2/9 Scores 04/03/2021 04/25/2020 10/13/2019 04/05/2019 02/26/2019 01/04/2019 12/07/2018  PHQ - 2 Score 3 0 0 3 0 0 0  PHQ- 9 Score 6 - - 16 - - -    Fall Risk Fall Risk  04/03/2021 04/25/2020 10/13/2019 04/05/2019 02/26/2019  Falls in the past year? 0 0 0 0 0  Number falls in past yr: - 0 0 - -  Injury with Fall? - 0 0 - -  Risk for fall due to : No Fall Risks - - - -  Follow up Falls evaluation completed;Education provided;Falls prevention discussed Falls evaluation completed Falls evaluation completed Falls evaluation completed Falls evaluation completed    FALL RISK PREVENTION PERTAINING TO THE HOME:  Any stairs in or around the home? Yes  If so, are there any without handrails? No  Home free of loose throw rugs in walkways, pet beds, electrical cords, etc? Yes  Adequate lighting in your home to reduce risk of falls? Yes   ASSISTIVE DEVICES UTILIZED TO PREVENT FALLS:  Life alert? No  Use of a cane, walker or w/c? No  Grab bars in the bathroom? No  Shower chair or bench in shower? No  Elevated toilet seat or a handicapped toilet? No   TIMED UP AND GO:  Was the test performed? No   Cognitive Function:     6CIT Screen 04/03/2021  What Year? 0 points  What month? 0 points  What time? 0 points  Count back from 20 0 points  Months in reverse 0 points  Repeat phrase 0 points  Total Score 0    Immunizations Immunization History  Administered Date(s) Administered  . PFIZER(Purple Top)SARS-COV-2 Vaccination 01/08/2020, 01/29/2020  . Tdap 08/28/2018    TDAP status: Up to date  Flu Vaccine status: Declined, Education has been provided regarding  the importance of this vaccine but patient still declined. Advised may receive this vaccine at local pharmacy or Health Dept. Aware to provide a copy of the vaccination record if obtained from local pharmacy or Health  Dept. Verbalized acceptance and understanding.  Pneumococcal vaccine status: Declined,  Education has been provided regarding the importance of this vaccine but patient still declined. Advised may receive this vaccine at local pharmacy or Health Dept. Aware to provide a copy of the vaccination record if obtained from local pharmacy or Health Dept. Verbalized acceptance and understanding.   Covid-19 vaccine status: Completed vaccines  Qualifies for Shingles Vaccine? Yes   Zostavax completed No   Shingrix Completed?: No.    Education has been provided regarding the importance of this vaccine. Patient has been advised to call insurance company to determine out of pocket expense if they have not yet received this vaccine. Advised may also receive vaccine at local pharmacy or Health Dept. Verbalized acceptance and understanding.  Screening Tests Health Maintenance  Topic Date Due  . Zoster Vaccines- Shingrix (1 of 2) Never done  . PNA vac Low Risk Adult (1 of 2 - PCV13) Never done  . COVID-19 Vaccine (3 - Booster for Pfizer series) 06/30/2020  . INFLUENZA VACCINE  05/28/2021  . MAMMOGRAM  08/04/2021  . TETANUS/TDAP  08/28/2028  . COLONOSCOPY (Pts 45-33yrs Insurance coverage will need to be confirmed)  10/27/2029  . DEXA SCAN  Completed  . Hepatitis C Screening  Completed  . Pneumococcal Vaccine 53-54 Years old  Aged Out  . HPV VACCINES  Aged Out    Health Maintenance  Health Maintenance Due  Topic Date Due  . Zoster Vaccines- Shingrix (1 of 2) Never done  . PNA vac Low Risk Adult (1 of 2 - PCV13) Never done  . COVID-19 Vaccine (3 - Booster for Pfizer series) 06/30/2020    Colorectal cancer screening: Type of screening: Colonoscopy. Completed 10/28/2019. Repeat every 10  years  Mammogram status: Completed 08/17/2020. Repeat every year  Bone Density status: Completed 09/02/2018.   Lung Cancer Screening: (Low Dose CT Chest recommended if Age 78-80 years, 30 pack-year currently smoking OR have quit w/in 15years.) does not qualify.   Lung Cancer Screening Referral: no  Additional Screening:  Hepatitis C Screening: does qualify; Completed 03/06/2011  Vision Screening: Recommended annual ophthalmology exams for early detection of glaucoma and other disorders of the eye. Is the patient up to date with their annual eye exam?  Yes  Who is the provider or what is the name of the office in which the patient attends annual eye exams? [facility] If pt is not established with a provider, would they like to be referred to a provider to establish care? No .   Dental Screening: Recommended annual dental exams for proper oral hygiene  Community Resource Referral / Chronic Care Management: CRR required this visit?  No   CCM required this visit?  No      Plan:     I have personally reviewed and noted the following in the patient's chart:   . Medical and social history . Use of alcohol, tobacco or illicit drugs  . Current medications and supplements including opioid prescriptions. Patient is not currently taking opioid prescriptions. . Functional ability and status . Nutritional status . Physical activity . Advanced directives . List of other physicians . Hospitalizations, surgeries, and ER visits in previous 12 months . Vitals . Screenings to include cognitive, depression, and falls . Referrals and appointments  In addition, I have reviewed and discussed with patient certain preventive protocols, quality metrics, and best practice recommendations. A written personalized care plan for preventive services as well as general preventive health recommendations were provided  to patient.     Mckay Brandt Barb Merino Ether Wolters, LPN   6/5/78466/04/2021   Nurse Notes:

## 2021-04-03 NOTE — Patient Instructions (Signed)
Ms. Genrich , Thank you for taking time to come for your Medicare Wellness Visit. I appreciate your ongoing commitment to your health goals. Please review the following plan we discussed and let me know if I can assist you in the future.   Screening recommendations/referrals: Colonoscopy: completed 10/28/2019 Mammogram: completed 08/17/2020 Bone Density: completed 09/02/2018 Recommended yearly ophthalmology/optometry visit for glaucoma screening and checkup Recommended yearly dental visit for hygiene and checkup  Vaccinations: Influenza vaccine: decline Pneumococcal vaccine: due Tdap vaccine: completed 08/28/2018, due 08/28/2028 Shingles vaccine: discussed   Covid-19: 10/24/2020, 01/29/2020, 01/08/2020  Advanced directives: Advance directive discussed with you today.   Conditions/risks identified: none  Next appointment: Follow up in one year for your annual wellness visit    Preventive Care 65 Years and Older, Female Preventive care refers to lifestyle choices and visits with your health care provider that can promote health and wellness. What does preventive care include?  A yearly physical exam. This is also called an annual well check.  Dental exams once or twice a year.  Routine eye exams. Ask your health care provider how often you should have your eyes checked.  Personal lifestyle choices, including:  Daily care of your teeth and gums.  Regular physical activity.  Eating a healthy diet.  Avoiding tobacco and drug use.  Limiting alcohol use.  Practicing safe sex.  Taking low-dose aspirin every day.  Taking vitamin and mineral supplements as recommended by your health care provider. What happens during an annual well check? The services and screenings done by your health care provider during your annual well check will depend on your age, overall health, lifestyle risk factors, and family history of disease. Counseling  Your health care provider may ask you questions  about your:  Alcohol use.  Tobacco use.  Drug use.  Emotional well-being.  Home and relationship well-being.  Sexual activity.  Eating habits.  History of falls.  Memory and ability to understand (cognition).  Work and work Astronomer.  Reproductive health. Screening  You may have the following tests or measurements:  Height, weight, and BMI.  Blood pressure.  Lipid and cholesterol levels. These may be checked every 5 years, or more frequently if you are over 78 years old.  Skin check.  Lung cancer screening. You may have this screening every year starting at age 33 if you have a 30-pack-year history of smoking and currently smoke or have quit within the past 15 years.  Fecal occult blood test (FOBT) of the stool. You may have this test every year starting at age 48.  Flexible sigmoidoscopy or colonoscopy. You may have a sigmoidoscopy every 5 years or a colonoscopy every 10 years starting at age 18.  Hepatitis C blood test.  Hepatitis B blood test.  Sexually transmitted disease (STD) testing.  Diabetes screening. This is done by checking your blood sugar (glucose) after you have not eaten for a while (fasting). You may have this done every 1-3 years.  Bone density scan. This is done to screen for osteoporosis. You may have this done starting at age 73.  Mammogram. This may be done every 1-2 years. Talk to your health care provider about how often you should have regular mammograms. Talk with your health care provider about your test results, treatment options, and if necessary, the need for more tests. Vaccines  Your health care provider may recommend certain vaccines, such as:  Influenza vaccine. This is recommended every year.  Tetanus, diphtheria, and acellular pertussis (Tdap, Td) vaccine.  You may need a Td booster every 10 years.  Zoster vaccine. You may need this after age 37.  Pneumococcal 13-valent conjugate (PCV13) vaccine. One dose is  recommended after age 77.  Pneumococcal polysaccharide (PPSV23) vaccine. One dose is recommended after age 10. Talk to your health care provider about which screenings and vaccines you need and how often you need them. This information is not intended to replace advice given to you by your health care provider. Make sure you discuss any questions you have with your health care provider. Document Released: 11/10/2015 Document Revised: 07/03/2016 Document Reviewed: 08/15/2015 Elsevier Interactive Patient Education  2017 [place] Prevention in the Home Falls can cause injuries. They can happen to people of all ages. There are many things you can do to make your home safe and to help prevent falls. What can I do on the outside of my home?  Regularly fix the edges of walkways and driveways and fix any cracks.  Remove anything that might make you trip as you walk through a door, such as a raised step or threshold.  Trim any bushes or trees on the path to your home.  Use bright outdoor lighting.  Clear any walking paths of anything that might make someone trip, such as rocks or tools.  Regularly check to see if handrails are loose or broken. Make sure that both sides of any steps have handrails.  Any raised decks and porches should have guardrails on the edges.  Have any leaves, snow, or ice cleared regularly.  Use sand or salt on walking paths during winter.  Clean up any spills in your garage right away. This includes oil or grease spills. What can I do in the bathroom?  Use night lights.  Install grab bars by the toilet and in the tub and shower. Do not use towel bars as grab bars.  Use non-skid mats or decals in the tub or shower.  If you need to sit down in the shower, use a plastic, non-slip stool.  Keep the floor dry. Clean up any water that spills on the floor as soon as it happens.  Remove soap buildup in the tub or shower regularly.  Attach bath mats  securely with double-sided non-slip rug tape.  Do not have throw rugs and other things on the floor that can make you trip. What can I do in the bedroom?  Use night lights.  Make sure that you have a light by your bed that is easy to reach.  Do not use any sheets or blankets that are too big for your bed. They should not hang down onto the floor.  Have a firm chair that has side arms. You can use this for support while you get dressed.  Do not have throw rugs and other things on the floor that can make you trip. What can I do in the kitchen?  Clean up any spills right away.  Avoid walking on wet floors.  Keep items that you use a lot in easy-to-reach places.  If you need to reach something above you, use a strong step stool that has a grab bar.  Keep electrical cords out of the way.  Do not use floor polish or wax that makes floors slippery. If you must use wax, use non-skid floor wax.  Do not have throw rugs and other things on the floor that can make you trip. What can I do with my stairs?  Do not leave any  items on the stairs.  Make sure that there are handrails on both sides of the stairs and use them. Fix handrails that are broken or loose. Make sure that handrails are as long as the stairways.  Check any carpeting to make sure that it is firmly attached to the stairs. Fix any carpet that is loose or worn.  Avoid having throw rugs at the top or bottom of the stairs. If you do have throw rugs, attach them to the floor with carpet tape.  Make sure that you have a light switch at the top of the stairs and the bottom of the stairs. If you do not have them, ask someone to add them for you. What else can I do to help prevent falls?  Wear shoes that:  Do not have high heels.  Have rubber bottoms.  Are comfortable and fit you well.  Are closed at the toe. Do not wear sandals.  If you use a stepladder:  Make sure that it is fully opened. Do not climb a closed  stepladder.  Make sure that both sides of the stepladder are locked into place.  Ask someone to hold it for you, if possible.  Clearly mark and make sure that you can see:  Any grab bars or handrails.  First and last steps.  Where the edge of each step is.  Use tools that help you move around (mobility aids) if they are needed. These include:  Canes.  Walkers.  Scooters.  Crutches.  Turn on the lights when you go into a dark area. Replace any light bulbs as soon as they burn out.  Set up your furniture so you have a clear path. Avoid moving your furniture around.  If any of your floors are uneven, fix them.  If there are any pets around you, be aware of where they are.  Review your medicines with your doctor. Some medicines can make you feel dizzy. This can increase your chance of falling. Ask your doctor what other things that you can do to help prevent falls. This information is not intended to replace advice given to you by your health care provider. Make sure you discuss any questions you have with your health care provider. Document Released: 08/10/2009 Document Revised: 03/21/2016 Document Reviewed: 11/18/2014 Elsevier Interactive Patient Education  2017 [employer].

## 2021-08-17 ENCOUNTER — Ambulatory Visit (INDEPENDENT_AMBULATORY_CARE_PROVIDER_SITE_OTHER): Payer: Medicare Other | Admitting: Family Medicine

## 2021-08-17 ENCOUNTER — Other Ambulatory Visit: Payer: Self-pay

## 2021-08-17 ENCOUNTER — Encounter: Payer: Self-pay | Admitting: Family Medicine

## 2021-08-17 VITALS — BP 136/78 | HR 62 | Ht 68.5 in | Wt 222.6 lb

## 2021-08-17 DIAGNOSIS — Z0000 Encounter for general adult medical examination without abnormal findings: Secondary | ICD-10-CM

## 2021-08-17 DIAGNOSIS — E538 Deficiency of other specified B group vitamins: Secondary | ICD-10-CM

## 2021-08-17 DIAGNOSIS — R030 Elevated blood-pressure reading, without diagnosis of hypertension: Secondary | ICD-10-CM | POA: Diagnosis not present

## 2021-08-17 DIAGNOSIS — J3089 Other allergic rhinitis: Secondary | ICD-10-CM

## 2021-08-17 DIAGNOSIS — E669 Obesity, unspecified: Secondary | ICD-10-CM

## 2021-08-17 DIAGNOSIS — E039 Hypothyroidism, unspecified: Secondary | ICD-10-CM

## 2021-08-17 DIAGNOSIS — E7800 Pure hypercholesterolemia, unspecified: Secondary | ICD-10-CM

## 2021-08-17 DIAGNOSIS — R7303 Prediabetes: Secondary | ICD-10-CM

## 2021-08-17 DIAGNOSIS — Z23 Encounter for immunization: Secondary | ICD-10-CM | POA: Diagnosis not present

## 2021-08-17 MED ORDER — MONTELUKAST SODIUM 10 MG PO TABS: 10.0000 mg | ORAL_TABLET | Freq: Every day | ORAL | 3 refills | Status: DC

## 2021-08-17 NOTE — Assessment & Plan Note (Signed)
Stable PreDM A1c 6.2 result, prior 6.2 to 6.3  Plan:  1. Not on any therapy currently  2. Encourage improved lifestyle - low carb, low sugar diet, reduce portion size, continue improving regular exercise  Likely check A1c only q 6-12 months now given stability unless wt gain significantly

## 2021-08-17 NOTE — Patient Instructions (Addendum)
Thank you for coming to the office today.  Prevnar-20 pneumonia shot today, 1 more dose in 1 year.  New COVID Booster at the pharmacy free of charge, the updated Omicron booster  Try the Debrox Ear Wax Removal Kit, Right has more than Left.  Ordered Singulair.   Please schedule a Follow-up Appointment to: Return in about 1 year (around 08/17/2022) for 1 year Annual Physical AM apt fasting lab AFTER.  If you have any other questions or concerns, please feel free to call the office or send a message through [place]. You may also schedule an earlier appointment if necessary.  Additionally, you may be receiving a survey about your experience at our office within a few days to 1 week by e-mail or mail. We value your feedback.  Nobie Putnam, DO [place]

## 2021-08-17 NOTE — Assessment & Plan Note (Signed)
Manual repeat BP improved No prior dx HTN Not on therapy

## 2021-08-17 NOTE — Assessment & Plan Note (Addendum)
Encourage improved lifestyle - low carb/cholesterol, reduce portion size, continue improving regular exercise Follow-up yearly lipid

## 2021-08-17 NOTE — Progress Notes (Signed)
Subjective:    Patient ID: Amanda Pierce, female    DOB: 07-06-1956, 65 y.o.   MRN: [id number]  Amanda Pierce is a 65 y.o. female presenting on 08/17/2021 for Annual Exam   HPI  Here for Annual Physical and Lab review.   FOLLOW-UP Hypothyroidism  Followed by [facility]  Levothyroxine daily Denies temperature instability, edema, hair or skin changes  Pre-Diabetes / Obesity BMI >33 Prior history past 3-4 years, has had A1c 6.2 to 6.3 range. A1c 6.2 last. Meds: not on  medication Lifestyle: - Diet (balanced, admits still improving diet but not following regimen, she was mainly reducing portion size) - Exercise with walking - Limited time and stress due to her husband's health and she will be taking him to receive additional care in future. Denies hypoglycemia   Elevated BP - home readings 130s/70s - 130s / 80s goal to improve through exercise/lifestyle   Anemia IDA - followed by Heme/Onc upcoming labs 10/2021 Long history of prior anemia, reported in past with required iron treatment   Health Maintenance:  Mammogram last done 07/2019. [facility], will need to receive copy of last done in 2021. Due for upcoming.  Depression screen [facility] 2/9 04/03/2021 04/25/2020 10/13/2019  Decreased Interest 0 0 0  Down, Depressed, Hopeless 3 0 0  PHQ - 2 Score 3 0 0  Altered sleeping 0 - -  Tired, decreased energy 3 - -  Change in appetite 0 - -  Feeling bad or failure about yourself  0 - -  Trouble concentrating 0 - -  Moving slowly or fidgety/restless 0 - -  Suicidal thoughts 0 - -  PHQ-9 Score 6 - -  Difficult doing work/chores Somewhat difficult - -    Past Medical History:  Diagnosis Date   Allergy    Anemia    Asthma    Hypothyroidism    Thyroid disease    hypothyroid   Past Surgical History:  Procedure Laterality Date   BREAST MASS EXCISION Left 1974   benign   COLONOSCOPY WITH PROPOFOL N/A 10/28/2019   Procedure: COLONOSCOPY WITH PROPOFOL;   Surgeon: Pasty Spillers, MD;  Location: [facility] ENDOSCOPY;  Service: Endoscopy;  Laterality: N/A;   ENDOSCOPIC MUCOSAL RESECTION N/A 11/22/2019   Procedure: ENDOSCOPIC MUCOSAL RESECTION;  Surgeon: Meridee Score Netty Starring., MD;  Location: [facility] ENDOSCOPY;  Service: Gastroenterology;  Laterality: N/A;   ESOPHAGOGASTRODUODENOSCOPY (EGD) WITH PROPOFOL N/A 10/28/2019   Procedure: ESOPHAGOGASTRODUODENOSCOPY (EGD) WITH PROPOFOL;  Surgeon: Pasty Spillers, MD;  Location: [facility] ENDOSCOPY;  Service: Endoscopy;  Laterality: N/A;   ESOPHAGOGASTRODUODENOSCOPY (EGD) WITH PROPOFOL N/A 11/22/2019   Procedure: ESOPHAGOGASTRODUODENOSCOPY (EGD) WITH PROPOFOL;  Surgeon: Meridee Score Netty Starring., MD;  Location: [facility] ENDOSCOPY;  Service: Gastroenterology;  Laterality: N/A;   HEMOSTASIS CLIP PLACEMENT  11/22/2019   Procedure: HEMOSTASIS CLIP PLACEMENT;  Surgeon: Meridee Score Netty Starring., MD;  Location: [facility] ENDOSCOPY;  Service: Gastroenterology;;   HEMOSTASIS CONTROL  11/22/2019   Procedure: HEMOSTASIS CONTROL;  Surgeon: Lemar Lofty., MD;  Location: [facility] ENDOSCOPY;  Service: Gastroenterology;;  epi    nodule removed  1982   from thyroid -benign   PARATHYROIDECTOMY  1982   patient denies this dx   SUBMUCOSAL LIFTING INJECTION  11/22/2019   Procedure: SUBMUCOSAL LIFTING INJECTION;  Surgeon: Lemar Lofty., MD;  Location: [facility] ENDOSCOPY;  Service: Gastroenterology;;   TUBAL LIGATION     WISDOM TOOTH EXTRACTION     Social History   Socioeconomic History   Marital status: Married  Spouse name: Not on file   Number of children: Not on file   Years of education: Not on file   Highest education level: Not on file  Occupational History   Occupation: retired  Tobacco Use   Smoking status: Never   Smokeless tobacco: Never  Vaping Use   Vaping Use: Never used  Substance and Sexual Activity   Alcohol use: Never   Drug use: Never   Sexual activity: Not on file  Other Topics Concern   Not on file   Social History Narrative   Not on file   Social Determinants of Health   Financial Resource Strain: Low Risk    Difficulty of Paying Living Expenses: Not hard at all  Food Insecurity: No Food Insecurity   Worried About Programme researcher, broadcasting/film/video in the Last Year: Never true   Ran Out of Food in the Last Year: Never true  Transportation Needs: No Transportation Needs   Lack of Transportation (Medical): No   Lack of Transportation (Non-Medical): No  Physical Activity: Inactive   Days of Exercise per Week: 0 days   Minutes of Exercise per Session: 0 min  Stress: Stress Concern Present   Feeling of Stress : To some extent  Social Connections: Not on file  Intimate Partner Violence: Not on file   Family History  Problem Relation Age of Onset   Cancer Mother        Breast, Paget's removed nipple 79, breast 2001   Colon polyps Mother    Paget's disease of bone Mother    Hypertension Mother    Hyperlipidemia Mother    Heart disease Father    Heart attack Father    Heart disease Maternal Grandfather    Stroke Paternal Grandmother    Colon cancer Neg Hx    Esophageal cancer Neg Hx    Inflammatory bowel disease Neg Hx    Liver disease Neg Hx    Pancreatic cancer Neg Hx    Rectal cancer Neg Hx    Stomach cancer Neg Hx    Current Outpatient Medications on File Prior to Visit  Medication Sig   fexofenadine (ALLEGRA) 180 MG tablet Take 180 mg by mouth daily as needed for allergies or rhinitis.   levothyroxine (SYNTHROID) 150 MCG tablet Take by mouth.   ascorbic acid ([employer] VITAMIN C) 250 MG tablet TAKE 2 TABLETS (500 MG TOTAL) BY MOUTH DAILY. (Patient not taking: No sig reported)   FERREX 150 150 MG capsule TAKE 1 CAPSULE BY MOUTH EVERY DAY (Patient not taking: No sig reported)   No current facility-administered medications on file prior to visit.    Review of Systems  Constitutional:  Negative for activity change, appetite change, chills, diaphoresis, fatigue and fever.  HENT:   Negative for congestion and hearing loss.   Eyes:  Negative for visual disturbance.  Respiratory:  Negative for cough, chest tightness, shortness of breath and wheezing.   Cardiovascular:  Negative for chest pain, palpitations and leg swelling.  Gastrointestinal:  Negative for abdominal pain, constipation, diarrhea, nausea and vomiting.  Genitourinary:  Negative for dysuria, frequency and hematuria.  Musculoskeletal:  Negative for arthralgias and neck pain.  Skin:  Negative for rash.  Neurological:  Negative for dizziness, weakness, light-headedness, numbness and headaches.  Hematological:  Negative for adenopathy.  Psychiatric/Behavioral:  Negative for behavioral problems, dysphoric mood and sleep disturbance.   Per HPI unless specifically indicated above     Objective:    BP 136/78 (BP Location: Left Arm,  Patient Position: Sitting, Cuff Size: Large)   Pulse 62   Ht 5' 8.5" (1.74 m)   Wt 222 lb 9.6 oz (101 kg)   LMP  (LMP Unknown)   SpO2 100%   BMI 33.35 kg/m   Wt Readings from Last 3 Encounters:  08/17/21 222 lb 9.6 oz (101 kg)  04/03/21 232 lb (105.2 kg)  05/10/20 232 lb 6.4 oz (105.4 kg)    Physical Exam Vitals and nursing note reviewed.  Constitutional:      General: She is not in acute distress.    Appearance: She is well-developed. She is not diaphoretic.     Comments: Well-appearing, comfortable, cooperative  HENT:     Head: Normocephalic and atraumatic.  Eyes:     General:        Right eye: No discharge.        Left eye: No discharge.     Conjunctiva/sclera: Conjunctivae normal.     Pupils: Pupils are equal, round, and reactive to light.  Neck:     Thyroid: No thyromegaly.  Cardiovascular:     Rate and Rhythm: Normal rate and regular rhythm.     Pulses: Normal pulses.     Heart sounds: Normal heart sounds. No murmur heard. Pulmonary:     Effort: Pulmonary effort is normal. No respiratory distress.     Breath sounds: Normal breath sounds. No wheezing or  rales.  Abdominal:     General: Bowel sounds are normal. There is no distension.     Palpations: Abdomen is soft. There is no mass.     Tenderness: There is no abdominal tenderness.  Musculoskeletal:        General: No tenderness. Normal range of motion.     Cervical back: Normal range of motion and neck supple.     Comments: Upper / Lower Extremities: - Normal muscle tone, strength bilateral upper extremities 5/5, lower extremities 5/5  Lymphadenopathy:     Cervical: No cervical adenopathy.  Skin:    General: Skin is warm and dry.     Findings: No erythema or rash.  Neurological:     Mental Status: She is alert and oriented to person, place, and time.     Comments: Distal sensation intact to light touch all extremities  Psychiatric:        Mood and Affect: Mood normal.        Behavior: Behavior normal.        Thought Content: Thought content normal.     Comments: Well groomed, good eye contact, normal speech and thoughts   Results for orders placed or performed in visit on 11/06/20  Ferritin  Result Value Ref Range   Ferritin 23 11 - 307 ng/mL  Comprehensive metabolic panel  Result Value Ref Range   Sodium 138 135 - 145 mmol/L   Potassium 3.7 3.5 - 5.1 mmol/L   Chloride 104 98 - 111 mmol/L   CO2 26 22 - 32 mmol/L   Glucose, Bld 139 (H) 70 - 99 mg/dL   BUN 12 8 - 23 mg/dL   Creatinine, Ser 8.54 0.44 - 1.00 mg/dL   Calcium 8.7 (L) 8.9 - 10.3 mg/dL   Total Protein 8.0 6.5 - 8.1 g/dL   Albumin 3.9 3.5 - 5.0 g/dL   AST 20 15 - 41 U/L   ALT 13 0 - 44 U/L   Alkaline Phosphatase 60 38 - 126 U/L   Total Bilirubin 0.4 0.3 - 1.2 mg/dL   GFR, Estimated >62 >  60 mL/min   Anion gap 8 5 - 15  CBC with Differential/Platelet  Result Value Ref Range   WBC 5.2 4.0 - 10.5 K/uL   RBC 4.62 3.87 - 5.11 MIL/uL   Hemoglobin 12.2 12.0 - 15.0 g/dL   HCT 60.4 54.0 - 98.1 %   MCV 82.9 80.0 - 100.0 fL   MCH 26.4 26.0 - 34.0 pg   MCHC 31.9 30.0 - 36.0 g/dL   RDW 19.1 47.8 - 29.5 %    Platelets 267 150 - 400 K/uL   nRBC 0.0 0.0 - 0.2 %   Neutrophils Relative % 48 %   Neutro Abs 2.5 1.7 - 7.7 K/uL   Lymphocytes Relative 44 %   Lymphs Abs 2.3 0.7 - 4.0 K/uL   Monocytes Relative 6 %   Monocytes Absolute 0.3 0.1 - 1.0 K/uL   Eosinophils Relative 1 %   Eosinophils Absolute 0.0 0.0 - 0.5 K/uL   Basophils Relative 0 %   Basophils Absolute 0.0 0.0 - 0.1 K/uL   Immature Granulocytes 1 %   Abs Immature Granulocytes 0.04 0.00 - 0.07 K/uL  Iron and TIBC  Result Value Ref Range   Iron 65 28 - 170 ug/dL   TIBC 621 308 - 657 ug/dL   Saturation Ratios 17 10.4 - 31.8 %   UIBC 314 ug/dL      Assessment & Plan:   Problem List Items Addressed This Visit     Pure hypercholesterolemia    Encourage improved lifestyle - low carb/cholesterol, reduce portion size, continue improving regular exercise Follow-up yearly lipid      Relevant Orders   Lipid panel   T4, free   Pre-diabetes    Stable PreDM A1c 6.2 result, prior 6.2 to 6.3  Plan:  1. Not on any therapy currently  2. Encourage improved lifestyle - low carb, low sugar diet, reduce portion size, continue improving regular exercise  Likely check A1c only q 6-12 months now given stability unless wt gain significantly      Relevant Orders   Hemoglobin A1c   Obesity (BMI 30.0-34.9)   Hypothyroidism (acquired)    Followed by [facility] On Levothyroxine daily      Relevant Medications   levothyroxine (SYNTHROID) 150 MCG tablet   Other Relevant Orders   T4, free   Vitamin B12   Elevated BP without diagnosis of hypertension    Manual repeat BP improved No prior dx HTN Not on therapy      Other Visit Diagnoses     Annual physical exam    -  Primary   Relevant Orders   Hemoglobin A1c   Lipid panel   T4, free   Need for vaccination for Strep pneumoniae       Relevant Orders   Pneumococcal conjugate vaccine 20-valent (Completed)   Vitamin B12 deficiency       Relevant Orders   Vitamin B12    Environmental and seasonal allergies       Relevant Medications   montelukast (SINGULAIR) 10 MG tablet       Updated Health Maintenance information Prevnar 20 Reviewed recent lab results with patient Encouraged improvement to lifestyle with diet and exercise Goal of weight loss    Meds ordered this encounter  Medications   montelukast (SINGULAIR) 10 MG tablet    Sig: Take 1 tablet (10 mg total) by mouth at bedtime.    Dispense:  90 tablet    Refill:  3  Follow up plan: Return in about 1 year (around 08/17/2022) for 1 year Annual Physical AM apt fasting lab AFTER.  Saralyn Pilar, DO [facility] 08/17/2021, 8:13 AM

## 2021-08-17 NOTE — Assessment & Plan Note (Signed)
Followed by [facility] On Levothyroxine daily

## 2021-08-18 LAB — LIPID PANEL
Cholesterol: 225 mg/dL — ABNORMAL HIGH (ref <200)
HDL: 54 mg/dL (ref >=50)
LDL Cholesterol (Calc): 150 mg/dL (calc) — ABNORMAL HIGH
Non-HDL Cholesterol (Calc): 171 mg/dL (calc) — ABNORMAL HIGH (ref <130)
Total CHOL/HDL Ratio: 4.2 (calc) (ref <5.0)
Triglycerides: 101 mg/dL (ref <150)

## 2021-08-18 LAB — HEMOGLOBIN A1C
Hgb A1c MFr Bld: 6.1 % of total Hgb — ABNORMAL HIGH (ref <5.7)
Mean Plasma Glucose: 128 mg/dL
eAG (mmol/L): 7.1 mmol/L

## 2021-08-18 LAB — VITAMIN B12: Vitamin B-12: 564 pg/mL (ref 200–1100)

## 2021-08-18 LAB — T4, FREE: Free T4: 1.4 ng/dL (ref 0.8–1.8)

## 2021-10-19 LAB — HM MAMMOGRAPHY

## 2021-10-30 ENCOUNTER — Other Ambulatory Visit: Payer: Self-pay

## 2021-10-30 ENCOUNTER — Encounter: Payer: Self-pay | Admitting: Family Medicine

## 2021-10-30 ENCOUNTER — Ambulatory Visit (INDEPENDENT_AMBULATORY_CARE_PROVIDER_SITE_OTHER): Payer: Medicare Other | Admitting: Family Medicine

## 2021-10-30 VITALS — Ht 68.5 in | Wt 222.0 lb

## 2021-10-30 DIAGNOSIS — J0110 Acute frontal sinusitis, unspecified: Secondary | ICD-10-CM

## 2021-10-30 MED ORDER — LEVOFLOXACIN 500 MG PO TABS: 500.0000 mg | ORAL_TABLET | Freq: Every day | ORAL | 0 refills | Status: DC

## 2021-10-30 MED ORDER — PREDNISONE 10 MG PO TABS: ORAL_TABLET | ORAL | 0 refills | Status: DC

## 2021-10-30 MED ORDER — FLUTICASONE PROPIONATE 50 MCG/ACT NA SUSP: 2.0000 | Freq: Every day | NASAL | 3 refills | Status: DC

## 2021-10-30 NOTE — Progress Notes (Signed)
Virtual Visit via Telephone The purpose of this virtual visit is to provide medical care while limiting exposure to the novel coronavirus (COVID19) for both patient and office staff.  Consent was obtained for phone visit:  Yes.   Answered questions that patient had about telehealth interaction:  Yes.   I discussed the limitations, risks, security and privacy concerns of performing an evaluation and management service by telephone. I also discussed with the patient that there may be a patient responsible charge related to this service. The patient expressed understanding and agreed to proceed.  Patient Location: Home [facility]  Participants in virtual visit: - Patient: Amanda Pierce - CMA: Burnell BlanksEmily L Williams, CMA - Provider: Dr Althea CharonKaramalegos  ---------------------------------------------------------------------- Chief Complaint  Patient presents with   Cough    S: Reviewed CMA documentation. I have called patient and gathered additional HPI as follows:  Sinusitis COUGH, Post nasal drainage History allergies Reports that symptoms started last week Thurs/Friday, she developed throat dry, and some coughing, has some gagging with throat, episodic symptoms worsen at night. Post nasal drainage associated, and sneezing coughing. - Tried OTC Allegra Singulair Home COVID test today 10/30/21, negative.  Denies any known or suspected exposure to person with or possibly with COVID19.  Denies any fevers, chills, sweats, body ache, shortness of breath, sinus pain or pressure, headache, abdominal pain, diarrhea  Past Medical History:  Diagnosis Date   Allergy    Anemia    Asthma    Hypothyroidism    Thyroid disease    hypothyroid   Social History   Tobacco Use   Smoking status: Never   Smokeless tobacco: Never  Vaping Use   Vaping Use: Never used  Substance Use Topics   Alcohol use: Never   Drug use: Never    Current Outpatient Medications:     fexofenadine (ALLEGRA) 180 MG tablet, Take 180 mg by mouth daily as needed for allergies or rhinitis., Disp: , Rfl:    fluticasone (FLONASE) 50 MCG/ACT nasal spray, Place 2 sprays into both nostrils daily. Use for 4-6 weeks then stop and use seasonally or as needed., Disp: 16 g, Rfl: 3   levofloxacin (LEVAQUIN) 500 MG tablet, Take 1 tablet (500 mg total) by mouth daily. For 7 days, Disp: 7 tablet, Rfl: 0   levothyroxine (SYNTHROID) 150 MCG tablet, Take by mouth., Disp: , Rfl:    montelukast (SINGULAIR) 10 MG tablet, Take 1 tablet (10 mg total) by mouth at bedtime., Disp: 90 tablet, Rfl: 3   nystatin cream (MYCOSTATIN), Apply 1 application topically 2 (two) times daily., Disp: , Rfl:    predniSONE (DELTASONE) 10 MG tablet, Take 6 tabs with breakfast Day 1, 5 tabs Day 2, 4 tabs Day 3, 3 tabs Day 4, 2 tabs Day 5, 1 tab Day 6., Disp: 21 tablet, Rfl: 0   ascorbic acid (CVS VITAMIN C) 250 MG tablet, TAKE 2 TABLETS (500 MG TOTAL) BY MOUTH DAILY. (Patient not taking: Reported on 04/03/2021), Disp: 100 tablet, Rfl: 1   FERREX 150 150 MG capsule, TAKE 1 CAPSULE BY MOUTH EVERY DAY (Patient not taking: Reported on 11/08/2020), Disp: 30 capsule, Rfl: 2  [facility] 04/03/2021 04/25/2020 10/13/2019  Decreased Interest 0 0 0  Down, Depressed, Hopeless 3 0 0  PHQ - 2 Score 3 0 0  Altered sleeping 0 - -  Tired, decreased energy 3 - -  Change in appetite 0 - -  Feeling bad or failure about yourself  0 - -  Trouble concentrating 0 - -  Moving slowly or fidgety/restless 0 - -  Suicidal thoughts 0 - -  PHQ-9 Score 6 - -  Difficult doing work/chores Somewhat difficult - -    No flowsheet data found.  -------------------------------------------------------------------------- O: No physical exam performed due to remote telephone encounter.  Lab results reviewed.  Recent Results (from the past 2160 hour(s))  Hemoglobin A1c     Status: Abnormal   Collection Time: 08/17/21  8:44 AM  Result Value  Ref Range   Hgb A1c MFr Bld 6.1 (H) <5.7 % of total Hgb    Comment: For someone without known diabetes, a hemoglobin  A1c value between 5.7% and 6.4% is consistent with prediabetes and should be confirmed with a  follow-up test. . For someone with known diabetes, a value <7% indicates that their diabetes is well controlled. A1c targets should be individualized based on duration of diabetes, age, comorbid conditions, and other considerations. . This assay result is consistent with an increased risk of diabetes. . Currently, no consensus exists regarding use of hemoglobin A1c for diagnosis of diabetes for children. .    Mean Plasma Glucose 128 mg/dL   eAG (mmol/L) 7.1 mmol/L  Lipid panel     Status: Abnormal   Collection Time: 08/17/21  8:44 AM  Result Value Ref Range   Cholesterol 225 (H) <200 mg/dL   HDL 54 > OR = 50 mg/dL   Triglycerides 924 <268 mg/dL   LDL Cholesterol (Calc) 150 (H) mg/dL (calc)    Comment: Reference range: <100 . Desirable range <100 mg/dL for primary prevention;   <70 mg/dL for patients with CHD or diabetic patients  with > or = 2 CHD risk factors. Marland Kitchen LDL-C is now calculated using the Martin-Hopkins  calculation, which is a validated novel method providing  better accuracy than the Friedewald equation in the  estimation of LDL-C.  Horald Pollen et al. Lenox Ahr. 3419;622(29): 2061-2068  (http://education.[employer].com/faq/FAQ164)    Total CHOL/HDL Ratio 4.2 <5.0 (calc)   Non-HDL Cholesterol (Calc) 171 (H) <130 mg/dL (calc)    Comment: For patients with diabetes plus 1 major ASCVD risk  factor, treating to a non-HDL-C goal of <100 mg/dL  (LDL-C of <79 mg/dL) is considered a therapeutic  option.   T4, free     Status: None   Collection Time: 08/17/21  8:44 AM  Result Value Ref Range   Free T4 1.4 0.8 - 1.8 ng/dL  Vitamin G92     Status: None   Collection Time: 08/17/21  8:44 AM  Result Value Ref Range   Vitamin B-12 564 200 - 1,100 pg/mL     -------------------------------------------------------------------------- A&P:  Problem List Items Addressed This Visit   None Visit Diagnoses     Acute non-recurrent frontal sinusitis    -  Primary   Relevant Medications   fluticasone (FLONASE) 50 MCG/ACT nasal spray   predniSONE (DELTASONE) 10 MG tablet   levofloxacin (LEVAQUIN) 500 MG tablet      Consistent with acute frontal sinusitis, likely initially viral URI vs allergic rhinitis component with worsening concern for bacterial infection.   Plan: Start nasal steroid Flonase 2 sprays in each nostril daily for 4-6 weeks, may repeat course seasonally or as needed If not improving add Prednisone taper and levaquin antibiotic course. Has PCN allergy   Return criteria reviewed   Meds ordered this encounter  Medications   fluticasone (FLONASE) 50 MCG/ACT nasal spray    Sig: Place 2 sprays  into both nostrils daily. Use for 4-6 weeks then stop and use seasonally or as needed.    Dispense:  16 g    Refill:  3   predniSONE (DELTASONE) 10 MG tablet    Sig: Take 6 tabs with breakfast Day 1, 5 tabs Day 2, 4 tabs Day 3, 3 tabs Day 4, 2 tabs Day 5, 1 tab Day 6.    Dispense:  21 tablet    Refill:  0   levofloxacin (LEVAQUIN) 500 MG tablet    Sig: Take 1 tablet (500 mg total) by mouth daily. For 7 days    Dispense:  7 tablet    Refill:  0    Follow-up: - Return in 1 week if unresolved  Patient verbalizes understanding with the above medical recommendations including the limitation of remote medical advice.  Specific follow-up and call-back criteria were given for patient to follow-up or seek medical care more urgently if needed.   - Time spent in direct consultation with patient on phone: 7 minutes   Saralyn Pilar, DO [facility] 10/30/2021, 4:01 PM

## 2021-10-30 NOTE — Patient Instructions (Addendum)
Plan: Start nasal steroid Flonase 2 sprays in each nostril daily for 4-6 weeks, may repeat course seasonally or as needed If not improving add Prednisone taper and levaquin antibiotic course. Has PCN allergy  Please schedule a Follow-up Appointment to: Return if symptoms worsen or fail to improve.  If you have any other questions or concerns, please feel free to call the office or send a message through MyChart. You may also schedule an earlier appointment if necessary.  Additionally, you may be receiving a survey about your experience at our office within a few days to 1 week by e-mail or mail. We value your feedback.  Saralyn Pilar, DO [facility], [place]

## 2021-10-31 ENCOUNTER — Encounter: Payer: Self-pay | Admitting: Family Medicine

## 2021-11-01 ENCOUNTER — Other Ambulatory Visit: Payer: Self-pay | Admitting: *Deleted

## 2021-11-01 DIAGNOSIS — D509 Iron deficiency anemia, unspecified: Secondary | ICD-10-CM

## 2021-11-07 ENCOUNTER — Inpatient Hospital Stay: Payer: Medicare Other

## 2021-11-09 ENCOUNTER — Inpatient Hospital Stay: Payer: Medicare Other | Admitting: Oncology

## 2021-12-03 ENCOUNTER — Other Ambulatory Visit: Payer: Self-pay | Admitting: *Deleted

## 2021-12-03 DIAGNOSIS — D509 Iron deficiency anemia, unspecified: Secondary | ICD-10-CM

## 2021-12-10 ENCOUNTER — Other Ambulatory Visit: Payer: Self-pay

## 2021-12-10 ENCOUNTER — Inpatient Hospital Stay: Payer: Medicare Other | Attending: Oncology

## 2021-12-10 DIAGNOSIS — R7982 Elevated C-reactive protein (CRP): Secondary | ICD-10-CM | POA: Insufficient documentation

## 2021-12-10 DIAGNOSIS — D509 Iron deficiency anemia, unspecified: Secondary | ICD-10-CM | POA: Diagnosis not present

## 2021-12-10 LAB — IRON AND TIBC
Iron: 55 ug/dL (ref 28–170)
Saturation Ratios: 16 % (ref 10.4–31.8)
TIBC: 337 ug/dL (ref 250–450)
UIBC: 282 ug/dL

## 2021-12-10 LAB — COMPREHENSIVE METABOLIC PANEL
ALT: 12 U/L (ref 0–44)
AST: 21 U/L (ref 15–41)
Albumin: 3.9 g/dL (ref 3.5–5.0)
Alkaline Phosphatase: 74 U/L (ref 38–126)
Anion gap: 8 (ref 5–15)
BUN: 14 mg/dL (ref 8–23)
CO2: 26 mmol/L (ref 22–32)
Calcium: 8.9 mg/dL (ref 8.9–10.3)
Chloride: 102 mmol/L (ref 98–111)
Creatinine, Ser: 0.83 mg/dL (ref 0.44–1.00)
GFR, Estimated: >60 mL/min (ref >60)
Glucose, Bld: 115 mg/dL — ABNORMAL HIGH (ref 70–99)
Potassium: 4.2 mmol/L (ref 3.5–5.1)
Sodium: 136 mmol/L (ref 135–145)
Total Bilirubin: 0.3 mg/dL (ref 0.3–1.2)
Total Protein: 8.4 g/dL — ABNORMAL HIGH (ref 6.5–8.1)

## 2021-12-10 LAB — CBC WITH DIFFERENTIAL/PLATELET
Abs Immature Granulocytes: 0.01 10*3/uL (ref 0.00–0.07)
Basophils Absolute: 0 10*3/uL (ref 0.0–0.1)
Basophils Relative: 0 %
Eosinophils Absolute: 0.1 10*3/uL (ref 0.0–0.5)
Eosinophils Relative: 1 %
HCT: 42.2 % (ref 36.0–46.0)
Hemoglobin: 13.4 g/dL (ref 12.0–15.0)
Immature Granulocytes: 0 %
Lymphocytes Relative: 53 %
Lymphs Abs: 2.2 10*3/uL (ref 0.7–4.0)
MCH: 27.1 pg (ref 26.0–34.0)
MCHC: 31.8 g/dL (ref 30.0–36.0)
MCV: 85.4 fL (ref 80.0–100.0)
Monocytes Absolute: 0.3 10*3/uL (ref 0.1–1.0)
Monocytes Relative: 7 %
Neutro Abs: 1.7 10*3/uL (ref 1.7–7.7)
Neutrophils Relative %: 39 %
Platelets: 286 10*3/uL (ref 150–400)
RBC: 4.94 MIL/uL (ref 3.87–5.11)
RDW: 14.8 % (ref 11.5–15.5)
WBC: 4.2 10*3/uL (ref 4.0–10.5)
nRBC: 0 % (ref 0.0–0.2)

## 2021-12-10 LAB — FERRITIN: Ferritin: 25 ng/mL (ref 11–307)

## 2021-12-12 ENCOUNTER — Other Ambulatory Visit: Payer: Self-pay

## 2021-12-12 ENCOUNTER — Inpatient Hospital Stay (HOSPITAL_BASED_OUTPATIENT_CLINIC_OR_DEPARTMENT_OTHER): Payer: Medicare Other | Admitting: Oncology

## 2021-12-12 ENCOUNTER — Encounter: Payer: Self-pay | Admitting: Oncology

## 2021-12-12 DIAGNOSIS — D509 Iron deficiency anemia, unspecified: Secondary | ICD-10-CM | POA: Diagnosis not present

## 2021-12-12 DIAGNOSIS — R778 Other specified abnormalities of plasma proteins: Secondary | ICD-10-CM

## 2021-12-12 NOTE — Progress Notes (Signed)
HEMATOLOGY-ONCOLOGY TeleHEALTH VISIT PROGRESS NOTE  I connected with [name] on @ at  1:15 PM EST by video enabled telemedicine visit and verified that I am speaking with the correct person using two identifiers. I discussed the limitations, risks, security and privacy concerns of performing an evaluation and management service by telemedicine and the availability of in-person appointments. The patient expressed understanding and agreed to proceed.   Other persons participating in the visit and their role in the encounter:  None  Patient's location: Home  Provider's location: office Chief Complaint: Iron deficiency anemia   INTERVAL HISTORY Amanda Pierce is a 66 y.o. female who has above history reviewed by me today presents for follow up visit for management of iron deficiency anemia Patient reports feeling well.  She is no longer taking oral iron supplementation.  Review of Systems  Constitutional:  Negative for appetite change, chills, fatigue and fever.  HENT:   Negative for hearing loss and voice change.   Eyes:  Negative for eye problems.  Respiratory:  Negative for chest tightness and cough.   Cardiovascular:  Negative for chest pain.  Gastrointestinal:  Negative for abdominal distention, abdominal pain and blood in stool.  Endocrine: Negative for hot flashes.  Genitourinary:  Negative for difficulty urinating and frequency.   Musculoskeletal:  Negative for arthralgias.  Skin:  Negative for itching and rash.  Neurological:  Negative for extremity weakness.  Hematological:  Negative for adenopathy.  Psychiatric/Behavioral:  Negative for confusion.    Past Medical History:  Diagnosis Date   Allergy    Anemia    Asthma    Hypothyroidism    Thyroid disease    hypothyroid   Past Surgical History:  Procedure Laterality Date   BREAST MASS EXCISION Left 1974   benign   COLONOSCOPY WITH PROPOFOL N/A 10/28/2019   Procedure: COLONOSCOPY WITH PROPOFOL;  Surgeon: Pasty Spillers, MD;  Location: [facility] ENDOSCOPY;  Service: Endoscopy;  Laterality: N/A;   ENDOSCOPIC MUCOSAL RESECTION N/A 11/22/2019   Procedure: ENDOSCOPIC MUCOSAL RESECTION;  Surgeon: Meridee Score Netty Starring., MD;  Location: [facility] ENDOSCOPY;  Service: Gastroenterology;  Laterality: N/A;   ESOPHAGOGASTRODUODENOSCOPY (EGD) WITH PROPOFOL N/A 10/28/2019   Procedure: ESOPHAGOGASTRODUODENOSCOPY (EGD) WITH PROPOFOL;  Surgeon: Pasty Spillers, MD;  Location: [facility] ENDOSCOPY;  Service: Endoscopy;  Laterality: N/A;   ESOPHAGOGASTRODUODENOSCOPY (EGD) WITH PROPOFOL N/A 11/22/2019   Procedure: ESOPHAGOGASTRODUODENOSCOPY (EGD) WITH PROPOFOL;  Surgeon: Meridee Score Netty Starring., MD;  Location: [facility] ENDOSCOPY;  Service: Gastroenterology;  Laterality: N/A;   HEMOSTASIS CLIP PLACEMENT  11/22/2019   Procedure: HEMOSTASIS CLIP PLACEMENT;  Surgeon: Meridee Score Netty Starring., MD;  Location: [facility] ENDOSCOPY;  Service: Gastroenterology;;   HEMOSTASIS CONTROL  11/22/2019   Procedure: HEMOSTASIS CONTROL;  Surgeon: Lemar Lofty., MD;  Location: [facility] ENDOSCOPY;  Service: Gastroenterology;;  epi    nodule removed  1982   from thyroid -benign   PARATHYROIDECTOMY  1982   patient denies this dx   SUBMUCOSAL LIFTING INJECTION  11/22/2019   Procedure: SUBMUCOSAL LIFTING INJECTION;  Surgeon: Lemar Lofty., MD;  Location: [facility] ENDOSCOPY;  Service: Gastroenterology;;   TUBAL LIGATION     WISDOM TOOTH EXTRACTION      Family History  Problem Relation Age of Onset   Cancer Mother        Breast, Paget's removed nipple 46, breast 2001   Colon polyps Mother    Paget's disease of bone Mother    Hypertension Mother    Hyperlipidemia Mother    Heart disease Father  Heart attack Father    Heart disease Maternal Grandfather    Stroke Paternal Grandmother    Colon cancer Neg Hx    Esophageal cancer Neg Hx    Inflammatory bowel disease Neg Hx    Liver disease Neg Hx    Pancreatic cancer Neg Hx    Rectal cancer Neg Hx     Stomach cancer Neg Hx     Social History   Socioeconomic History   Marital status: Married    Spouse name: Not on file   Number of children: Not on file   Years of education: Not on file   Highest education level: Not on file  Occupational History   Occupation: retired  Tobacco Use   Smoking status: Never   Smokeless tobacco: Never  Vaping Use   Vaping Use: Never used  Substance and Sexual Activity   Alcohol use: Never   Drug use: Never   Sexual activity: Not on file  Other Topics Concern   Not on file  Social History Narrative   Not on file   Social Determinants of Health   Financial Resource Strain: Low Risk    Difficulty of Paying Living Expenses: Not hard at all  Food Insecurity: No Food Insecurity   Worried About Charity fundraiser in the Last Year: Never true   [place] in the Last Year: Never true  Transportation Needs: No Transportation Needs   Lack of Transportation (Medical): No   Lack of Transportation (Non-Medical): No  Physical Activity: Inactive   Days of Exercise per Week: 0 days   Minutes of Exercise per Session: 0 min  Stress: Stress Concern Present   Feeling of Stress : To some extent  Social Connections: Not on file  Intimate Partner Violence: Not on file    Current Outpatient Medications on File Prior to Visit  Medication Sig Dispense Refill   fexofenadine (ALLEGRA) 180 MG tablet Take 180 mg by mouth daily as needed for allergies or rhinitis.     fluticasone (FLONASE) 50 MCG/ACT nasal spray Place 2 sprays into both nostrils daily. Use for 4-6 weeks then stop and use seasonally or as needed. 16 g 3   levothyroxine (SYNTHROID) 150 MCG tablet Take by mouth.     montelukast (SINGULAIR) 10 MG tablet Take 1 tablet (10 mg total) by mouth at bedtime. 90 tablet 3   nystatin cream (MYCOSTATIN) Apply 1 application topically 2 (two) times daily. (Patient not taking: Reported on 12/12/2021)     No current facility-administered medications on file  prior to visit.    Allergies  Allergen Reactions   Aspirin Anaphylaxis   Contrast Media [Iodinated Contrast Media] Nausea And Vomiting and Palpitations   Elemental Sulfur Hives and Rash   Ibuprofen Anaphylaxis   Latex Rash    Any latex adhesive, rubber compounds:  examples: ekg leads,etc.   Penicillins Anaphylaxis    Did it involve swelling of the face/tongue/throat, SOB, or low BP? Yes Did it involve sudden or severe rash/hives, skin peeling, or any reaction on the inside of your mouth or nose? No Did you need to seek medical attention at a hospital or doctor's office? Yes When did it last happen?      40 years ago/ Patient was in the hospital when happend If all above answers are NO, may proceed with cephalosporin use.   Rubbing Alcohol [Alcohol] Other (See Comments)    Severe skin warmth   Adhesive [Tape]  Observations/Objective: There were no vitals filed for this visit.  There is no height or weight on file to calculate BMI.   Physical Exam Neurological:     Mental Status: She is alert.    CBC    Component Value Date/Time   WBC 4.2 12/10/2021 1412   RBC 4.94 12/10/2021 1412   HGB 13.4 12/10/2021 1412   HCT 42.2 12/10/2021 1412   PLT 286 12/10/2021 1412   MCV 85.4 12/10/2021 1412   MCH 27.1 12/10/2021 1412   MCHC 31.8 12/10/2021 1412   RDW 14.8 12/10/2021 1412   LYMPHSABS 2.2 12/10/2021 1412   MONOABS 0.3 12/10/2021 1412   EOSABS 0.1 12/10/2021 1412   BASOSABS 0.0 12/10/2021 1412    CMP     Component Value Date/Time   NA 136 12/10/2021 1412   NA 140 09/29/2014 0000   K 4.2 12/10/2021 1412   CL 102 12/10/2021 1412   CO2 26 12/10/2021 1412   GLUCOSE 115 (H) 12/10/2021 1412   BUN 14 12/10/2021 1412   BUN 14 09/29/2014 0000   CREATININE 0.83 12/10/2021 1412   CREATININE 0.81 02/23/2019 0829   CALCIUM 8.9 12/10/2021 1412   PROT 8.4 (H) 12/10/2021 1412   ALBUMIN 3.9 12/10/2021 1412   AST 21 12/10/2021 1412   ALT 12 12/10/2021 1412   ALKPHOS 74  12/10/2021 1412   BILITOT 0.3 12/10/2021 1412   GFRNONAA >60 12/10/2021 1412   GFRNONAA 77 02/23/2019 0829   GFRAA >60 05/08/2020 1409   GFRAA 89 02/23/2019 0829     Assessment and Plan: 1. Elevated total protein   2. Iron deficiency anemia, unspecified iron deficiency anemia type     Labs are reviewed and discussed with patient Iron deficiency anemia has completely resolved.  She has normal hemoglobin, normal iron panel.  No need for oral iron supplementation.  Elevated total protein.  Nonspecific.  Repeat LFT in 4 weeks.  If persistently high, will initiate work-up.  If labs are normal, patient will be discharged.  She agrees with the plan.  I discussed the assessment and treatment plan with the patient. The patient was provided an opportunity to ask questions and all were answered. The patient agreed with the plan and demonstrated an understanding of the instructions.  The patient was advised to call back or seek an in-person evaluation if the symptoms worsen or if the condition fails to improve as anticipated.    Earlie Server, MD 12/12/2021 9:55 PM

## 2022-01-09 ENCOUNTER — Other Ambulatory Visit: Payer: Self-pay

## 2022-01-09 ENCOUNTER — Inpatient Hospital Stay: Payer: Medicare Other | Attending: Oncology

## 2022-01-09 DIAGNOSIS — D509 Iron deficiency anemia, unspecified: Secondary | ICD-10-CM | POA: Insufficient documentation

## 2022-01-09 DIAGNOSIS — R779 Abnormality of plasma protein, unspecified: Secondary | ICD-10-CM | POA: Diagnosis not present

## 2022-01-09 DIAGNOSIS — R778 Other specified abnormalities of plasma proteins: Secondary | ICD-10-CM

## 2022-01-09 LAB — HEPATIC FUNCTION PANEL
ALT: 12 U/L (ref 0–44)
AST: 19 U/L (ref 15–41)
Albumin: 3.7 g/dL (ref 3.5–5.0)
Alkaline Phosphatase: 74 U/L (ref 38–126)
Bilirubin, Direct: 0.1 mg/dL (ref 0.0–0.2)
Indirect Bilirubin: 0.3 mg/dL (ref 0.3–0.9)
Total Bilirubin: 0.4 mg/dL (ref 0.3–1.2)
Total Protein: 7.6 g/dL (ref 6.5–8.1)

## 2022-01-25 ENCOUNTER — Other Ambulatory Visit: Payer: Self-pay | Admitting: Family Medicine

## 2022-01-25 DIAGNOSIS — J0110 Acute frontal sinusitis, unspecified: Secondary | ICD-10-CM

## 2022-01-25 NOTE — Telephone Encounter (Signed)
Requested medication (s) are due for refill today: yes ? ?Requested medication (s) are on the active medication list: yes ? ?Last refill:  10/30/21 #16g/3 ? ?Future visit scheduled: no ? ?Notes to clinic: Unable to refill per protocol, cannot delegate. ? ? ? ?  ?Requested Prescriptions  ?Pending Prescriptions Disp Refills  ? fluticasone (FLONASE) 50 MCG/ACT nasal spray [Pharmacy Med Name: FLUTICASONE PROP 50 MCG SPRAY] 48 mL 1  ?  Sig: Place 2 sprays into both nostrils daily. Use for 4-6 weeks then stop and use seasonally or as needed.  ?  ? Not Delegated - Ear, Nose, and Throat: Nasal Preparations - Corticosteroids Failed - 01/25/2022  1:54 AM  ?  ?  Failed - This refill cannot be delegated  ?  ?  Passed - Valid encounter within last 12 months  ?  Recent Outpatient Visits   ? ?      ? 2 months ago Acute non-recurrent frontal sinusitis  ? [facility] Smitty Cords, DO  ? 5 months ago Annual physical exam  ? [facility] Smitty Cords, DO  ? 1 year ago Pre-diabetes  ? [facility] Althea Charon, Netta Neat, DO  ? 2 years ago Annual physical exam  ? [facility] Smitty Cords, DO  ? 2 years ago Chronic bursitis of right shoulder  ? [facility] Smitty Cords, DO  ? ?  ?  ?Future Appointments   ? ?        ? In 2 months [facility], [place]   ? ?  ? ?  ?  ?  ? ?

## 2022-04-09 ENCOUNTER — Ambulatory Visit: Payer: Medicare Other

## 2022-05-22 ENCOUNTER — Telehealth: Payer: Self-pay

## 2022-05-22 NOTE — Telephone Encounter (Signed)
Copied from CRM [id number]. Topic: Appointment Scheduling - Scheduling Inquiry for Clinic >> May 20, 2022  9:00 AM Lyman Speller wrote: Reason for CRM: Pt would like a call to reschedule her AWV / please advise   I spoke with the patient and appt was scheduled for August 17th @ 10:00am

## 2022-06-21 ENCOUNTER — Ambulatory Visit (INDEPENDENT_AMBULATORY_CARE_PROVIDER_SITE_OTHER): Payer: Medicare Other | Admitting: Podiatry

## 2022-06-21 DIAGNOSIS — R202 Paresthesia of skin: Secondary | ICD-10-CM

## 2022-06-21 NOTE — Progress Notes (Signed)
   HPI: 66 y.o. female presenting today for follow-up evaluation of numbness to the bilateral fifth toes.  Patient states that she notices the numbness more so when she is wearing shoes.  She does not have any associated pain only loss of sensation.  The numbness extends up to the midfoot only.  She wants to make sure that she does not have any underlying conditions or problems.  She presents for further treatment and evaluation  Past Medical History:  Diagnosis Date   Allergy    Anemia    Asthma    Hypothyroidism    Thyroid disease    hypothyroid     Physical Exam: General: The patient is alert and oriented x3 in no acute distress.  Dermatology: Skin is warm, dry and supple bilateral lower extremities. Negative for open lesions or macerations.  Vascular: Palpable pedal pulses bilaterally. No edema or erythema noted. Capillary refill within normal limits.  Neurological: Epicritic and protective threshold grossly intact bilaterally.  There is some paresthesia noted with loss of sensation to the fifth digits bilateral  Musculoskeletal Exam: Range of motion within normal limits to all pedal and ankle joints bilateral. Muscle strength 5/5 in all groups bilateral.  Clinical evidence of a mild bunion deformity noted to the bilateral feet, asymptomatic.  Assessment: 1.  Paresthesia with numbness fifth digits bilateral secondary to closed toe shoes   Plan of Care:  1. Patient evaluated 2.  Today explained to the patient this is most likely associated to her close toed shoes.  She does not have the symptoms as much when she is barefoot. 3.  Reassured the patient that there is nothing alarming to worry about. 4.  Recommend wide fitting shoes that do not aggravate the fifth toes bilaterally 5.  Return to clinic as needed      Felecia Shelling, DPM [facility]  Dr. Felecia Shelling, DPM    [address] [place], [place] [address]                 Office [phone]  Fax [phone]

## 2022-07-29 ENCOUNTER — Ambulatory Visit (INDEPENDENT_AMBULATORY_CARE_PROVIDER_SITE_OTHER): Payer: Medicare Other

## 2022-07-29 DIAGNOSIS — Z599 Problem related to housing and economic circumstances, unspecified: Secondary | ICD-10-CM | POA: Diagnosis not present

## 2022-07-29 DIAGNOSIS — Z0000 Encounter for general adult medical examination without abnormal findings: Secondary | ICD-10-CM | POA: Diagnosis not present

## 2022-07-29 NOTE — Addendum Note (Signed)
Addended by: Wilson Singer on: 07/29/2022 03:42 PM   Modules accepted: Orders

## 2022-07-29 NOTE — Patient Instructions (Signed)

## 2022-07-29 NOTE — Progress Notes (Signed)
I connected with  Haylen Bellotti on 07/29/22 by a audio enabled telemedicine application and verified that I am speaking with the correct person using two identifiers.  Patient Location: Home  Provider Location: Office/Clinic  I discussed the limitations of evaluation and management by telemedicine. The patient expressed understanding and agreed to proceed.   Subjective:   Amanda Pierce is a 66 y.o. female who presents for Medicare Annual (Subsequent) preventive examination.  Review of Systems    Per HPI unless specifically indicated below.  Cardiac Risk Factors include: advanced age (>80men, >71 women);female gender, pre-diabetes, hypertension, and hypercholesterolemia.           Objective:       10/30/2021    3:45 PM 08/17/2021    8:05 AM 04/03/2021   11:36 AM  Vitals with BMI  Height 5' 8.5" 5' 8.5" 5' 8.5"  Weight 222 lbs 222 lbs 10 oz 232 lbs  BMI 33.26 33.35 34.76  Systolic  136   Diastolic  78   Pulse  62     There were no vitals filed for this visit. There is no height or weight on file to calculate BMI.     12/12/2021   12:52 PM 04/03/2021   11:41 AM 11/08/2020    3:02 PM 12/28/2019    1:24 PM 11/22/2019   11:08 AM 10/19/2019   11:23 AM  Advanced Directives  Does Patient Have a Medical Advance Directive? No No No No No No  Would patient like information on creating a medical advance directive?     No - Guardian declined     Current Medications (verified) Outpatient Encounter Medications as of 07/29/2022  Medication Sig   fexofenadine (ALLEGRA) 180 MG tablet Take 180 mg by mouth daily as needed for allergies or rhinitis.   fluticasone (FLONASE) 50 MCG/ACT nasal spray PLACE 2 SPRAYS INTO BOTH NOSTRILS DAILY. USE FOR 4-6 WEEKS THEN STOP AND USE SEASONALLY OR AS NEEDED   montelukast (SINGULAIR) 10 MG tablet Take 1 tablet (10 mg total) by mouth at bedtime. (Patient taking differently: Take 10 mg by mouth as needed.)   nystatin cream (MYCOSTATIN) Apply 1 application   topically as needed.   phentermine 37.5 MG capsule Take 37.5 mg by mouth every morning.   levothyroxine (SYNTHROID) 150 MCG tablet 150 mcg daily before breakfast.   No facility-administered encounter medications on file as of 07/29/2022.    Allergies (verified) Aspirin, Contrast media [iodinated contrast media], Elemental sulfur, Ibuprofen, Latex, Penicillins, Rubbing alcohol [alcohol], Adhesive [tape], Medical adhesive remover, and Sulfa antibiotics   History: Past Medical History:  Diagnosis Date   Allergy    Anemia    Asthma    Hypothyroidism    Thyroid disease    hypothyroid   Past Surgical History:  Procedure Laterality Date   BREAST MASS EXCISION Left 1974   benign   COLONOSCOPY WITH PROPOFOL N/A 10/28/2019   Procedure: COLONOSCOPY WITH PROPOFOL;  Surgeon: Pasty Spillers, MD;  Location: [facility] ENDOSCOPY;  Service: Endoscopy;  Laterality: N/A;   ENDOSCOPIC MUCOSAL RESECTION N/A 11/22/2019   Procedure: ENDOSCOPIC MUCOSAL RESECTION;  Surgeon: Meridee Score Netty Starring., MD;  Location: [facility] ENDOSCOPY;  Service: Gastroenterology;  Laterality: N/A;   ESOPHAGOGASTRODUODENOSCOPY (EGD) WITH PROPOFOL N/A 10/28/2019   Procedure: ESOPHAGOGASTRODUODENOSCOPY (EGD) WITH PROPOFOL;  Surgeon: Pasty Spillers, MD;  Location: [facility] ENDOSCOPY;  Service: Endoscopy;  Laterality: N/A;   ESOPHAGOGASTRODUODENOSCOPY (EGD) WITH PROPOFOL N/A 11/22/2019   Procedure: ESOPHAGOGASTRODUODENOSCOPY (EGD) WITH PROPOFOL;  Surgeon: Lemar Lofty., MD;  Location: [facility] ENDOSCOPY;  Service: Gastroenterology;  Laterality: N/A;   HEMOSTASIS CLIP PLACEMENT  11/22/2019   Procedure: HEMOSTASIS CLIP PLACEMENT;  Surgeon: Rush Landmark Telford Nab., MD;  Location: [facility];  Service: Gastroenterology;;   HEMOSTASIS CONTROL  11/22/2019   Procedure: HEMOSTASIS CONTROL;  Surgeon: Irving Copas., MD;  Location: [facility];  Service: Gastroenterology;;  epi    nodule removed  1982   from thyroid -benign    [place]   patient denies this dx   SUBMUCOSAL LIFTING INJECTION  11/22/2019   Procedure: SUBMUCOSAL LIFTING INJECTION;  Surgeon: Irving Copas., MD;  Location: [facility] ENDOSCOPY;  Service: Gastroenterology;;   TUBAL LIGATION     WISDOM TOOTH EXTRACTION     Family History  Problem Relation Age of Onset   Cancer Mother        Breast, Paget's removed nipple 32, breast 2001   Colon polyps Mother    Paget's disease of bone Mother    Hypertension Mother    Hyperlipidemia Mother    Heart disease Father    Heart attack Father    Heart disease Maternal Grandfather    Stroke Paternal Grandmother    Colon cancer Neg Hx    Esophageal cancer Neg Hx    Inflammatory bowel disease Neg Hx    Liver disease Neg Hx    Pancreatic cancer Neg Hx    Rectal cancer Neg Hx    Stomach cancer Neg Hx    Social History   Socioeconomic History   Marital status: Widowed    Spouse name: Not on file   Number of children: 4   Years of education: Not on file   Highest education level: Not on file  Occupational History   Occupation: retired  Tobacco Use   Smoking status: Never   Smokeless tobacco: Never  Vaping Use   Vaping Use: Never used  Substance and Sexual Activity   Alcohol use: Never   Drug use: Never   Sexual activity: Not on file  Other Topics Concern   Not on file  Social History Narrative   Not on file   Social Determinants of Health   Financial Resource Strain: High Risk (07/29/2022)   Overall Financial Resource Strain (CARDIA)    Difficulty of Paying Living Expenses: Hard  Food Insecurity: No Food Insecurity (07/29/2022)   Hunger Vital Sign    Worried About Running Out of Food in the Last Year: Never true    Ran Out of Food in the Last Year: Never true  Transportation Needs: No Transportation Needs (07/29/2022)   PRAPARE - Hydrologist (Medical): No    Lack of Transportation (Non-Medical): No  Physical Activity: Inactive (04/03/2021)    Exercise Vital Sign    Days of Exercise per Week: 0 days    Minutes of Exercise per Session: 0 min  Stress: Stress Concern Present (04/03/2021)   [place]    Feeling of Stress : To some extent  Social Connections: Socially Isolated (07/29/2022)   Social Connection and Isolation Panel [NHANES]    Frequency of Communication with Friends and Family: More than three times a week    Frequency of Social Gatherings with Friends and Family: Twice a week    Attends Religious Services: Never    Marine scientist or Organizations: No    Attends Archivist Meetings: Never    Marital Status: Widowed    Tobacco Counseling  Counseling given: No Does not apply, no history of smoking.  Clinical Intake:  Pre-visit preparation completed: No  Pain : No/denies pain     Nutritional Status: BMI > 30  Obese Nutritional Risks: Unintentional weight gain  How often do you need to have someone help you when you read instructions, pamphlets, or other written materials from your doctor or pharmacy?: 1 - Never  Diabetic?No        Information entered by :: Laurel Dimmer, CMA   Activities of Daily Living    07/29/2022    2:51 PM  In your present state of health, do you have any difficulty performing the following activities:  Hearing? 0  Vision? 1  Difficulty concentrating or making decisions? 0  Walking or climbing stairs? 0  Dressing or bathing? 0  Doing errands, shopping? 0    Patient Care Team: Smitty Cords, DO as PCP - General (Family Medicine) Rickard Patience, MD as Consulting Physician (Oncology)  Indicate any recent Medical Services you may have received from other than [facility] providers in the past year (date may be approximate). No hospitalization in the past 12 months.    Assessment:   This is a routine wellness examination for [place].  Hearing/Vision screen Denies any vision issues,Overdue for  an annual eye exam.   Denies any hearing issues.   Dietary issues and exercise activities discussed: Current Exercise Habits: Structured exercise class, Type of exercise: Other - see comments, Time (Minutes): 10, Frequency (Times/Week): 2, Weekly Exercise (Minutes/Week): 20, Intensity: Mild, Exercise limited by: None identified   Goals Addressed   None   Depression Screen    07/29/2022    2:52 PM 04/03/2021   11:42 AM 04/25/2020    1:53 PM 10/13/2019    2:29 PM 04/05/2019    2:07 PM 02/26/2019    1:18 PM 01/04/2019    4:06 PM  PHQ 2/9 Scores  PHQ - 2 Score 6 3 0 0 3 0 0  PHQ- 9 Score 14 6   16       Fall Risk    07/29/2022    2:51 PM 04/03/2021   11:41 AM 04/25/2020    1:53 PM 10/13/2019    2:29 PM 04/05/2019    2:07 PM  Fall Risk   Falls in the past year? 0 0 0 0 0  Number falls in past yr: 0  0 0   Injury with Fall? 0  0 0   Risk for fall due to : No Fall Risks No Fall Risks     Follow up Falls evaluation completed Falls evaluation completed;Education provided;Falls prevention discussed Falls evaluation completed Falls evaluation completed Falls evaluation completed    FALL RISK PREVENTION PERTAINING TO THE HOME:  Any stairs in or around the home? Yes  If so, are there any without handrails? No  Home free of loose throw rugs in walkways, pet beds, electrical cords, etc? Yes  Adequate lighting in your home to reduce risk of falls? Yes   ASSISTIVE DEVICES UTILIZED TO PREVENT FALLS:  Life alert? No  Use of a cane, walker or w/c? No  Grab bars in the bathroom? No  Shower chair or bench in shower? No  Elevated toilet seat or a handicapped toilet? No   TIMED UP AND GO:  Was the test performed?  unable to preform, vital appt .    Cognitive Function:        07/29/2022    2:57 PM 04/03/2021   11:45 AM  6CIT Screen  What Year? 0 points 0 points  What month? 0 points 0 points  What time? 0 points 0 points  Count back from 20 0 points 0 points  Months in reverse 0 points 0  points  Repeat phrase 0 points 0 points  Total Score 0 points 0 points    Immunizations Immunization History  Administered Date(s) Administered   PFIZER(Purple Top)SARS-COV-2 Vaccination 01/08/2020, 01/29/2020, 10/24/2020   PNEUMOCOCCAL CONJUGATE-20 08/17/2021   Tdap 08/28/2018    TDAP status: Up to date  Flu Vaccine status: Declined, Education has been provided regarding the importance of this vaccine but patient still declined. Advised may receive this vaccine at local pharmacy or Health Dept. Aware to provide a copy of the vaccination record if obtained from local pharmacy or Health Dept. Verbalized acceptance and understanding.  Pneumococcal vaccine status: Up to date  Covid-19 vaccine status: Completed vaccines  Qualifies for Shingles Vaccine? Yes   Zostavax completed No   Shingrix Completed?: No.    Education has been provided regarding the importance of this vaccine. Patient has been advised to call insurance company to determine out of pocket expense if they have not yet received this vaccine. Advised may also receive vaccine at local pharmacy or Health Dept. Verbalized acceptance and understanding.  Screening Tests Health Maintenance  Topic Date Due   Zoster Vaccines- Shingrix (1 of 2) Never done   COVID-19 Vaccine (4 - Pfizer series) 12/19/2020   INFLUENZA VACCINE  Never done   MAMMOGRAM  10/20/2023   TETANUS/TDAP  08/28/2028   COLONOSCOPY (Pts 45-55yrs Insurance coverage will need to be confirmed)  10/27/2029   Pneumonia Vaccine 64+ Years old  Completed   DEXA SCAN  Completed   Hepatitis C Screening  Completed   HPV VACCINES  Aged Out    Health Maintenance  Health Maintenance Due  Topic Date Due   Zoster Vaccines- Shingrix (1 of 2) Never done   COVID-19 Vaccine (4 - Pfizer series) 12/19/2020   INFLUENZA VACCINE  Never done    Colorectal cancer screening: Type of screening: Colonoscopy. Completed 10/28/2019. Repeat every 10 years  Mammogram status:  Completed 12/23/20222. Repeat every year  DEXA scan: completed 09/02/2018   Lung Cancer Screening: (Low Dose CT Chest recommended if Age 82-80 years, 30 pack-year currently smoking OR have quit w/in 15years.) does not qualify.   Additional Screening:  Hepatitis C Screening: does qualify; Completed 03/06/2011  Vision Screening: Recommended annual ophthalmology exams for early detection of glaucoma and other disorders of the eye. Is the patient up to date with their annual eye exam?  No Who is the provider or what is the name of the office in which the patient attends annual eye exams?  If pt is not established with a provider, would they like to be referred to a provider to establish care? No .   Dental Screening: Recommended annual dental exams for proper oral hygiene  Community Resource Referral / Chronic Care Management: CRR required this visit?  Yes   CCM required this visit?  Yes      Plan:     I have personally reviewed and noted the following in the patient's chart:   Medical and social history Use of alcohol, tobacco or illicit drugs  Current medications and supplements including opioid prescriptions. Patient is not currently taking opioid prescriptions. Functional ability and status Nutritional status Physical activity Advanced directives List of other physicians Hospitalizations, surgeries, and ER visits in previous 12 months Vitals Screenings to include  cognitive, depression, and falls Referrals and appointments   Ms. Veto KempsRudd , Thank you for taking time to come for your Medicare Wellness Visit. I appreciate your ongoing commitment to your health goals. Please review the following plan we discussed and let me know if I can assist you in the future.   These are the goals we discussed:  Goals      Patient Stated     04/03/2021, wants to 200-210 pounds        This is a list of the screening recommended for you and due dates:  Health Maintenance  Topic Date  Due   Zoster (Shingles) Vaccine (1 of 2) Never done   COVID-19 Vaccine (4 - Pfizer series) 12/19/2020   Flu Shot  Never done   Mammogram  10/20/2023   Tetanus Vaccine  08/28/2028   Colon Cancer Screening  10/27/2029   Pneumonia Vaccine  Completed   DEXA scan (bone density measurement)  Completed   Hepatitis C Screening: USPSTF Recommendation to screen - Ages 5118-79 yo.  Completed   HPV Vaccine  Aged [address]Out     Sharanda Shinault L Erhard Senske, [place]MA   07/29/2022   Nurse Notes: Approximately 40 minute Non-Face-to-Face Visit

## 2022-07-30 ENCOUNTER — Ambulatory Visit: Payer: Self-pay

## 2022-07-30 NOTE — Patient Outreach (Signed)
  Care Coordination   Initial Visit Note   07/30/2022 Name: Caryn Gienger MRN: [id number] DOB: 1956/07/30  Chianna Spirito is a 66 y.o. year old female who sees Olin Hauser, DO for primary care. I spoke with  Gabriela Eves by phone today.  What matters to the patients health and wellness today?  To learn more about resources    Goals Addressed             This Visit's Progress    COMPLETED: Care Coordination Activities       Care Coordination Interventions: Referral received for SDoH needs including mortgage, utilities, and food Discussed the patients husband passed away earlier this year and the patients income is now lower than it used to be Reviewed the patient received social security, a disability pension, [place] spousal support, and some of patients social security income Education provided on assistance programs - patient is over the income limit Discussed the patient is doing okay at this time financially she is just looking for possible options in the future if/when needed Education provided on [employer] for the patient to contact if ever needed for resource guidance Education on Hospice Grief Therapy programs advising the patient to contact her primary care provider if she is ever in need of a referral in order to process her grief No follow up needed at this time         SDOH assessments and interventions completed:  Yes  SDOH Interventions Today    Flowsheet Row Most Recent Value  SDOH Interventions   Food Insecurity Interventions Intervention Not Indicated  Housing Interventions Intervention Not Indicated  Transportation Interventions Intervention Not Indicated  Utilities Interventions Intervention Not Indicated  Financial Strain Interventions Intervention Not Indicated  [pt adjusting to life without huband,  managing billsok]        Care Coordination Interventions Activated:  Yes  Care Coordination Interventions:  Yes, provided   Follow up  plan: No further intervention required.   Encounter Outcome:  Pt. Visit Completed   Daneen Schick, BSW, CDP Social Worker, Certified Dementia Practitioner [place] [facility]  Care Coordination [phone]

## 2022-07-30 NOTE — Patient Instructions (Signed)
Visit Information  Thank you for taking time to visit with me today. Please don't hesitate to contact me if I can be of assistance to you.   Following are the goals we discussed today:   Goals Addressed             This Visit's Progress    COMPLETED: Care Coordination Activities       Care Coordination Interventions: Referral received for SDoH needs including mortgage, utilities, and food Discussed the patients husband passed away earlier this year and the patients income is now lower than it used to be Reviewed the patient received social security, a disability pension, [place] spousal support, and some of patients social security income Education provided on assistance programs - patient is over the income limit Discussed the patient is doing okay at this time financially she is just looking for possible options in the future if/when needed Education provided on [place] for the patient to contact if ever needed for resource guidance Education on Hospice Grief Therapy programs advising the patient to contact her primary care provider if she is ever in need of a referral in order to process her grief No follow up needed at this time          If you are experiencing a [place] or [place] or need someone to talk to, please call 1-800-273-TALK (toll free, 24 hour hotline)  Patient verbalizes understanding of instructions and care plan provided today and agrees to view in [place]. Active MyChart status and patient understanding of how to access instructions and care plan via MyChart confirmed with patient.     No further follow up required: Please contact your primary care provider as needed.  Daneen Schick, BSW, CDP Social Worker, Certified Dementia Practitioner [place] [facility]  Care Coordination [phone]

## 2022-08-05 ENCOUNTER — Encounter: Payer: Self-pay | Admitting: Family Medicine

## 2022-08-05 ENCOUNTER — Ambulatory Visit (INDEPENDENT_AMBULATORY_CARE_PROVIDER_SITE_OTHER): Payer: Medicare Other | Admitting: Family Medicine

## 2022-08-05 VITALS — BP 147/84 | HR 78 | Ht 68.5 in | Wt 222.0 lb

## 2022-08-05 DIAGNOSIS — F4321 Adjustment disorder with depressed mood: Secondary | ICD-10-CM | POA: Diagnosis not present

## 2022-08-05 NOTE — Patient Instructions (Addendum)
Thank you for coming to the office today.  Keep up the good work overall  Here is a referral list if you are interested to review it any further  Recommend return to me for yearly in 4-6 months.   These offices have both PSYCHIATRY doctors and [place] (Virtual Available) [place] [address] [place], [place] [address] Phone: [phone]  [facility] Address: [address], [place], [place] [address] [email or web address] Phone: [phone]  [facility] [place] ([place] [facility]) Address: [address], [place], [place] [address] Hours: 8:30AM-5PM Phone: [phone]  [facility] (Adult, [place], Geriatric, Counseling) [address], [place], [place] [address] Phone: [phone] Fax: [phone]  [facility] [place], [place] [address] Phone: [phone]  [facility] (All ages) [address], [place], [id number] Phone: [phone] (Option 1) [email or web address]  ----------------------------------------------------------------- THERAPIST ONLY  (No Psychiatry)  [facility] [address], [place] [address] [place] P: [phone]  [facility]) [facility], [facility] [address] [place], [place] [address] [phone]  [place].   Address: [address] [place], [place] [address] Hours: Open today  9AM-7PM Phone: [phone]  [facility] [address] Address: [address] [place], [place],  [address] Phone: [phone]    Please schedule a Follow-up Appointment to: Return in about 6 months (around 02/04/2023) for 6 month Annual Physical AM apt fasting lab after.  If you have any other questions or concerns, please feel free  to call the office or send a message through [place]. You may also schedule an earlier appointment if necessary.  Additionally, you may be receiving a survey about your experience at our office within a few days to 1 week by e-mail or mail. We value your feedback.  Nobie Putnam, DO [place]

## 2022-08-05 NOTE — Progress Notes (Signed)
Subjective:    Patient ID: Amanda Pierce, female    DOB: 1956/05/27, 66 y.o.   MRN: [id number]  Amanda Pierce is a 66 y.o. female presenting on 08/05/2022 for Anxiety and Depression   HPI  Adjustment Disorder with depressed mood Husband passed 186 days ago, he was a Education officer, environmental. She currently is relying on her children and religious support system right now to manage. She did pursue grief counseling virtually, and she does not believe this was as helpful since it is more of a group therapy setting, she feels confident overall in his passing and is able to manage this mentally over time. She admits to having depressed and down days and good and happy days. She has had more symptoms of anger lately past few weeks with her in law family not coming to visit her and variety of issues with them. She is processing things in her way and feels safe and comfortable and she is not interested in medications or other therapy but would take referral info for therapist in future if changes mind     08/05/2022    4:08 PM 07/29/2022    2:52 PM 04/03/2021   11:42 AM  Depression screen PHQ 2/9  Decreased Interest 1 3 0  Down, Depressed, Hopeless 0 3 3  PHQ - 2 Score 1 6 3   Altered sleeping 2 3 0  Tired, decreased energy 1 2 3   Change in appetite 1 2 0  Feeling bad or failure about yourself  0 1 0  Trouble concentrating 1 0 0  Moving slowly or fidgety/restless 0 0 0  Suicidal thoughts 0 0 0  PHQ-9 Score 6 14 6   Difficult doing work/chores Not difficult at all Very difficult Somewhat difficult    Social History   Tobacco Use   Smoking status: Never   Smokeless tobacco: Never  Vaping Use   Vaping Use: Never used  Substance Use Topics   Alcohol use: Never   Drug use: Never    Review of Systems Per HPI unless specifically indicated above     Objective:    BP (!) 163/77   Pulse 78   Ht 5' 8.5" (1.74 m)   Wt 222 lb (100.7 kg)   LMP  (LMP Unknown)   SpO2 100%   BMI 33.26 kg/m   Wt Readings from  Last 3 Encounters:  08/05/22 222 lb (100.7 kg)  10/30/21 222 lb (100.7 kg)  08/17/21 222 lb 9.6 oz (101 kg)    Physical Exam Vitals and nursing note reviewed.  Constitutional:      General: She is not in acute distress.    Appearance: Normal appearance. She is well-developed. She is not diaphoretic.     Comments: Well-appearing, comfortable, cooperative  HENT:     Head: Normocephalic and atraumatic.  Eyes:     General:        Right eye: No discharge.        Left eye: No discharge.     Conjunctiva/sclera: Conjunctivae normal.  Cardiovascular:     Rate and Rhythm: Normal rate.  Pulmonary:     Effort: Pulmonary effort is normal.  Skin:    General: Skin is warm and dry.     Findings: No erythema or rash.  Neurological:     Mental Status: She is alert and oriented to person, place, and time.  Psychiatric:        Mood and Affect: Mood normal.        Behavior: Behavior  normal.        Thought Content: Thought content normal.     Comments: Well groomed, good eye contact, normal speech and thoughts    Results for orders placed or performed in visit on 01/09/22  Hepatic function panel  Result Value Ref Range   Total Protein 7.6 6.5 - 8.1 g/dL   Albumin 3.7 3.5 - 5.0 g/dL   AST 19 15 - 41 U/L   ALT 12 0 - 44 U/L   Alkaline Phosphatase 74 38 - 126 U/L   Total Bilirubin 0.4 0.3 - 1.2 mg/dL   Bilirubin, Direct 0.1 0.0 - 0.2 mg/dL   Indirect Bilirubin 0.3 0.3 - 0.9 mg/dL      Assessment & Plan:   Problem List Items Addressed This Visit   None Visit Diagnoses     Adjustment disorder with depressed mood    -  Primary       Discussed her bereavement and grief process with adjustment disorder depressed mood symptoms Overall she seems to be processing the loss of her husband quite normally and she admits various stressors with family and in laws and those relationships at this time causing significant stressors and anger/agitation. Overall she is managing her mental health well,  she has support system with her children and religion She is not interested in rx medication for mood at this time. I gave her AVS List of mental health resources if wants to pursue therapy (one on one, not group) in future Reassurance given to her today and follow up info  BP improved on repeat check  No orders of the defined types were placed in this encounter.     Follow up plan: Return in about 6 months (around 02/04/2023) for 6 month Annual Physical AM apt fasting lab after.   Nobie Putnam, [facility] 08/05/2022, 4:09 PM

## 2022-08-09 ENCOUNTER — Other Ambulatory Visit: Payer: Self-pay | Admitting: Family Medicine

## 2022-08-09 DIAGNOSIS — J0110 Acute frontal sinusitis, unspecified: Secondary | ICD-10-CM

## 2022-08-09 NOTE — Telephone Encounter (Signed)
Requested Prescriptions  Pending Prescriptions Disp Refills  . fluticasone (FLONASE) 50 MCG/ACT nasal spray [Pharmacy Med Name: FLUTICASONE PROP 50 MCG SPRAY] 48 mL 1    Sig: PLACE 2 SPRAYS INTO BOTH NOSTRILS DAILY. USE FOR 4-6 WEEKS THEN STOP AND USE SEASONALLY OR AS NEEDED     Ear, Nose, and Throat: Nasal Preparations - Corticosteroids Passed - 08/09/2022  1:28 AM      Passed - Valid encounter within last 12 months    Recent Outpatient Visits          4 days ago Adjustment disorder with depressed mood   [place], DO   9 months ago Acute non-recurrent frontal sinusitis   [facility] Olin Hauser, DO   11 months ago Annual physical exam   [facility] Olin Hauser, DO   2 years ago [place], DO   2 years ago Annual physical exam   [facility] [place], Devonne Doughty, DO

## 2022-10-12 ENCOUNTER — Other Ambulatory Visit: Payer: Self-pay | Admitting: Family Medicine

## 2022-10-12 DIAGNOSIS — J3089 Other allergic rhinitis: Secondary | ICD-10-CM

## 2022-10-14 NOTE — Telephone Encounter (Signed)
Requested Prescriptions  Pending Prescriptions Disp Refills   montelukast (SINGULAIR) 10 MG tablet [Pharmacy Med Name: MONTELUKAST SOD 10 MG TABLET] 90 tablet 3    Sig: TAKE 1 TABLET BY MOUTH EVERYDAY AT BEDTIME     Pulmonology:  Leukotriene Inhibitors Passed - 10/12/2022  8:00 AM      Passed - Valid encounter within last 12 months    Recent Outpatient Visits           2 months ago Adjustment disorder with depressed mood   [facility] Smitty Cords, DO   11 months ago Acute non-recurrent frontal sinusitis   [facility] [place], Netta Neat, DO   1 year ago Annual physical exam   [facility] Smitty Cords, DO   2 years ago Pre-diabetes   [facility] Althea Charon, Netta Neat, DO   3 years ago Annual physical exam   [facility] [place], Netta Neat, DO

## 2022-11-08 ENCOUNTER — Ambulatory Visit
Admission: EM | Admit: 2022-11-08 | Discharge: 2022-11-08 | Disposition: A | Discharge status: Home / Self Care | Payer: Medicare Other | Attending: Emergency Medicine | Admitting: Emergency Medicine

## 2022-11-08 DIAGNOSIS — Z1152 Encounter for screening for COVID-19: Secondary | ICD-10-CM | POA: Diagnosis not present

## 2022-11-08 DIAGNOSIS — R059 Cough, unspecified: Secondary | ICD-10-CM | POA: Diagnosis present

## 2022-11-08 DIAGNOSIS — J069 Acute upper respiratory infection, unspecified: Secondary | ICD-10-CM | POA: Insufficient documentation

## 2022-11-08 LAB — SARS CORONAVIRUS 2 BY RT PCR: SARS Coronavirus 2 by RT PCR: NEGATIVE

## 2022-11-08 MED ORDER — BENZONATATE 100 MG PO CAPS: 200.0000 mg | ORAL_CAPSULE | Freq: Three times a day (TID) | ORAL | 0 refills | Status: DC

## 2022-11-08 MED ORDER — IPRATROPIUM BROMIDE 0.06 % NA SOLN: 2.0000 | Freq: Four times a day (QID) | NASAL | 12 refills | Status: DC

## 2022-11-08 MED ORDER — PROMETHAZINE-DM 6.25-15 MG/5ML PO SYRP: 5.0000 mL | ORAL_SOLUTION | Freq: Four times a day (QID) | ORAL | 0 refills | Status: DC | PRN

## 2022-11-08 NOTE — ED Provider Notes (Signed)
[facility]    CSN: [id number] Arrival date & time: 11/08/22  1609      History   Chief Complaint Chief Complaint  Patient presents with   Abdominal Pain   Sore Throat    HPI Berenis Corter is a 67 y.o. female.   HPI  67 year old female here for evaluation of respiratory symptoms.  The patient reports that she has been experiencing nasal congestion with a scratchy throat, sneezing, loss of voice, body aches, and a nonproductive cough for the last 2 days.  She did have a 2-day episode of abdominal pain 5 days ago that resolved completely after she had a bowel movement.  She does have a history of constipation.  She is not having any pain now.  She denies any fever, shortness breath, or wheezing.  Past Medical History:  Diagnosis Date   Allergy    Anemia    Asthma    Hypothyroidism    Thyroid disease    hypothyroid    Patient Active Problem List   Diagnosis Date Noted   Sprain of shoulder and upper arm 05/09/2020   Impingement syndrome of shoulder region 05/09/2020   Stomach irritation    Gastric polyp    Special screening for malignant neoplasms, colon    Pain in joint of right shoulder 04/15/2019   Iron deficiency anemia 02/26/2019   Vitamin D deficiency 01/04/2019   Elevated BP without diagnosis of hypertension 12/07/2018   Osteopenia of spine 09/17/2018   Hiatal hernia 08/28/2018   Obesity (BMI 30.0-34.9) 05/30/2018   Gastroesophageal reflux disease without esophagitis 05/30/2018   Pre-diabetes 05/30/2018   Pure hypercholesterolemia 05/30/2018   Hypothyroidism (acquired) 05/29/2018   Suspected sleep apnea 05/29/2018    Past Surgical History:  Procedure Laterality Date   BREAST MASS EXCISION Left 1974   benign   COLONOSCOPY WITH PROPOFOL N/A 10/28/2019   Procedure: COLONOSCOPY WITH PROPOFOL;  Surgeon: Virgel Manifold, MD;  Location: [facility];  Service: Endoscopy;  Laterality: N/A;   ENDOSCOPIC MUCOSAL RESECTION N/A 11/22/2019    Procedure: ENDOSCOPIC MUCOSAL RESECTION;  Surgeon: Rush Landmark Telford Nab., MD;  Location: [place];  Service: Gastroenterology;  Laterality: N/A;   ESOPHAGOGASTRODUODENOSCOPY (EGD) WITH PROPOFOL N/A 10/28/2019   Procedure: ESOPHAGOGASTRODUODENOSCOPY (EGD) WITH PROPOFOL;  Surgeon: Virgel Manifold, MD;  Location: [facility];  Service: Endoscopy;  Laterality: N/A;   ESOPHAGOGASTRODUODENOSCOPY (EGD) WITH PROPOFOL N/A 11/22/2019   Procedure: ESOPHAGOGASTRODUODENOSCOPY (EGD) WITH PROPOFOL;  Surgeon: Rush Landmark Telford Nab., MD;  Location: [place];  Service: Gastroenterology;  Laterality: N/A;   HEMOSTASIS CLIP PLACEMENT  11/22/2019   Procedure: HEMOSTASIS CLIP PLACEMENT;  Surgeon: Rush Landmark Telford Nab., MD;  Location: [place];  Service: Gastroenterology;;   HEMOSTASIS CONTROL  11/22/2019   Procedure: HEMOSTASIS CONTROL;  Surgeon: Irving Copas., MD;  Location: [place];  Service: Gastroenterology;;  epi    nodule removed  1982   from thyroid -benign   [place]   patient denies this dx   SUBMUCOSAL LIFTING INJECTION  11/22/2019   Procedure: SUBMUCOSAL LIFTING INJECTION;  Surgeon: Irving Copas., MD;  Location: [place];  Service: Gastroenterology;;   TUBAL LIGATION     WISDOM TOOTH EXTRACTION      OB History   No obstetric history on file.      Home Medications    Prior to Admission medications   Medication Sig Start Date End Date Taking? Authorizing Provider  benzonatate (TESSALON) 100 MG capsule Take 2 capsules (200 mg total) by mouth every  8 (eight) hours. 11/08/22  Yes Margarette Canada, NP  fexofenadine (ALLEGRA) 180 MG tablet Take 180 mg by mouth daily as needed for allergies or rhinitis.   Yes [provider]  fluticasone (FLONASE) 50 MCG/ACT nasal spray PLACE 2 SPRAYS INTO BOTH NOSTRILS DAILY. USE FOR 4-6 WEEKS THEN STOP AND USE SEASONALLY OR AS NEEDED 08/09/22  Yes Karamalegos, Alexander J, DO  ipratropium (ATROVENT)  0.06 % nasal spray Place 2 sprays into both nostrils 4 (four) times daily. 11/08/22  Yes Margarette Canada, NP  montelukast (SINGULAIR) 10 MG tablet TAKE 1 TABLET BY MOUTH EVERYDAY AT BEDTIME 10/14/22  Yes Karamalegos, Devonne Doughty, DO  nystatin cream (MYCOSTATIN) Apply 1 application  topically as needed. 10/13/21  Yes [provider]  phentermine 37.5 MG capsule Take 37.5 mg by mouth every morning.   Yes [provider]  promethazine-dextromethorphan (PROMETHAZINE-DM) 6.25-15 MG/5ML syrup Take 5 mLs by mouth 4 (four) times daily as needed. 11/08/22  Yes Margarette Canada, NP  levothyroxine (SYNTHROID) 150 MCG tablet 150 mcg daily before breakfast. 04/01/21 04/01/22  [provider]    Family History Family History  Problem Relation Age of Onset   Cancer Mother        Breast, Paget's removed nipple 58, breast 2001   Colon polyps Mother    Paget's disease of bone Mother    Hypertension Mother    Hyperlipidemia Mother    Heart disease Father    Heart attack Father    Heart disease Maternal Grandfather    Stroke Paternal Grandmother    Colon cancer Neg Hx    Esophageal cancer Neg Hx    Inflammatory bowel disease Neg Hx    Liver disease Neg Hx    Pancreatic cancer Neg Hx    Rectal cancer Neg Hx    Stomach cancer Neg Hx     Social History Social History   Tobacco Use   Smoking status: Never   Smokeless tobacco: Never  Vaping Use   Vaping Use: Never used  Substance Use Topics   Alcohol use: Never   Drug use: Never     Allergies   Aspirin, Contrast media [iodinated contrast media], Elemental sulfur, Ibuprofen, Latex, Penicillins, Rubbing alcohol [alcohol], Adhesive [tape], Medical adhesive remover, and Sulfa antibiotics   Review of Systems Review of Systems  Constitutional:  Negative for fever.  HENT:  Positive for congestion, rhinorrhea, sneezing and sore throat.   Respiratory:  Positive for cough. Negative for shortness of breath and wheezing.       Physical Exam Triage Vital Signs ED Triage Vitals  Enc Vitals Group     BP 11/08/22 1648 (!) 173/96     Pulse Rate 11/08/22 1648 64     Resp 11/08/22 1648 18     Temp 11/08/22 1648 98.4 F (36.9 C)     Temp Source 11/08/22 1648 Oral     SpO2 11/08/22 1648 97 %     Weight 11/08/22 1646 219 lb (99.3 kg)     Height 11/08/22 1646 5\' 8"  (1.727 m)     Head Circumference --      Peak Flow --      Pain Score 11/08/22 1646 7     Pain Loc --      Pain Edu? --      Excl. in [place]? --    No data found.  Updated Vital Signs BP (!) 173/96 (BP Location: Left Arm)   Pulse 64   Temp 98.4 F (36.9 C) (  Oral)   Resp 18   Ht 5\' 8"  (1.727 m)   Wt 219 lb (99.3 kg)   LMP  (LMP Unknown)   SpO2 97%   BMI 33.30 kg/m   Visual Acuity Right Eye Distance:   Left Eye Distance:   Bilateral Distance:    Right Eye Near:   Left Eye Near:    Bilateral Near:     Physical Exam Vitals and nursing note reviewed.  Constitutional:      Appearance: Normal appearance. She is not ill-appearing.  HENT:     Head: Normocephalic and atraumatic.     Right Ear: Tympanic membrane, ear canal and external ear normal. There is no impacted cerumen.     Left Ear: Tympanic membrane, ear canal and external ear normal. There is no impacted cerumen.     Nose: Congestion and rhinorrhea present.     Comments: Nasal mucosa is erythematous and mildly edematous with clear discharge in both nares.    Mouth/Throat:     Mouth: Mucous membranes are moist.     Pharynx: Oropharynx is clear. Posterior oropharyngeal erythema present.     Comments: Posterior oropharynx has erythema and mild injection clear postnasal drip. Cardiovascular:     Rate and Rhythm: Normal rate and regular rhythm.     Pulses: Normal pulses.     Heart sounds: Normal heart sounds. No murmur heard.    No friction rub. No gallop.  Pulmonary:     Effort: Pulmonary effort is normal.     Breath sounds: Normal breath sounds. No wheezing, rhonchi or  rales.  Musculoskeletal:     Cervical back: Normal range of motion and neck supple.  Lymphadenopathy:     Cervical: No cervical adenopathy.  Skin:    General: Skin is warm and dry.     Capillary Refill: Capillary refill takes less than 2 seconds.     Findings: No erythema or rash.  Neurological:     General: No focal deficit present.     Mental Status: She is alert and oriented to person, place, and time.  Psychiatric:        Mood and Affect: Mood normal.        Behavior: Behavior normal.        Thought Content: Thought content normal.        Judgment: Judgment normal.      UC Treatments / Results  Labs (all labs ordered are listed, but only abnormal results are displayed) Labs Reviewed  SARS CORONAVIRUS 2 BY RT PCR    EKG   Radiology No results found.  Procedures Procedures (including critical care time)  Medications Ordered in UC Medications - No data to display  Initial Impression / Assessment and Plan / UC Course  I have reviewed the triage vital signs and the nursing notes.  Pertinent labs & imaging results that were available during my care of the patient were reviewed by me and considered in my medical decision making (see chart for details).   Patient is a pleasant, nontoxic-appearing 67 year old female here for evaluation of 3 days worth of respiratory symptoms as outlined HPI above.  She does have inflammation of her nasal mucosa as well as her posterior oropharynx with clear rhinorrhea and clear postnasal drip.  No cervical lymphadenopathy on exam.  Cardiopulmonary exam feels clear lung sounds in all fields.  She is able to speak in full sentences without dyspnea or tachypnea.  She states that her symptoms began after she went to the  [place] and she states that she has not been out of the house anywhere else other than that location.  As mentioned in the HPI above, she did some abdominal pain that lasted for 2 days but resolved after having a bowel  movement.  Patient has a history of constipation.  She does not have any abdominal pain I think this is most likely attributed to the constipation she was having.  I will order a COVID PCR to evaluate her respiratory symptoms.  I did not think that she has influenza as she has been afebrile.  COVID PCR is negative.  I will discharge patient home with a diagnosis of viral URI with a cough and prescribe Atrovent nasal spray to help her with her congestion and runny nose, Tessalon Perles for the cough, Promethazine DM cough syrup for cough and congestion.  Final Clinical Impressions(s) / UC Diagnoses   Final diagnoses:  Viral URI with cough     Discharge Instructions      Your test for COVID today was negative but I do believe that you have a viral respiratory infection.  Please use the following medications to help your symptoms.  Use the Atrovent nasal spray, 2 squirts in each nostril every 6 hours, as needed for runny nose and postnasal drip.  Use the Tessalon Perles every 8 hours during the day.  Take them with a small sip of water.  They may give you some numbness to the base of your tongue or a metallic taste in your mouth, this is normal.  Use the Promethazine DM cough syrup at bedtime for cough and congestion.  It will make you drowsy so do not take it during the day.  Return for reevaluation or see your primary care provider for any new or worsening symptoms.      ED Prescriptions     Medication Sig Dispense Auth. Provider   benzonatate (TESSALON) 100 MG capsule Take 2 capsules (200 mg total) by mouth every 8 (eight) hours. 21 capsule Margarette Canada, NP   ipratropium (ATROVENT) 0.06 % nasal spray Place 2 sprays into both nostrils 4 (four) times daily. 15 mL Margarette Canada, NP   promethazine-dextromethorphan (PROMETHAZINE-DM) 6.25-15 MG/5ML syrup Take 5 mLs by mouth 4 (four) times daily as needed. 118 mL Margarette Canada, NP      PDMP not reviewed this encounter.   Margarette Canada,  NP 11/08/22 1758

## 2022-11-08 NOTE — Discharge Instructions (Addendum)
Your test for COVID today was negative but I do believe that you have a viral respiratory infection.  Please use the following medications to help your symptoms.  Use the Atrovent nasal spray, 2 squirts in each nostril every 6 hours, as needed for runny nose and postnasal drip.  Use the Tessalon Perles every 8 hours during the day.  Take them with a small sip of water.  They may give you some numbness to the base of your tongue or a metallic taste in your mouth, this is normal.  Use the Promethazine DM cough syrup at bedtime for cough and congestion.  It will make you drowsy so do not take it during the day.  Return for reevaluation or see your primary care provider for any new or worsening symptoms.

## 2022-11-08 NOTE — ED Triage Notes (Signed)
Pt c/o abdominal pain, sore troat, nasal congestion, loss of voice, body chills x5days  Pt states her abdominal pain started on Monday and other symptoms developed on Wednesday.

## 2023-02-18 ENCOUNTER — Other Ambulatory Visit: Payer: Self-pay | Admitting: Family Medicine

## 2023-02-18 DIAGNOSIS — J0110 Acute frontal sinusitis, unspecified: Secondary | ICD-10-CM

## 2023-02-18 NOTE — Telephone Encounter (Signed)
Requested Prescriptions  Pending Prescriptions Disp Refills   fluticasone (FLONASE) 50 MCG/ACT nasal spray [Pharmacy Med Name: FLUTICASONE PROP 50 MCG SPRAY] 48 mL 0    Sig: PLACE 2 SPRAYS INTO BOTH NOSTRILS DAILY. USE FOR 4-6 WEEKS THEN STOP AND USE SEASONALLY OR AS NEEDED     Ear, Nose, and Throat: Nasal Preparations - Corticosteroids Passed - 02/18/2023  1:34 AM      Passed - Valid encounter within last 12 months    Recent Outpatient Visits           6 months ago Adjustment disorder with depressed mood   [place] [facility] Smitty Cords, DO   1 year ago Acute non-recurrent frontal sinusitis   [facility] Smitty Cords, DO   1 year ago Annual physical exam   [place] Smitty Cords, DO   2 years ago Pre-diabetes   [place] [facility] Smitty Cords, DO   3 years ago Annual physical exam   [facility] [place], Netta Neat, [place]

## 2023-03-23 ENCOUNTER — Ambulatory Visit
Admission: EM | Admit: 2023-03-23 | Discharge: 2023-03-23 | Disposition: A | Discharge status: Home / Self Care | Payer: Medicare Other | Attending: Physician Assistant | Admitting: Physician Assistant

## 2023-03-23 DIAGNOSIS — S51002A Unspecified open wound of left elbow, initial encounter: Secondary | ICD-10-CM | POA: Diagnosis not present

## 2023-03-23 DIAGNOSIS — S51001A Unspecified open wound of right elbow, initial encounter: Secondary | ICD-10-CM | POA: Diagnosis not present

## 2023-03-23 MED ORDER — DOXYCYCLINE HYCLATE 100 MG PO CAPS: 100.0000 mg | ORAL_CAPSULE | Freq: Two times a day (BID) | ORAL | 0 refills | Status: AC

## 2023-03-23 MED ORDER — MUPIROCIN 2 % EX OINT: 1.0000 | TOPICAL_OINTMENT | Freq: Two times a day (BID) | CUTANEOUS | 0 refills | Status: DC

## 2023-03-23 NOTE — ED Triage Notes (Signed)
Pt states that she went to [place] on [place]. She noticed her elbows were sore and and blisters.

## 2023-03-23 NOTE — ED Provider Notes (Signed)
[facility]    CSN: [id number] Arrival date & time: 03/23/23  1335      History   Chief Complaint Chief Complaint  Patient presents with   Elbow Injury    HPI Amanda Pierce is a 67 y.o. female presenting for open wounds of bilateral elbows for the past 3 days.  Patient reports that she went to [employer] with her friend and sat and talked for 3 to 4 hours.  She reports having her elbows up on the table for hours at a time.  Reports she noticed a couple of blisters that then popped.  She reports increased pain, swelling and generalized discomfort.  Able to straighten elbows and overall normal range of motion.  Slight surrounding redness.  No drainage.  No fever.  Patient reports a history of very sensitive skin.  She denies noticing any substances on the table.  Thinks she could have come in contact with a cleaning substance that irritated her skin but is not sure.  Has been applying triamcinolone without relief.  HPI  Past Medical History:  Diagnosis Date   Allergy    Anemia    Asthma    Hypothyroidism    Thyroid disease    hypothyroid    Patient Active Problem List   Diagnosis Date Noted   Sprain of shoulder and upper arm 05/09/2020   Impingement syndrome of shoulder region 05/09/2020   Stomach irritation    Gastric polyp    Special screening for malignant neoplasms, colon    Pain in joint of right shoulder 04/15/2019   Iron deficiency anemia 02/26/2019   Vitamin D deficiency 01/04/2019   Elevated BP without diagnosis of hypertension 12/07/2018   Osteopenia of spine 09/17/2018   Hiatal hernia 08/28/2018   Obesity (BMI 30.0-34.9) 05/30/2018   Gastroesophageal reflux disease without esophagitis 05/30/2018   Pre-diabetes 05/30/2018   Pure hypercholesterolemia 05/30/2018   Hypothyroidism (acquired) 05/29/2018   Suspected sleep apnea 05/29/2018    Past Surgical History:  Procedure Laterality Date   BREAST MASS EXCISION Left 1974   benign   COLONOSCOPY  WITH PROPOFOL N/A 10/28/2019   Procedure: COLONOSCOPY WITH PROPOFOL;  Surgeon: Pasty Spillers, MD;  Location: [facility] ENDOSCOPY;  Service: Endoscopy;  Laterality: N/A;   ENDOSCOPIC MUCOSAL RESECTION N/A 11/22/2019   Procedure: ENDOSCOPIC MUCOSAL RESECTION;  Surgeon: Meridee Score Netty Starring., MD;  Location[facility] END[facility]COPY;  Service: Gastroenterology;  Laterality: N/A;   ESOPHAGOGASTRODUODENOSCOPY (EGD) WITH PROPOFOL N/A 10/28/2019   Procedure: ESOPHAGOGASTRODUODENOSCOPY (EGD) WITH PROPOFOL;  Surgeon: Pasty Spillers, MD;  Location: [facility] ENDOSCOPY;  Service: Endoscopy;  Laterality: N/A;   ESOPHAGOGASTRODUODENOSCOPY (EGD) WITH PROPOFOL N/A 11/22/2019   Procedure: ESOPHAGOGASTRODUODENOSCOPY (EGD) WITH PROPOFOL;  Surgeon: Meridee Score Netty Starring., MD;  Location: [facility] ENDOSCOPY;  Service: Gastroenterology;  Laterality: N/A;   HEMOSTASIS CLIP PLACEMENT  11/22/2019   Procedure: HEMOSTASIS CLIP PLACEMENT;  Surgeon: Meridee Score Netty Starring., MD;  Location: [facility] ENDOSCOPY;  Service: Gastroenterology;;   HEMOSTASIS CONTROL  11/22/2019   Procedure: HEMOSTASIS CONTROL;  Surgeon: Lemar Lofty., MD;  Location: [facility] ENDOSCOPY;  Service: Gastroenterology;;  epi    nodule removed  1982   from thyroid -benign   PARATHYROIDECTOMY  1982   patient denies this dx   SUBMUCOSAL LIFTING INJECTION  11/22/2019   Procedure: SUBMUCOSAL LIFTING INJECTION;  Surgeon: Lemar Lofty., MD;  Location: [facility] ENDOSCOPY;  Service: Gastroenterology;;   TUBAL LIGATION     WISDOM TOOTH EXTRACTION      OB History   No obstetric  history on file.      Home Medications    Prior to Admission medications   Medication Sig Start Date End Date Taking? Authorizing Provider  doxycycline (VIBRAMYCIN) 100 MG capsule Take 1 capsule (100 mg total) by mouth 2 (two) times daily for 7 days. 03/23/23 03/30/23 Yes Shirlee Latch, PA-C  mupirocin ointment (BACTROBAN) 2 % Apply 1 Application topically 2 (two) times daily. 03/23/23  Yes Shirlee Latch, PA-C  benzonatate (TESSALON) 100 MG capsule Take 2 capsules (200 mg total) by mouth every 8 (eight) hours. 11/08/22   Becky Augusta, NP  fexofenadine (ALLEGRA) 180 MG tablet Take 180 mg by mouth daily as needed for allergies or rhinitis.    [provider]  fluticasone (FLONASE) 50 MCG/ACT nasal spray PLACE 2 SPRAYS INTO BOTH NOSTRILS DAILY. USE FOR 4-6 WEEKS THEN STOP AND USE SEASONALLY OR AS NEEDED 02/18/23   Althea Charon, Netta Neat, DO  ipratropium (ATROVENT) 0.06 % nasal spray Place 2 sprays into both nostrils 4 (four) times daily. 11/08/22   Becky Augusta, NP  levothyroxine (SYNTHROID) 150 MCG tablet 150 mcg daily before breakfast. 04/01/21 04/01/22  [provider]  montelukast (SINGULAIR) 10 MG tablet TAKE 1 TABLET BY MOUTH EVERYDAY AT BEDTIME 10/14/22   Karamalegos, Netta Neat, DO  nystatin cream (MYCOSTATIN) Apply 1 application  topically as needed. 10/13/21   [provider]  phentermine 37.5 MG capsule Take 37.5 mg by mouth every morning.    [provider]  promethazine-dextromethorphan (PROMETHAZINE-DM) 6.25-15 MG/5ML syrup Take 5 mLs by mouth 4 (four) times daily as needed. 11/08/22   Becky Augusta, NP    Family History Family History  Problem Relation Age of Onset   Cancer Mother        Breast, Paget's removed nipple 39, breast 2001   Colon polyps Mother    Paget's disease of bone Mother    Hypertension Mother    Hyperlipidemia Mother    Heart disease Father    Heart attack Father    Heart disease Maternal Grandfather    Stroke Paternal Grandmother    Colon cancer Neg Hx    Esophageal cancer Neg Hx    Inflammatory bowel disease Neg Hx    Liver disease Neg Hx    Pancreatic cancer Neg Hx    Rectal cancer Neg Hx    Stomach cancer Neg Hx     Social History Social History   Tobacco Use   Smoking status: Never   Smokeless tobacco: Never  Vaping Use   Vaping Use: Never used  Substance Use Topics   Alcohol use: Never   Drug use:  Never     Allergies   Aspirin, Contrast media [iodinated contrast media], Elemental sulfur, Ibuprofen, Latex, Penicillins, Rubbing alcohol [alcohol], Adhesive [tape], Medical adhesive remover, and Sulfa antibiotics   Review of Systems Review of Systems  Constitutional:  Negative for fatigue and fever.  Musculoskeletal:  Positive for arthralgias and joint swelling.  Skin:  Positive for color change and wound. Negative for rash.  Neurological:  Negative for weakness and numbness.     Physical Exam Triage Vital Signs ED Triage Vitals  Enc Vitals Group     BP 03/23/23 1353 (!) 168/89     Pulse Rate 03/23/23 1353 64     Resp 03/23/23 1353 18     Temp 03/23/23 1353 98.3 F (36.8 C)     Temp Source 03/23/23 1353 Oral     SpO2 03/23/23 1353 98 %  Weight 03/23/23 1351 221 lb (100.2 kg)     Height --      Head Circumference --      Peak Flow --      Pain Score 03/23/23 1351 9     Pain Loc --      Pain Edu? --      Excl. in GC? --    No data found.  Updated Vital Signs BP (!) 168/89 (BP Location: Left Arm)   Pulse 64   Temp 98.3 F (36.8 C) (Oral)   Resp 18   Wt 221 lb (100.2 kg)   LMP  (LMP Unknown)   SpO2 98%   BMI 33.60 kg/m   Physical Exam Vitals and nursing note reviewed.  Constitutional:      General: She is not in acute distress.    Appearance: Normal appearance. She is not ill-appearing or toxic-appearing.  HENT:     Head: Normocephalic and atraumatic.     Nose: Nose normal.     Mouth/Throat:     Mouth: Mucous membranes are moist.     Pharynx: Oropharynx is clear.  Eyes:     General: No scleral icterus.       Right eye: No discharge.        Left eye: No discharge.     Conjunctiva/sclera: Conjunctivae normal.  Cardiovascular:     Rate and Rhythm: Normal rate and regular rhythm.     Heart sounds: Normal heart sounds.  Pulmonary:     Effort: Pulmonary effort is normal. No respiratory distress.     Breath sounds: Normal breath sounds.   Musculoskeletal:     Cervical back: Neck supple.  Skin:    General: Skin is dry.     Comments: Bilateral elbows: There are 2 small open wounds of posterior elbows over the olecranons without bleeding or drainage.  There is slight surrounding erythema.  No increased warmth.  Slight swelling of posterior elbows but full range of motion.  Appears to be tender to palpation.  Neurological:     General: No focal deficit present.     Mental Status: She is alert. Mental status is at baseline.     Motor: No weakness.     Gait: Gait normal.  Psychiatric:        Mood and Affect: Mood normal.        Behavior: Behavior normal.        Thought Content: Thought content normal.      UC Treatments / Results  Labs (all labs ordered are listed, but only abnormal results are displayed) Labs Reviewed - No data to display  EKG   Radiology No results found.  Procedures Procedures (including critical care time)  Medications Ordered in UC Medications - No data to display  Initial Impression / Assessment and Plan / UC Course  I have reviewed the triage vital signs and the nursing notes.  Pertinent labs & imaging results that were available during my care of the patient were reviewed by me and considered in my medical decision making (see chart for details).   67 year old female presents for bilateral elbow pain, swelling and open wounds for the past 3 days.  Symptoms started after she was at [place] with her elbows on the tables for 3 to 4 hours.  Has tried triamcinolone ointment without relief.  On exam she has 2 small healing wounds with slight surrounding erythema which could represent early cellulitis.  Will treat at this time with mupirocin  ointment.  Advised ice, Tylenol, elevation.  Printed prescription for doxycycline in case symptoms were to worsen.  Follow-up as needed.   Final Clinical Impressions(s) / UC Diagnoses   Final diagnoses:  Open wound of left elbow, initial encounter   Open wound of right elbow, initial encounter     Discharge Instructions      -The wounds look inflamed and irritated.  Ice the area and take Tylenol for pain relief. - Clean with soap and water every day and apply the mupirocin ointment as directed. - Low suspicion for infection but if you notice increased swelling, worsening redness or worsening pain then take the oral antibiotic on the printed prescription I gave you.     ED Prescriptions     Medication Sig Dispense Auth. Provider   mupirocin ointment (BACTROBAN) 2 % Apply 1 Application topically 2 (two) times daily. 22 g Eusebio Friendly B, PA-C   doxycycline (VIBRAMYCIN) 100 MG capsule Take 1 capsule (100 mg total) by mouth 2 (two) times daily for 7 days. 14 capsule Shirlee Latch, PA-C      PDMP not reviewed this encounter.   Shirlee Latch, PA-C 03/23/23 1458

## 2023-03-23 NOTE — Discharge Instructions (Addendum)
-  The wounds look inflamed and irritated.  Ice the area and take Tylenol for pain relief. - Clean with soap and water every day and apply the mupirocin ointment as directed. - Low suspicion for infection but if you notice increased swelling, worsening redness or worsening pain then take the oral antibiotic on the printed prescription I gave you.

## 2023-04-09 ENCOUNTER — Other Ambulatory Visit: Payer: Self-pay | Admitting: Family Medicine

## 2023-04-09 DIAGNOSIS — J3089 Other allergic rhinitis: Secondary | ICD-10-CM

## 2023-04-09 NOTE — Telephone Encounter (Signed)
Requested Prescriptions  Pending Prescriptions Disp Refills   montelukast (SINGULAIR) 10 MG tablet [Pharmacy Med Name: MONTELUKAST SOD 10 MG TABLET] 90 tablet 0    Sig: TAKE 1 TABLET BY MOUTH EVERYDAY AT BEDTIME     Pulmonology:  Leukotriene Inhibitors Passed - 04/09/2023  1:55 AM      Passed - Valid encounter within last 12 months    Recent Outpatient Visits           8 months ago Adjustment disorder with depressed mood   [place] [facility] Smitty Cords, DO   1 year ago Acute non-recurrent frontal sinusitis   [place] [facility] Smitty Cords, DO   1 year ago Annual physical exam   [place] [facility] Smitty Cords, DO   2 years ago Pre-diabetes   [place] [facility] Smitty Cords, DO   3 years ago Annual physical exam   [place] [facility] [place], Netta Neat, [place]

## 2023-05-15 ENCOUNTER — Other Ambulatory Visit: Payer: Self-pay | Admitting: Family Medicine

## 2023-05-15 DIAGNOSIS — J0110 Acute frontal sinusitis, unspecified: Secondary | ICD-10-CM

## 2023-05-16 NOTE — Telephone Encounter (Signed)
Requested Prescriptions  Pending Prescriptions Disp Refills   fluticasone (FLONASE) 50 MCG/ACT nasal spray [Pharmacy Med Name: FLUTICASONE PROP 50 MCG SPRAY] 48 mL 0    Sig: PLACE 2 SPRAYS INTO BOTH NOSTRILS DAILY. USE FOR 4-6 WEEKS THEN STOP AND USE SEASONALLY OR AS NEEDED     Ear, Nose, and Throat: Nasal Preparations - Corticosteroids Passed - 05/15/2023 11:32 AM      Passed - Valid encounter within last 12 months    Recent Outpatient Visits           9 months ago Adjustment disorder with depressed mood   [place] [facility] Smitty Cords, DO   1 year ago Acute non-recurrent frontal sinusitis   [place] [facility] Smitty Cords, DO   1 year ago Annual physical exam   [place] [facility] Smitty Cords, DO   3 years ago Pre-diabetes   [place] [facility] Smitty Cords, DO   3 years ago Annual physical exam   [place] [facility] [place], Netta Neat, [place]

## 2023-07-13 ENCOUNTER — Other Ambulatory Visit: Payer: Self-pay | Admitting: Family Medicine

## 2023-07-13 DIAGNOSIS — J3089 Other allergic rhinitis: Secondary | ICD-10-CM

## 2023-08-17 ENCOUNTER — Other Ambulatory Visit: Payer: Self-pay | Admitting: Family Medicine

## 2023-08-17 DIAGNOSIS — J3089 Other allergic rhinitis: Secondary | ICD-10-CM

## 2023-08-18 NOTE — Telephone Encounter (Signed)
Requested medications are due for refill today.  yes  Requested medications are on the active medications list.  yes  Last refill. 07/14/2023 #30 0 rf  Future visit scheduled.   no  Notes to clinic.  Pt last seen 08/05/2022. Pt already given a courtesy refill.    Requested Prescriptions  Pending Prescriptions Disp Refills   montelukast (SINGULAIR) 10 MG tablet [Pharmacy Med Name: MONTELUKAST SOD 10 MG TABLET] 30 tablet 0    Sig: TAKE 1 TABLET BY MOUTH EVERYDAY AT BEDTIME     Pulmonology:  Leukotriene Inhibitors Failed - 08/17/2023 10:02 AM      Failed - Valid encounter within last 12 months    Recent Outpatient Visits           1 year ago Adjustment disorder with depressed mood   [place] [facility] Smitty Cords, DO   1 year ago Acute non-recurrent frontal sinusitis   [place] [facility] Smitty Cords, DO   2 years ago Annual physical exam   [place] [facility] Smitty Cords, DO   3 years ago Pre-diabetes   [place] [facility] Smitty Cords, DO   3 years ago Annual physical exam    [facility] [place], Netta Neat, [place]

## 2023-08-24 ENCOUNTER — Encounter: Payer: Self-pay | Admitting: Emergency Medicine

## 2023-08-24 ENCOUNTER — Ambulatory Visit
Admission: EM | Admit: 2023-08-24 | Discharge: 2023-08-24 | Disposition: A | Discharge status: Home / Self Care | Payer: Medicare Other | Attending: Physician Assistant | Admitting: Physician Assistant

## 2023-08-24 DIAGNOSIS — N3001 Acute cystitis with hematuria: Secondary | ICD-10-CM | POA: Diagnosis present

## 2023-08-24 LAB — URINALYSIS, W/ REFLEX TO CULTURE (INFECTION SUSPECTED)
Bilirubin Urine: NEGATIVE
Glucose, UA: NEGATIVE mg/dL
Ketones, ur: NEGATIVE mg/dL
Nitrite: NEGATIVE
Protein, ur: 30 mg/dL — AB
Specific Gravity, Urine: 1.01 (ref 1.005–1.030)
WBC, UA: >50 WBC/hpf (ref 0–5)
pH: 6.5 (ref 5.0–8.0)

## 2023-08-24 MED ORDER — NITROFURANTOIN MONOHYD MACRO 100 MG PO CAPS: 100.0000 mg | ORAL_CAPSULE | Freq: Two times a day (BID) | ORAL | 0 refills | Status: AC

## 2023-08-24 MED ORDER — PHENAZOPYRIDINE HCL 200 MG PO TABS: 200.0000 mg | ORAL_TABLET | Freq: Three times a day (TID) | ORAL | 0 refills | Status: DC

## 2023-08-24 NOTE — ED Provider Notes (Signed)
[facility] [place], [place]   Name: Amanda Pierce DOB: 1956/08/13 MRN: [id number] CSN: [id number] PCP: Amanda Cords, DO  Arrival date and time:  08/24/23 1416  Chief Complaint:  Dysuria   NOTE: Prior to seeing the patient today, I have reviewed the triage nursing documentation and vital signs. Clinical staff has updated patient's PMH/PSHx, current medication list, and drug allergies/intolerances to ensure comprehensive history available to assist in medical decision making.   History:   HPI: Amanda Pierce is a 67 y.o. female who presents today alone with complaints of UTI symptoms.  Patient states the symptoms have been on and off the past week, consisting mostly of frequency.  However she started noticed dysuria the past 24 hours.  This dysuria has continued to increase as time progressed and she is here for further evaluation.  She has not had a UTI in the past 40 years; no recent antibiotics.  No fevers chills or bodyaches.   Past Medical History:  Diagnosis Date   Allergy    Anemia    Asthma    Hypothyroidism    Thyroid disease    hypothyroid    Past Surgical History:  Procedure Laterality Date   BREAST MASS EXCISION Left 1974   benign   COLONOSCOPY WITH PROPOFOL N/A 10/28/2019   Procedure: COLONOSCOPY WITH PROPOFOL;  Surgeon: Amanda Spillers, MD;  Location: [facility] ENDOSCOPY;  Service: Endoscopy;  Laterality: N/A;   ENDOSCOPIC MUCOSAL RESECTION N/A 11/22/2019   Procedure: ENDOSCOPIC MUCOSAL RESECTION;  Surgeon: Amanda Score Amanda Starring., MD;  Location: [facility] ENDOSCOPY;  Service: Gastroenterology;  Laterality: N/A;   ESOPHAGOGASTRODUODENOSCOPY (EGD) WITH PROPOFOL N/A 10/28/2019   Procedure: ESOPHAGOGASTRODUODENOSCOPY (EGD) WITH PROPOFOL;  Surgeon: Amanda Spillers, MD;  Location: [facility] ENDOSCOPY;  Service: Endoscopy;  Laterality: N/A;   ESOPHAGOGASTRODUODENOSCOPY (EGD) WITH PROPOFOL N/A 11/22/2019   Procedure: ESOPHAGOGASTRODUODENOSCOPY (EGD) WITH  PROPOFOL;  Surgeon: Amanda Score Amanda Starring., MD;  Location: [facility] ENDOSCOPY;  Service: Gastroenterology;  Laterality: N/A;   HEMOSTASIS CLIP PLACEMENT  11/22/2019   Procedure: HEMOSTASIS CLIP PLACEMENT;  Surgeon: Amanda Score Amanda Starring., MD;  Location[facility] ENDOSCOPY;  Service: Gastroenterology;;   HEMOSTASIS CONTROL  11/22/2019   Procedure: HEMOSTASIS CONTROL;  Surgeon: Amanda Lofty., MD;  [facility] END[facility];  Service: Gastroenterology;;  epi    nodule removed  1982   from thyroid -benign   PARATHYROIDECTOMY  1982   patient denies this dx   SUBMUCOSAL LIFTING INJECTION  11/22/2019   Procedure: SUBMUCOSAL LIFTING INJECTION;  Surgeon: Amanda Lofty., MD;  Location: [facility] ENDOSCOPY;  Service: Gastroenterology;;   TUBAL LIGATION     WISDOM TOOTH EXTRACTION      Family History  Problem Relation Age of Onset   Cancer Mother        Breast, Paget's removed nipple 62, breast 2001   Colon polyps Mother    Paget's disease of bone Mother    Hypertension Mother    Hyperlipidemia Mother    Heart disease Father    Heart attack Father    Heart disease Maternal Grandfather    Stroke Paternal Grandmother    Colon cancer Neg Hx    Esophageal cancer Neg Hx    Inflammatory bowel disease Neg Hx    Liver disease Neg Hx    Pancreatic cancer Neg Hx    Rectal cancer Neg Hx    Stomach cancer Neg Hx     Social History   Tobacco Use   Smoking status: Never   Smokeless tobacco: Never  Vaping Use   Vaping status: Never Used  Substance Use Topics   Alcohol use: Never   Drug use: Never    Patient Active Problem List   Diagnosis Date Noted   Sprain of shoulder and upper arm 05/09/2020   Impingement syndrome of shoulder region 05/09/2020   Stomach irritation    Gastric polyp    Special screening for malignant neoplasms, colon    Pain in joint of right shoulder 04/15/2019   Iron deficiency anemia 02/26/2019   Vitamin D deficiency 01/04/2019   Elevated BP without diagnosis of  hypertension 12/07/2018   Osteopenia of spine 09/17/2018   Hiatal hernia 08/28/2018   Obesity (BMI 30.0-34.9) 05/30/2018   Gastroesophageal reflux disease without esophagitis 05/30/2018   Pre-diabetes 05/30/2018   Pure hypercholesterolemia 05/30/2018   Hypothyroidism (acquired) 05/29/2018   Suspected sleep apnea 05/29/2018    Home Medications:    No outpatient medications have been marked as taking for the 08/24/23 encounter [facility] Encounter).    Allergies:   Aspirin, Contrast media [iodinated contrast media], Elemental sulfur, Ibuprofen, Latex, Penicillins, Rubbing alcohol [alcohol], Adhesive [tape], Medical adhesive remover, and Sulfa antibiotics  Review of Systems (ROS): Review of Systems   Vital Signs: Today's Vitals   08/24/23 1454 08/24/23 1455 08/24/23 1456  BP:   (!) 156/82  Pulse:   72  Resp:   14  Temp:   98.5 F (36.9 C)  TempSrc:   Oral  SpO2:   97%  Weight:  220 lb 14.4 oz (100.2 kg)   Height:  5\' 8"  (1.727 m)   PainSc: 5       Physical Exam: Physical Exam   Urgent Care Treatments / Results:   LABS: PLEASE NOTE: all labs that were ordered this encounter are listed, however only abnormal results are displayed. Labs Reviewed  URINALYSIS, W/ REFLEX TO CULTURE (INFECTION SUSPECTED) - Abnormal; Notable for the following components:      Result Value   Hgb urine dipstick LARGE (*)    Protein, ur 30 (*)    Leukocytes,Ua MODERATE (*)    Bacteria, UA FEW (*)    All other components within normal limits  URINE CULTURE    EKG: -None  RADIOLOGY: No results found.  PROCEDURES: Procedures  MEDICATIONS RECEIVED THIS VISIT: Medications - No data to display  PERTINENT CLINICAL COURSE NOTES/UPDATES:   Initial Impression / Assessment and Plan / Urgent Care Course:  Pertinent labs & imaging results that were available during my care of the patient were personally reviewed by me and considered in my medical decision making (see lab/imaging section of  note for values and interpretations).  Amanda Pierce is a 67 y.o. female who presents to [facility] today with complaints of urinary symptoms, diagnosed with acute cystitis, and treated as such with the medications below. NP and patient reviewed discharge instructions below during visit.   Patient is well appearing overall in clinic today. She does not appear to be in any acute distress. Presenting symptoms (see HPI) and exam as documented above.   I have reviewed the follow up and strict return precautions for any new or worsening symptoms. Patient is aware of symptoms that would be deemed urgent/emergent, and would thus require further evaluation either here or in the emergency department. At the time of discharge, she verbalized understanding and consent with the discharge plan as it was reviewed with her. All questions were fielded by provider and/or clinic staff prior to patient discharge.    Final Clinical  Impressions / Urgent Care Diagnoses:   Final diagnoses:  Acute cystitis with hematuria    New Prescriptions:  [place] Controlled Substance Registry consulted? Not Applicable  Meds ordered this encounter  Medications   nitrofurantoin, macrocrystal-monohydrate, (MACROBID) 100 MG capsule    Sig: Take 1 capsule (100 mg total) by mouth 2 (two) times daily for 7 days.    Dispense:  14 capsule    Refill:  0   phenazopyridine (PYRIDIUM) 200 MG tablet    Sig: Take 1 tablet (200 mg total) by mouth 3 (three) times daily.    Dispense:  6 tablet    Refill:  0      Discharge Instructions      You were seen for urinary symptoms and are being treated for urinary tract infection.   - We are sending your urine out for a culture. If we need to add or change any medications, our nurse will give you a call to let you know. - Take the antibiotics as prescribed until they're finished. If you think you're having a reaction, stop the medication, take benadryl and go to the nearest urgent  care/emergency room. Take a probiotic while taking the antibiotic to decrease the chances of stomach upset.  -Increase your water intake.  Take care, Dr. Sharlet Salina, NP-c      Recommended Follow up Care:  Patient encouraged to follow up with the following provider within the specified time frame, or sooner as dictated by the severity of her symptoms. As always, she was instructed that for any urgent/emergent care needs, she should seek care either here or in the emergency department for more immediate evaluation.   Bailey Mech, DNP, NP-c   Bailey Mech, NP 08/24/23 1606

## 2023-08-24 NOTE — ED Triage Notes (Signed)
Patient reports burning when urinating that started yesterday.

## 2023-08-24 NOTE — Discharge Instructions (Signed)
You were seen for urinary symptoms and are being treated for urinary tract infection.   - We are sending your urine out for a culture. If we need to add or change any medications, our nurse will give you a call to let you know. - Take the antibiotics as prescribed until they're finished. If you think you're having a reaction, stop the medication, take benadryl and go to the nearest urgent care/emergency room. Take a probiotic while taking the antibiotic to decrease the chances of stomach upset.  -Increase your water intake.  Take care, Dr. Sharlet Salina, NP-c

## 2023-08-25 LAB — URINE CULTURE: Culture: <10000 — AB

## 2023-09-13 ENCOUNTER — Other Ambulatory Visit: Payer: Self-pay | Admitting: Family Medicine

## 2023-09-13 DIAGNOSIS — J3089 Other allergic rhinitis: Secondary | ICD-10-CM

## 2023-09-15 NOTE — Telephone Encounter (Signed)
Called pt to schedule appt. Pt stated that she needed a MWV. Pt stated she will call back to schedule. Pt stated her endocrinologist takes care of her levothyroxine. Last RF 08/19/23 #30. "Need appt for refills"  Requested Prescriptions  Pending Prescriptions Disp Refills   montelukast (SINGULAIR) 10 MG tablet [Pharmacy Med Name: MONTELUKAST SOD 10 MG TABLET] 30 tablet 0    Sig: TAKE 1 TABLET BY MOUTH EVERYDAY AT BEDTIME     Pulmonology:  Leukotriene Inhibitors Failed - 09/13/2023  9:14 AM      Failed - Valid encounter within last 12 months    Recent Outpatient Visits           1 year ago Adjustment disorder with depressed mood   [place] [facility] Smitty Cords, DO   1 year ago Acute non-recurrent frontal sinusitis   [place] Smitty Cords, DO   2 years ago Annual physical exam   [facility] Smitty Cords, DO   3 years ago Pre-diabetes   [place] [facility] Smitty Cords, DO   3 years ago Annual physical exam   [facility] [place], [place], [place]

## 2023-09-29 ENCOUNTER — Other Ambulatory Visit: Payer: Self-pay | Admitting: Family Medicine

## 2023-09-29 DIAGNOSIS — J3089 Other allergic rhinitis: Secondary | ICD-10-CM

## 2023-10-01 NOTE — Telephone Encounter (Signed)
Courtesy refill given, appointment required for additional refills. Requested Prescriptions  Pending Prescriptions Disp Refills   montelukast (SINGULAIR) 10 MG tablet [Pharmacy Med Name: MONTELUKAST SOD 10 MG TABLET] 90 tablet 1    Sig: TAKE 1 TABLET BY MOUTH EVERYDAY AT BEDTIME     Pulmonology:  Leukotriene Inhibitors Failed - 09/29/2023  7:35 AM      Failed - Valid encounter within last 12 months    Recent Outpatient Visits           1 year ago Adjustment disorder with depressed mood   [place] [facility] Smitty Cords, DO   1 year ago Acute non-recurrent frontal sinusitis   [place] [facility] Smitty Cords, DO   2 years ago Annual physical exam   [place] [facility] Smitty Cords, DO   3 years ago Pre-diabetes   [place] [facility] Smitty Cords, DO   3 years ago Annual physical exam   [place] [facility] [place], [place], [place]

## 2023-10-29 DIAGNOSIS — C801 Malignant (primary) neoplasm, unspecified: Secondary | ICD-10-CM

## 2023-10-29 HISTORY — DX: Malignant (primary) neoplasm, unspecified: C80.1

## 2023-11-12 LAB — HM MAMMOGRAPHY

## 2023-12-02 ENCOUNTER — Other Ambulatory Visit: Payer: Self-pay | Admitting: Family Medicine

## 2023-12-02 DIAGNOSIS — R7303 Prediabetes: Secondary | ICD-10-CM

## 2023-12-02 DIAGNOSIS — E7800 Pure hypercholesterolemia, unspecified: Secondary | ICD-10-CM

## 2023-12-02 DIAGNOSIS — D508 Other iron deficiency anemias: Secondary | ICD-10-CM

## 2023-12-02 DIAGNOSIS — E559 Vitamin D deficiency, unspecified: Secondary | ICD-10-CM

## 2023-12-02 DIAGNOSIS — E66811 Obesity, class 1: Secondary | ICD-10-CM

## 2023-12-02 DIAGNOSIS — Z0000 Encounter for general adult medical examination without abnormal findings: Secondary | ICD-10-CM

## 2023-12-03 ENCOUNTER — Other Ambulatory Visit: Payer: Self-pay

## 2023-12-04 ENCOUNTER — Encounter: Payer: Self-pay | Admitting: Family Medicine

## 2023-12-04 LAB — COMPLETE METABOLIC PANEL WITH GFR
AG Ratio: 1.2 (calc) (ref 1.0–2.5)
ALT: 14 U/L (ref 6–29)
AST: 23 U/L (ref 10–35)
Albumin: 3.9 g/dL (ref 3.6–5.1)
Alkaline phosphatase (APISO): 73 U/L (ref 37–153)
BUN: 12 mg/dL (ref 7–25)
CO2: 28 mmol/L (ref 20–32)
Calcium: 9 mg/dL (ref 8.6–10.4)
Chloride: 107 mmol/L (ref 98–110)
Creat: 0.79 mg/dL (ref 0.50–1.05)
Globulin: 3.3 g/dL (ref 1.9–3.7)
Glucose, Bld: 111 mg/dL — ABNORMAL HIGH (ref 65–99)
Potassium: 3.9 mmol/L (ref 3.5–5.3)
Sodium: 143 mmol/L (ref 135–146)
Total Bilirubin: 0.4 mg/dL (ref 0.2–1.2)
Total Protein: 7.2 g/dL (ref 6.1–8.1)
eGFR: 81 mL/min/{1.73_m2} (ref >=60)

## 2023-12-04 LAB — CBC WITH DIFFERENTIAL/PLATELET
Absolute Lymphocytes: 1320 {cells}/uL (ref 850–3900)
Absolute Monocytes: 242 {cells}/uL (ref 200–950)
Basophils Absolute: 10 {cells}/uL (ref 0–200)
Basophils Relative: 0.4 %
Eosinophils Absolute: 10 {cells}/uL — ABNORMAL LOW (ref 15–500)
Eosinophils Relative: 0.4 %
HCT: 39.6 % (ref 35.0–45.0)
Hemoglobin: 12.9 g/dL (ref 11.7–15.5)
MCH: 27 pg (ref 27.0–33.0)
MCHC: 32.6 g/dL (ref 32.0–36.0)
MCV: 83 fL (ref 80.0–100.0)
MPV: 10.8 fL (ref 7.5–12.5)
Monocytes Relative: 10.1 %
Neutro Abs: 818 {cells}/uL — ABNORMAL LOW (ref 1500–7800)
Neutrophils Relative %: 34.1 %
Platelets: 233 10*3/uL (ref 140–400)
RBC: 4.77 10*6/uL (ref 3.80–5.10)
RDW: 14.4 % (ref 11.0–15.0)
Total Lymphocyte: 55 %
WBC: 2.4 10*3/uL — ABNORMAL LOW (ref 3.8–10.8)

## 2023-12-04 LAB — LIPID PANEL
Cholesterol: 194 mg/dL (ref <200)
HDL: 48 mg/dL — ABNORMAL LOW (ref >=50)
LDL Cholesterol (Calc): 124 mg/dL — ABNORMAL HIGH
Non-HDL Cholesterol (Calc): 146 mg/dL — ABNORMAL HIGH (ref <130)
Total CHOL/HDL Ratio: 4 (calc) (ref <5.0)
Triglycerides: 111 mg/dL (ref <150)

## 2023-12-04 LAB — HEMOGLOBIN A1C
Hgb A1c MFr Bld: 6.4 %{Hb} — ABNORMAL HIGH (ref <5.7)
Mean Plasma Glucose: 137 mg/dL
eAG (mmol/L): 7.6 mmol/L

## 2023-12-04 LAB — VITAMIN D 25 HYDROXY (VIT D DEFICIENCY, FRACTURES): Vit D, 25-Hydroxy: 16 ng/mL — ABNORMAL LOW (ref 30–100)

## 2023-12-10 ENCOUNTER — Ambulatory Visit: Payer: Medicare Other | Admitting: Family Medicine

## 2023-12-10 ENCOUNTER — Encounter: Payer: Self-pay | Admitting: Family Medicine

## 2023-12-10 VITALS — BP 132/78 | HR 69 | Ht 68.0 in | Wt 214.0 lb

## 2023-12-10 DIAGNOSIS — R7303 Prediabetes: Secondary | ICD-10-CM

## 2023-12-10 DIAGNOSIS — K449 Diaphragmatic hernia without obstruction or gangrene: Secondary | ICD-10-CM

## 2023-12-10 DIAGNOSIS — E7800 Pure hypercholesterolemia, unspecified: Secondary | ICD-10-CM

## 2023-12-10 DIAGNOSIS — Z0000 Encounter for general adult medical examination without abnormal findings: Secondary | ICD-10-CM | POA: Diagnosis not present

## 2023-12-10 DIAGNOSIS — K219 Gastro-esophageal reflux disease without esophagitis: Secondary | ICD-10-CM

## 2023-12-10 DIAGNOSIS — K317 Polyp of stomach and duodenum: Secondary | ICD-10-CM

## 2023-12-10 DIAGNOSIS — E559 Vitamin D deficiency, unspecified: Secondary | ICD-10-CM

## 2023-12-10 DIAGNOSIS — R030 Elevated blood-pressure reading, without diagnosis of hypertension: Secondary | ICD-10-CM

## 2023-12-10 DIAGNOSIS — E66811 Obesity, class 1: Secondary | ICD-10-CM

## 2023-12-10 DIAGNOSIS — H6993 Unspecified Eustachian tube disorder, bilateral: Secondary | ICD-10-CM

## 2023-12-10 DIAGNOSIS — D508 Other iron deficiency anemias: Secondary | ICD-10-CM

## 2023-12-10 MED ORDER — VITAMIN D3 125 MCG (5000 UT) PO CAPS: 5000.0000 [IU] | ORAL_CAPSULE | Freq: Every day | ORAL | Status: AC

## 2023-12-10 NOTE — Patient Instructions (Addendum)
Thank you for coming to the office today.  Low Vitamin D  Suspected contributing to symptoms Start OTC Vitamin D3 5,000 iu daily for 12 weeks then reduce to OTC Vitamin D3 2,000 iu daily for maintenance   Referral to return to Dr Judie Petit for GI and discuss repeat Upper and Lower Endoscopy/Colonoscopy  [facility] [place], [place] [address] [phone]   For Weight Loss / Obesity only  Zepbound TO Check Cost & Coverage of Zepbound Please contact Research officer, trade union (manufacturer for [employer]) 1-800-LillyRx (947) 103-3220) - Live agent to discuss cost and coverage.  Check into [place] weekly injection - check online for verified coverage checker on main wegovy website BakeryReview.is  Alternative options - cash pay  Semaglutide injection (mixed Ozempic) from [facility] 0.25mg  weekly for 4 weeks then increase to 0.5mg  weekly It comes in a vial and a needle syringe, you need to draw up the shot and self admin it weekly Cost is about $200 per month Call them to check pricing and availability  [facility] Address: [address] [place], [place], [place] [address] Phone: [phone]  --------------------------------   [facility] Online Virtual Doctor Team With insurance $79/mo + copay for medications Without insurance $79/mo + $99/mo to include medication (Semaglutide) Without insurance $79/mo + $199/mo to include medication (Tirzepatide) [email or web address]  ------   You have been referred for a Coronary Calcium Score Cardiac CT Scan. This is a screening test for patients aged 83-50+ with cardiovascular risk factors or who are healthy but would be interested in Cardiovascular Screening for heart disease. Even if there is a family history of heart disease, this imaging can be useful. Typically it can be done every 5+ years or at a different timeline we  agree on  The scan will look at the chest and mainly focus on the heart and identify early signs of calcium build up or blockages within the heart arteries. It is not 100% accurate for identifying blockages or heart disease, but it is useful to help [place] predict who may have some early changes or be at risk in the future for a heart attack or cardiovascular problem.  The results are reviewed by a Cardiologist and they will document the results. It should become available on MyChart. Typically the results are divided into percentiles based on other patients of the same demographic and age. So it will compare your risk to others similar to you. If you have a higher score >99 or higher percentile >75%tile, it is recommended to consider Statin cholesterol therapy and or referral to Cardiologist. I will try to help explain your results and if we have questions we can contact the Cardiologist.  You will be contacted for scheduling. Usually it is done at any imaging facility through [facility], [facility] or [facility].  The cost is $99 flat fee total and it does not go through insurance, so no authorization is required.     Please schedule a Follow-up Appointment to: Return in about 6 months (around 06/08/2024) for 6 month PreDM A1c.  If you have any other questions or concerns, please feel free to call the office or send a message through MyChart. You may also schedule an earlier appointment if necessary.  Additionally, you may be receiving a survey about your experience at our office within a few days to 1 week by e-mail or mail. We value your feedback.  Saralyn Pilar, DO [facility], [facility]

## 2023-12-10 NOTE — Progress Notes (Signed)
Subjective:    Patient ID: Modestine Scherzinger, female    DOB: 10/03/1956, 68 y.o.   MRN: [id number]  Ruhama Lehew is a 68 y.o. female presenting on 12/10/2023 for Annual Exam   HPI  Discussed the use of AI scribe software for clinical note transcription with the patient, who gave verbal consent to proceed.  History of Present Illness   Alyssah Algeo is a 68 year old female with prediabetes and thyroid issues who presents for a follow-up visit.  She has been managing her prediabetes through diet and exercise, with HbA1c levels stable between 6.1 and 6.4 over the past five years. She attributes fluctuations to stress, particularly due to the recent loss of her husband and caring for her 97 year old mother with Alzheimer's. She is currently on phentermine for weight loss (prescribed by Endocrinology) having reduced her weight from 225 to 214 pounds, with a goal to reach 199 pounds by May.  Regarding her thyroid condition, she has a history of fluctuating thyroid levels. Six months ago, her levels were high at 13.5, leading to a reduction in her levothyroxine dosage from 175 mcg to 150 mcg, and then to 125 mcg. She does not feel well with the current dosage and is scheduled for a follow-up with her endocrinologist in May, which will include blood work and an ultrasound. - feels tired or fatigued now  Her colonoscopy and endoscopy history includes a procedure in 2020 where a hyperplastic polyp was found in the stomach, but no precancerous changes were noted. She has not yet completed the advised follow-up endoscopy at 1 year, was not scheduled. She is considering scheduling both upper and lower endoscopies as it has been five years since her last colonoscopy.  She has a history of anemia, but her recent hemoglobin level is 12.9, which is higher than previous levels and not concerning. She does not currently take iron supplements. Her white blood cell count was slightly low, which she attributes to a recent  respiratory illness.  She underwent a mammogram in January 2025 at [facility], which was reported as normal with no dense breasts, a change from her previous mammograms over the past 20 years. She is in the process of obtaining the official report for her records.  Her family history includes a father who died of a massive heart attack, a sister who died of a stroke, and another sister who takes daily heart medication. She is considering a heart scan due to this family history.  She reports a recent respiratory illness with lingering symptoms, including ear popping and fluid sensation, but denies any major current symptoms.      Health Maintenance:   negative mammogram screening 10/2023 try to obtain record      08/05/2022    4:08 PM 07/29/2022    2:52 PM 04/03/2021   11:42 AM  Depression screen PHQ 2/9  Decreased Interest 1 3 0  Down, Depressed, Hopeless 0 3 3  PHQ - 2 Score 1 6 3   Altered sleeping 2 3 0  Tired, decreased energy 1 2 3   Change in appetite 1 2 0  Feeling bad or failure about yourself  0 1 0  Trouble concentrating 1 0 0  Moving slowly or fidgety/restless 0 0 0  Suicidal thoughts 0 0 0  PHQ-9 Score 6 14 6   Difficult doing work/chores Not difficult at all Very difficult Somewhat difficult        No data to display  Past Medical History:  Diagnosis Date   Allergy    Anemia    Asthma    Hypothyroidism    Thyroid disease    hypothyroid   Past Surgical History:  Procedure Laterality Date   BREAST MASS EXCISION Left 1974   benign   COLONOSCOPY WITH PROPOFOL N/A 10/28/2019   Procedure: COLONOSCOPY WITH PROPOFOL;  Surgeon: Pasty Spillers, MD;  Location: [facility] ENDOSCOPY;  Service: Endoscopy;  Laterality: N/A;   ENDOSCOPIC MUCOSAL RESECTION N/A 11/22/2019   Procedure: ENDOSCOPIC MUCOSAL RESECTION;  Surgeon: Meridee Score Netty Starring., MD;  Location: [facility] ENDOSCOPY;  Service: Gastroenterology;  Laterality: N/A;   ESOPHAGOGASTRODUODENOSCOPY (EGD)  WITH PROPOFOL N/A 10/28/2019   Procedure: ESOPHAGOGASTRODUODENOSCOPY (EGD) WITH PROPOFOL;  Surgeon: Pasty Spillers, MD;  Location: [facility] ENDOSCOPY;  Service: Endoscopy;  Laterality: N/A;   ESOPHAGOGASTRODUODENOSCOPY (EGD) WITH PROPOFOL N/A 11/22/2019   Procedure: ESOPHAGOGASTRODUODENOSCOPY (EGD) WITH PROPOFOL;  Surgeon: Meridee Score Netty Starring., MD;  Location: [facility] ENDOSCOPY;  Service: Gastroenterology;  Laterality: N/A;   HEMOSTASIS CLIP PLACEMENT  11/22/2019   Procedure: HEMOSTASIS CLIP PLACEMENT;  Surgeon: Meridee Score Netty Starring., MD;  Location[facility] END[facility];  Service: Gastroenterology;;   HEMOSTASIS CONTROL  11/22/2019   Procedure: HEMOSTASIS CONTROL;  Surgeon: Lemar Lofty., MD;  Location: [facility] ENDOSCOPY;  Service: Gastroenterology;;  epi    nodule removed  1982   from thyroid -benign   PARATHYROIDECTOMY  1982   patient denies this dx   SUBMUCOSAL LIFTING INJECTION  11/22/2019   Procedure: SUBMUCOSAL LIFTING INJECTION;  Surgeon: Lemar Lofty., MD;  Location: [facility] ENDOSCOPY;  Service: Gastroenterology;;   TUBAL LIGATION     WISDOM TOOTH EXTRACTION     Social History   Socioeconomic History   Marital status: Widowed    Spouse name: Not on file   Number of children: 4   Years of education: Not on file   Highest education level: Not on file  Occupational History   Occupation: retired  Tobacco Use   Smoking status: Never   Smokeless tobacco: Never  Vaping Use   Vaping status: Never Used  Substance and Sexual Activity   Alcohol use: Never   Drug use: Never   Sexual activity: Not on file  Other Topics Concern   Not on file  Social History Narrative   Not on file   Social Drivers of Health   Financial Resource Strain: Medium Risk (07/30/2022)   Overall Financial Resource Strain (CARDIA)    Difficulty of Paying Living Expenses: Somewhat hard  Food Insecurity: No Food Insecurity (07/30/2022)   Hunger Vital Sign    Worried About Running Out of Food in the Last  Year: Never true    Ran Out of Food in the Last Year: Never true  Transportation Needs: No Transportation Needs (07/30/2022)   PRAPARE - Administrator, Civil Service (Medical): No    Lack of Transportation (Non-Medical): No  Physical Activity: Inactive (04/03/2021)   Exercise Vital Sign    Days of Exercise per Week: 0 days    Minutes of Exercise per Session: 0 min  Stress: Stress Concern Present (04/03/2021)   [employer] of Occupational Health - Occupational Stress Questionnaire    Feeling of Stress : To some extent  Social Connections: Socially Isolated (07/29/2022)   Social Connection and Isolation Panel [NHANES]    Frequency of Communication with Friends and Family: More than three times a week    Frequency of Social Gatherings with Friends and Family: Twice a week  Attends Religious Services: Never    Active Member of Clubs or Organizations: No    Attends Banker Meetings: Never    Marital Status: Widowed  Intimate Partner Violence: Not At Risk (07/29/2022)   Humiliation, Afraid, Rape, and Kick questionnaire    Fear of Current or Ex-Partner: No    Emotionally Abused: No    Physically Abused: No    Sexually Abused: No   Family History  Problem Relation Age of Onset   Cancer Mother        Breast, Paget's removed nipple 51, breast 2001   Colon polyps Mother    Paget's disease of bone Mother    Hypertension Mother    Hyperlipidemia Mother    Heart disease Father    Heart attack Father    Heart disease Maternal Grandfather    Stroke Paternal Grandmother    Colon cancer Neg Hx    Esophageal cancer Neg Hx    Inflammatory bowel disease Neg Hx    Liver disease Neg Hx    Pancreatic cancer Neg Hx    Rectal cancer Neg Hx    Stomach cancer Neg Hx    Current Outpatient Medications on File Prior to Visit  Medication Sig   fexofenadine (ALLEGRA) 180 MG tablet Take 180 mg by mouth daily as needed for allergies or rhinitis.   fluticasone (FLONASE) 50  MCG/ACT nasal spray PLACE 2 SPRAYS INTO BOTH NOSTRILS DAILY. USE FOR 4-6 WEEKS THEN STOP AND USE SEASONALLY OR AS NEEDED   ipratropium (ATROVENT) 0.06 % nasal spray Place 2 sprays into both nostrils 4 (four) times daily.   montelukast (SINGULAIR) 10 MG tablet TAKE 1 TABLET BY MOUTH EVERYDAY AT BEDTIME   nystatin cream (MYCOSTATIN) Apply 1 application  topically as needed.   phenazopyridine (PYRIDIUM) 200 MG tablet Take 1 tablet (200 mg total) by mouth 3 (three) times daily.   phentermine 37.5 MG capsule Take 37.5 mg by mouth every morning.   levothyroxine (SYNTHROID) 150 MCG tablet 150 mcg daily before breakfast.   No current facility-administered medications on file prior to visit.    Review of Systems  Constitutional:  Negative for activity change, appetite change, chills, diaphoresis, fatigue and fever.  HENT:  Negative for congestion and hearing loss.   Eyes:  Negative for visual disturbance.  Respiratory:  Negative for cough, chest tightness, shortness of breath and wheezing.   Cardiovascular:  Negative for chest pain, palpitations and leg swelling.  Gastrointestinal:  Negative for abdominal pain, constipation, diarrhea, nausea and vomiting.  Genitourinary:  Negative for dysuria, frequency and hematuria.  Musculoskeletal:  Negative for arthralgias and neck pain.  Skin:  Negative for rash.  Neurological:  Negative for dizziness, weakness, light-headedness, numbness and headaches.  Hematological:  Negative for adenopathy.  Psychiatric/Behavioral:  Negative for behavioral problems, dysphoric mood and sleep disturbance.    Per HPI unless specifically indicated above     Objective:    BP 132/78   Pulse 69   Ht 5\' 8"  (1.727 m)   Wt 214 lb (97.1 kg)   LMP  (LMP Unknown)   SpO2 99%   BMI 32.54 kg/m   Wt Readings from Last 3 Encounters:  12/10/23 214 lb (97.1 kg)  08/24/23 220 lb 14.4 oz (100.2 kg)  03/23/23 221 lb (100.2 kg)    Physical Exam Vitals and nursing note reviewed.   Constitutional:      General: She is not in acute distress.    Appearance: She is well-developed. She is  not diaphoretic.     Comments: Well-appearing, comfortable, cooperative  HENT:     Head: Normocephalic and atraumatic.     Right Ear: Ear canal and external ear normal. There is impacted cerumen.     Left Ear: Ear canal and external ear normal. There is impacted cerumen.  Eyes:     General:        Right eye: No discharge.        Left eye: No discharge.     Conjunctiva/sclera: Conjunctivae normal.     Pupils: Pupils are equal, round, and reactive to light.  Neck:     Thyroid: No thyromegaly.     Vascular: No carotid bruit.  Cardiovascular:     Rate and Rhythm: Normal rate and regular rhythm.     Pulses: Normal pulses.     Heart sounds: Normal heart sounds. No murmur heard. Pulmonary:     Effort: Pulmonary effort is normal. No respiratory distress.     Breath sounds: Normal breath sounds. No wheezing or rales.  Abdominal:     General: Bowel sounds are normal. There is no distension.     Palpations: Abdomen is soft. There is no mass.     Tenderness: There is no abdominal tenderness.  Musculoskeletal:        General: No tenderness. Normal range of motion.     Cervical back: Normal range of motion and neck supple.     Right lower leg: No edema.     Left lower leg: No edema.     Comments: Upper / Lower Extremities: - Normal muscle tone, strength bilateral upper extremities 5/5, lower extremities 5/5  Lymphadenopathy:     Cervical: No cervical adenopathy.  Skin:    General: Skin is warm and dry.     Findings: No erythema or rash.  Neurological:     Mental Status: She is alert and oriented to person, place, and time.     Comments: Distal sensation intact to light touch all extremities  Psychiatric:        Mood and Affect: Mood normal.        Behavior: Behavior normal.        Thought Content: Thought content normal.     Comments: Well groomed, good eye contact, normal  speech and thoughts     Results for orders placed or performed in visit on 12/02/23  Lipid panel   Collection Time: 12/03/23 10:03 AM  Result Value Ref Range   Cholesterol 194 <200 mg/dL   HDL 48 (L) > OR = 50 mg/dL   Triglycerides 130 <865 mg/dL   LDL Cholesterol (Calc) 124 (H) mg/dL (calc)   Total CHOL/HDL Ratio 4.0 <5.0 (calc)   Non-HDL Cholesterol (Calc) 146 (H) <130 mg/dL (calc)  Hemoglobin H8I   Collection Time: 12/03/23 10:03 AM  Result Value Ref Range   Hgb A1c MFr Bld 6.4 (H) <5.7 % of total Hgb   Mean Plasma Glucose 137 mg/dL   eAG (mmol/L) 7.6 mmol/L  COMPLETE METABOLIC PANEL WITH GFR   Collection Time: 12/03/23 10:03 AM  Result Value Ref Range   Glucose, Bld 111 (H) 65 - 99 mg/dL   BUN 12 7 - 25 mg/dL   Creat 6.96 2.95 - 2.84 mg/dL   eGFR 81 > OR = 60 XL/KGM/0.10U7   BUN/Creatinine Ratio SEE NOTE: 6 - 22 (calc)   Sodium 143 135 - 146 mmol/L   Potassium 3.9 3.5 - 5.3 mmol/L   Chloride 107 98 - 110 mmol/L  CO2 28 20 - 32 mmol/L   Calcium 9.0 8.6 - 10.4 mg/dL   Total Protein 7.2 6.1 - 8.1 g/dL   Albumin 3.9 3.6 - 5.1 g/dL   Globulin 3.3 1.9 - 3.7 g/dL (calc)   AG Ratio 1.2 1.0 - 2.5 (calc)   Total Bilirubin 0.4 0.2 - 1.2 mg/dL   Alkaline phosphatase (APISO) 73 37 - 153 U/L   AST 23 10 - 35 U/L   ALT 14 6 - 29 U/L  CBC with Differential/Platelet   Collection Time: 12/03/23 10:03 AM  Result Value Ref Range   WBC 2.4 (L) 3.8 - 10.8 Thousand/uL   RBC 4.77 3.80 - 5.10 Million/uL   Hemoglobin 12.9 11.7 - 15.5 g/dL   HCT 16.1 09.6 - 04.5 %   MCV 83.0 80.0 - 100.0 fL   MCH 27.0 27.0 - 33.0 pg   MCHC 32.6 32.0 - 36.0 g/dL   RDW 40.9 81.1 - 91.4 %   Platelets 233 140 - 400 Thousand/uL   MPV 10.8 7.5 - 12.5 fL   Neutro Abs 818 (L) 1,500 - 7,800 cells/uL   Absolute Lymphocytes 1,320 850 - 3,900 cells/uL   Absolute Monocytes 242 200 - 950 cells/uL   Eosinophils Absolute 10 (L) 15 - 500 cells/uL   Basophils Absolute 10 0 - 200 cells/uL   Neutrophils Relative %  34.1 %   Total Lymphocyte 55.0 %   Monocytes Relative 10.1 %   Eosinophils Relative 0.4 %   Basophils Relative 0.4 %  VITAMIN D 25 Hydroxy (Vit-D Deficiency, Fractures)   Collection Time: 12/03/23 10:03 AM  Result Value Ref Range   Vit D, 25-Hydroxy 16 (L) 30 - 100 ng/mL      Assessment & Plan:   Problem List Items Addressed This Visit     Elevated BP without diagnosis of hypertension   Gastric polyp   Relevant Orders   Ambulatory referral to Gastroenterology   Gastroesophageal reflux disease without esophagitis   Relevant Orders   Ambulatory referral to Gastroenterology   Hiatal hernia   Relevant Orders   Ambulatory referral to Gastroenterology   Iron deficiency anemia   Obesity (BMI 30.0-34.9)   Relevant Orders   CT CARDIAC SCORING (SELF PAY ONLY)   Pre-diabetes   Relevant Orders   CT CARDIAC SCORING (SELF PAY ONLY)   Pure hypercholesterolemia   Relevant Orders   CT CARDIAC SCORING (SELF PAY ONLY)   Vitamin D deficiency   Relevant Medications   Cholecalciferol (VITAMIN D3) 125 MCG (5000 UT) CAPS   Other Visit Diagnoses       Annual physical exam    -  Primary     Dysfunction of both eustachian tubes            Updated Health Maintenance information Reviewed recent lab results with patient Encouraged improvement to lifestyle with diet and exercise Goal of weight loss   Gastrointestinal Health Last 2021 upper endoscopy revealed a hyperplastic polyp in the stomach and inflammation in the esophagus due to acid reflux. No evidence of Barrett's esophagus or precancerous changes.  Repeat EGD 1 yr did not happen Colonoscopy was clear no biopsy repeat 10 yr, 2030 or sooner if indicated. -Refer to Dr. Jarome Matin [facility] for repeat upper endoscopy +/- Colonoscopy if indicated  Prediabetes A1c 6.4, past trend 6.1-6.3 Patient is actively working on lifestyle modifications including diet and exercise. Weight loss noted. -Continue current lifestyle  modifications. -Consider metformin or weight loss injections if sugar levels continue to  rise. -Check finger prick sugar in 6 months. A1c  Hypothyroidism Followed by Dr Gershon Crane [facility] Last dose adjust 150 > 125 mcg levothyroxine Patient reports struggling with energy levels since decrease in thyroid medication. Next blood panel and ultrasound scheduled in May. -Continue current thyroid medication regimen. She can follow up with Endocrine to adjust sooner if needed or wait until May  Vitamin D Deficiency Recent blood work showed slightly low Vitamin D levels. -Start OTC Vitamin D3 5000 units daily for 12 weeks, then decrease to 2000 units daily.  [facility] Recent mammogram showed no dense breasts or signs of cancer. -Obtain record of recent mammogram for confirmation. She will bring copy or we can fax Upcoming DEXA scan  Eustachian Tube Dysfunction / Ear Complaints Patient reports feeling of fluid in ears, possibly due to recent respiratory illness. -Recommend over-the-counter nasal steroid (Flonase) to help drain fluid.  General Health Maintenance -Continue taking B vitamins. Order Coronary Calcium CT Scan cardiovascular risk stratification -Schedule bone density scan on 12/25/2023. -Schedule follow-up appointment on 06/10/2024.        Orders Placed This Encounter  Procedures   CT CARDIAC SCORING (SELF PAY ONLY)    Standing Status:   Future    Expiration Date:   12/09/2024    Preferred imaging location?:   [facility]   Ambulatory referral to Gastroenterology    Referral Priority:   Routine    Referral Type:   Consultation    Referral Reason:   Specialty Services Required    Number of Visits Requested:   1    Meds ordered this encounter  Medications   Cholecalciferol (VITAMIN D3) 125 MCG (5000 UT) CAPS    Sig: Take 1 capsule (5,000 Units total) by mouth daily. For 8 weeks, then start Vitamin D3 2,000 units daily (OTC)    Dispense:  90 capsule      Follow up plan: Return in about 6 months (around 06/08/2024) for 6 month PreDM A1c.  Saralyn Pilar, DO [facility] 12/10/2023, 3:14 PM

## 2023-12-25 LAB — HM DEXA SCAN: HM Dexa Scan: NORMAL

## 2023-12-27 DIAGNOSIS — R911 Solitary pulmonary nodule: Secondary | ICD-10-CM

## 2023-12-27 HISTORY — DX: Solitary pulmonary nodule: R91.1

## 2024-01-02 ENCOUNTER — Ambulatory Visit
Admission: RE | Admit: 2024-01-02 | Discharge: 2024-01-02 | Disposition: A | Discharge status: Home / Self Care | Payer: Self-pay | Source: Ambulatory Visit | Attending: Family Medicine | Admitting: Family Medicine

## 2024-01-02 DIAGNOSIS — E66811 Obesity, class 1: Secondary | ICD-10-CM | POA: Insufficient documentation

## 2024-01-02 DIAGNOSIS — E7800 Pure hypercholesterolemia, unspecified: Secondary | ICD-10-CM | POA: Insufficient documentation

## 2024-01-02 DIAGNOSIS — R7303 Prediabetes: Secondary | ICD-10-CM | POA: Insufficient documentation

## 2024-01-05 ENCOUNTER — Encounter: Payer: Self-pay | Admitting: Family Medicine

## 2024-01-19 ENCOUNTER — Encounter: Payer: Self-pay | Admitting: Oncology

## 2024-01-19 ENCOUNTER — Inpatient Hospital Stay: Attending: Oncology | Admitting: Oncology

## 2024-01-19 ENCOUNTER — Inpatient Hospital Stay

## 2024-01-19 VITALS — BP 142/79 | HR 88 | Temp 97.1°F | Resp 18 | Wt 217.5 lb

## 2024-01-19 DIAGNOSIS — D508 Other iron deficiency anemias: Secondary | ICD-10-CM

## 2024-01-19 DIAGNOSIS — R918 Other nonspecific abnormal finding of lung field: Secondary | ICD-10-CM | POA: Diagnosis not present

## 2024-01-19 DIAGNOSIS — D509 Iron deficiency anemia, unspecified: Secondary | ICD-10-CM | POA: Insufficient documentation

## 2024-01-19 DIAGNOSIS — R911 Solitary pulmonary nodule: Secondary | ICD-10-CM | POA: Insufficient documentation

## 2024-01-19 DIAGNOSIS — Z803 Family history of malignant neoplasm of breast: Secondary | ICD-10-CM | POA: Insufficient documentation

## 2024-01-19 LAB — CMP (CANCER CENTER ONLY)
ALT: 18 U/L (ref 0–44)
AST: 22 U/L (ref 15–41)
Albumin: 3.9 g/dL (ref 3.5–5.0)
Alkaline Phosphatase: 72 U/L (ref 38–126)
Anion gap: 8 (ref 5–15)
BUN: 16 mg/dL (ref 8–23)
CO2: 24 mmol/L (ref 22–32)
Calcium: 8.7 mg/dL — ABNORMAL LOW (ref 8.9–10.3)
Chloride: 104 mmol/L (ref 98–111)
Creatinine: 0.83 mg/dL (ref 0.44–1.00)
GFR, Estimated: >60 mL/min (ref >60)
Glucose, Bld: 85 mg/dL (ref 70–99)
Potassium: 4 mmol/L (ref 3.5–5.1)
Sodium: 136 mmol/L (ref 135–145)
Total Bilirubin: 0.5 mg/dL (ref 0.0–1.2)
Total Protein: 7.7 g/dL (ref 6.5–8.1)

## 2024-01-19 LAB — CBC WITH DIFFERENTIAL (CANCER CENTER ONLY)
Abs Immature Granulocytes: 0 10*3/uL (ref 0.00–0.07)
Basophils Absolute: 0 10*3/uL (ref 0.0–0.1)
Basophils Relative: 1 %
Eosinophils Absolute: 0 10*3/uL (ref 0.0–0.5)
Eosinophils Relative: 1 %
HCT: 40.4 % (ref 36.0–46.0)
Hemoglobin: 12.8 g/dL (ref 12.0–15.0)
Immature Granulocytes: 0 %
Lymphocytes Relative: 44 %
Lymphs Abs: 1.9 10*3/uL (ref 0.7–4.0)
MCH: 26.9 pg (ref 26.0–34.0)
MCHC: 31.7 g/dL (ref 30.0–36.0)
MCV: 85.1 fL (ref 80.0–100.0)
Monocytes Absolute: 0.4 10*3/uL (ref 0.1–1.0)
Monocytes Relative: 9 %
Neutro Abs: 2 10*3/uL (ref 1.7–7.7)
Neutrophils Relative %: 45 %
Platelet Count: 282 10*3/uL (ref 150–400)
RBC: 4.75 MIL/uL (ref 3.87–5.11)
RDW: 14.6 % (ref 11.5–15.5)
WBC Count: 4.3 10*3/uL (ref 4.0–10.5)
nRBC: 0 % (ref 0.0–0.2)

## 2024-01-20 DIAGNOSIS — R918 Other nonspecific abnormal finding of lung field: Secondary | ICD-10-CM | POA: Insufficient documentation

## 2024-01-20 DIAGNOSIS — C3492 Malignant neoplasm of unspecified part of left bronchus or lung: Secondary | ICD-10-CM | POA: Insufficient documentation

## 2024-01-20 DIAGNOSIS — R911 Solitary pulmonary nodule: Secondary | ICD-10-CM | POA: Insufficient documentation

## 2024-01-20 NOTE — Assessment & Plan Note (Addendum)
 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   Retrosternal anterior mediastinum soft tissue density. Recommend PET scan for further evaluation. Refer to pulmonology for biopsy of this lung mass with no other more accessible biopsy target found on PET scan.

## 2024-01-20 NOTE — Progress Notes (Signed)
 Hematology/Oncology Progress note [phone] [phone]      Patient Care Team: Smitty Cords, DO as PCP - General (Family Medicine) Rickard Patience, MD as Consulting Physician (Oncology)  REFERRING PROVIDER: Saralyn Pilar * CHIEF COMPLAINTS/REASON FOR VISIT:  Lung mass   ASSESSMENT & PLAN:   Lung mass Left lower lobe lung mass, 2.7 x 2.7 cm, suspicious for malignancy.  Retrosternal anterior mediastinum soft tissue density. Recommend PET scan for further evaluation. Refer to pulmonology for biopsy of this lung mass with no other more accessible biopsy target found on PET scan.  Iron deficiency anemia History of iron deficiency anemia.  Resolved  hemoglobin is normal today.   Orders Placed This Encounter  Procedures   NM PET Image Initial (PI) Skull Base To Thigh    ASAP    Standing Status:   Future    Expected Date:   01/20/2024    Expiration Date:   01/18/2025    If indicated for the ordered procedure, I authorize the administration of a radiopharmaceutical per Radiology protocol:   Yes    Preferred imaging location?:   [facility]   CBC with Differential (Cancer Center Only)    Standing Status:   Future    Number of Occurrences:   1    Expected Date:   01/19/2024    Expiration Date:   01/18/2025   CMP (Cancer Center only)    Standing Status:   Future    Number of Occurrences:   1    Expected Date:   01/19/2024    Expiration Date:   01/18/2025   Ambulatory referral to Pulmonology    Referral Priority:   Routine    Referral Type:   Consultation    Referral Reason:   Specialty Services Required    Referred to Provider:   Janann Colonel, MD    Requested Specialty:   Pulmonary Disease    Number of Visits Requested:   1   Follow-up to be determined All questions were answered. The patient knows to call the clinic with any problems, questions or concerns.  Rickard Patience, MD, PhD [facility] Hematology Oncology 01/19/2024      INTERVAL HISTORY Amanda Pierce is a 68 y.o. female who has above history reviewed by me today presents to establish care for lung mass evaluation. Patient has a history of iron deficiency anemia and was previously seen by me.  01/16/2024 patient had a CT cardiac scoring test done  *Lobular spiculated aggressively appearing mass in the left lower lobe measuring 2.7 x 2.7 cm. Malignancy should be excluded. Correlation with PET-CT imaging and or tissue sampling is recommended. *Within the first image of the study there is a lobular soft tissue density or mass that measures 2.6 x 2.6 cm. Projects along the retrosternal anterior mediastinal space, superior to the arch, this could represent the lower most portion of the thyroid mass and correlation with a complete CT of the chest with supine to the lower neck is recommended. Ideally with IV contrast  Patient does not smoke.  Denies night sweats, fever, cough, chest pain, hemoptysis.  She has intentionally lost about 13 pounds weight over the past year. She  Review of Systems  Constitutional:  Negative for appetite change, chills, fatigue and fever.  HENT:   Negative for hearing loss and voice change.   Eyes:  Negative for eye problems.  Respiratory:  Negative for chest tightness and cough.   Cardiovascular:  Negative for chest pain.  Gastrointestinal:  Negative for abdominal distention, abdominal pain, blood in stool and constipation.  Endocrine: Negative for hot flashes.  Genitourinary:  Negative for difficulty urinating and frequency.   Musculoskeletal:  Negative for arthralgias.  Skin:  Negative for itching and rash.  Neurological:  Negative for extremity weakness.  Hematological:  Negative for adenopathy.  Psychiatric/Behavioral:  Negative for confusion.     MEDICAL HISTORY:  Past Medical History:  Diagnosis Date   Allergy    Anemia    Asthma    Hypothyroidism    Thyroid disease    hypothyroid    SURGICAL  HISTORY: Past Surgical History:  Procedure Laterality Date   BREAST MASS EXCISION Left 1974   benign   COLONOSCOPY WITH PROPOFOL N/A 10/28/2019   Procedure: COLONOSCOPY WITH PROPOFOL;  Surgeon: Pasty Spillers, MD;  Location: [facility] ENDOSCOPY;  Service: Endoscopy;  Laterality: N/A;   ENDOSCOPIC MUCOSAL RESECTION N/A 11/22/2019   Procedure: ENDOSCOPIC MUCOSAL RESECTION;  Surgeon: Meridee Score Netty Starring., MD;  Location: [facility] ENDOSCOPY;  Service: Gastroenterology;  Laterality: N/A;   ESOPHAGOGASTRODUODENOSCOPY (EGD) WITH PROPOFOL N/A 10/28/2019   Procedure: ESOPHAGOGASTRODUODENOSCOPY (EGD) WITH PROPOFOL;  Surgeon: Pasty Spillers, MD;  Location: [facility] ENDOSCOPY;  Service: Endoscopy;  Laterality: N/A;   ESOPHAGOGASTRODUODENOSCOPY (EGD) WITH PROPOFOL N/A 11/22/2019   Procedure: ESOPHAGOGASTRODUODENOSCOPY (EGD) WITH PROPOFOL;  Surgeon: Meridee Score Netty Starring., MD;  Location: [facility] ENDOSCOPY;  Service: Gastroenterology;  Laterality: N/A;   HEMOSTASIS CLIP PLACEMENT  11/22/2019   Procedure: HEMOSTASIS CLIP PLACEMENT;  Surgeon: Meridee Score Netty Starring., MD;  Location: [facility] ENDOSCOPY;  Service: Gastroenterology;;   HEMOSTASIS CONTROL  11/22/2019   Procedure: HEMOSTASIS CONTROL;  Surgeon: Lemar Lofty., MD;  Location: [facility] ENDOSCOPY;  Service: Gastroenterology;;  epi    nodule removed  1982   from thyroid -benign   PARATHYROIDECTOMY  1982   patient denies this dx   SUBMUCOSAL LIFTING INJECTION  11/22/2019   Procedure: SUBMUCOSAL LIFTING INJECTION;  Surgeon: Lemar Lofty., MD;  Location: [facility] ENDOSCOPY;  Service: Gastroenterology;;   TUBAL LIGATION     WISDOM TOOTH EXTRACTION      SOCIAL HISTORY: Social History   Socioeconomic History   Marital status: Widowed    Spouse name: Not on file   Number of children: 4   Years of education: Not on file   Highest education level: Not on file  Occupational History   Occupation: retired  Tobacco Use   Smoking status: Never   Smokeless  tobacco: Never  Vaping Use   Vaping status: Never Used  Substance and Sexual Activity   Alcohol use: Never   Drug use: Never   Sexual activity: Not on file  Other Topics Concern   Not on file  Social History Narrative   Not on file   Social Drivers of Health   Financial Resource Strain: Medium Risk (07/30/2022)   Overall Financial Resource Strain (CARDIA)    Difficulty of Paying Living Expenses: Somewhat hard  Food Insecurity: No Food Insecurity (07/30/2022)   Hunger Vital Sign    Worried About Running Out of Food in the Last Year: Never true    Ran Out of Food in the Last Year: Never true  Transportation Needs: No Transportation Needs (07/30/2022)   PRAPARE - Administrator, Civil Service (Medical): No    Lack of Transportation (Non-Medical): No  Physical Activity: Inactive (04/03/2021)   Exercise Vital Sign    Days of Exercise per Week: 0 days    Minutes of Exercise per Session: 0 min  Stress: Stress Concern Present (04/03/2021)   [employer] of Occupational Health - Occupational Stress Questionnaire    Feeling of Stress : To some extent  Social Connections: Socially Isolated (07/29/2022)   Social Connection and Isolation Panel [NHANES]    Frequency of Communication with Friends and Family: More than three times a week    Frequency of Social Gatherings with Friends and Family: Twice a week    Attends Religious Services: Never    Database administrator or Organizations: No    Attends Banker Meetings: Never    Marital Status: Widowed  Intimate Partner Violence: Not At Risk (07/29/2022)   Humiliation, Afraid, Rape, and Kick questionnaire    Fear of Current or Ex-Partner: No    Emotionally Abused: No    Physically Abused: No    Sexually Abused: No    FAMILY HISTORY: Family History  Problem Relation Age of Onset   Cancer Mother        Breast, Paget's removed nipple 57, breast 2001   Colon polyps Mother    Paget's disease of bone Mother     Hypertension Mother    Hyperlipidemia Mother    Heart disease Father    Heart attack Father    Heart disease Maternal Grandfather    Stroke Paternal Grandmother    Colon cancer Neg Hx    Esophageal cancer Neg Hx    Inflammatory bowel disease Neg Hx    Liver disease Neg Hx    Pancreatic cancer Neg Hx    Rectal cancer Neg Hx    Stomach cancer Neg Hx     ALLERGIES:  is allergic to aspirin, contrast media [iodinated contrast media], elemental sulfur, ibuprofen, latex, penicillins, rubbing alcohol [alcohol], adhesive [tape], medical adhesive remover, and sulfa antibiotics.  MEDICATIONS:  Current Outpatient Medications  Medication Sig Dispense Refill   Cholecalciferol (VITAMIN D3) 125 MCG (5000 UT) CAPS Take 1 capsule (5,000 Units total) by mouth daily. For 8 weeks, then start Vitamin D3 2,000 units daily (OTC) 90 capsule    fexofenadine (ALLEGRA) 180 MG tablet Take 180 mg by mouth daily as needed for allergies or rhinitis.     fluticasone (FLONASE) 50 MCG/ACT nasal spray PLACE 2 SPRAYS INTO BOTH NOSTRILS DAILY. USE FOR 4-6 WEEKS THEN STOP AND USE SEASONALLY OR AS NEEDED 48 mL 0   ipratropium (ATROVENT) 0.06 % nasal spray Place 2 sprays into both nostrils 4 (four) times daily. 15 mL 12   levothyroxine (SYNTHROID) 150 MCG tablet 150 mcg daily before breakfast.     montelukast (SINGULAIR) 10 MG tablet TAKE 1 TABLET BY MOUTH EVERYDAY AT BEDTIME 30 tablet 0   nystatin cream (MYCOSTATIN) Apply 1 application  topically as needed.     phenazopyridine (PYRIDIUM) 200 MG tablet Take 1 tablet (200 mg total) by mouth 3 (three) times daily. 6 tablet 0   phentermine 37.5 MG capsule Take 37.5 mg by mouth every morning.     No current facility-administered medications for this visit.     PHYSICAL EXAMINATION: ECOG PERFORMANCE STATUS: 0 - Asymptomatic   Vitals:   01/19/24 1340  BP: (!) 142/79  Pulse: 88  Resp: 18  Temp: (!) 97.1 F (36.2 C)  SpO2: 98%   Filed Weights   01/19/24 1340  Weight:  217 lb 8 oz (98.7 kg)    Physical Exam Constitutional:      General: She is not in acute distress. HENT:     Head: Normocephalic and atraumatic.  Eyes:  General: No scleral icterus.    Pupils: Pupils are equal, round, and reactive to light.  Cardiovascular:     Rate and Rhythm: Normal rate and regular rhythm.     Heart sounds: Normal heart sounds.  Pulmonary:     Effort: Pulmonary effort is normal. No respiratory distress.     Breath sounds: No wheezing.  Abdominal:     General: Bowel sounds are normal. There is no distension.     Palpations: Abdomen is soft. There is no mass.     Tenderness: There is no abdominal tenderness.  Musculoskeletal:        General: No deformity. Normal range of motion.     Cervical back: Normal range of motion and neck supple.  Skin:    General: Skin is warm and dry.     Findings: No erythema or rash.  Neurological:     Mental Status: She is alert and oriented to person, place, and time. Mental status is at baseline.     Cranial Nerves: No cranial nerve deficit.     Coordination: Coordination normal.  Psychiatric:        Mood and Affect: Mood normal.        Behavior: Behavior normal.        Thought Content: Thought content normal.      LABORATORY DATA:  I have reviewed the data as listed Lab Results  Component Value Date   WBC 4.3 01/19/2024   HGB 12.8 01/19/2024   HCT 40.4 01/19/2024   MCV 85.1 01/19/2024   PLT 282 01/19/2024   Recent Labs    12/03/23 1003 01/19/24 1454  NA 143 136  K 3.9 4.0  CL 107 104  CO2 28 24  GLUCOSE 111* 85  BUN 12 16  CREATININE 0.79 0.83  CALCIUM 9.0 8.7*  GFRNONAA  --  >60  PROT 7.2 7.7  ALBUMIN  --  3.9  AST 23 22  ALT 14 18  ALKPHOS  --  72  BILITOT 0.4 0.5   Iron/TIBC/Ferritin/ %Sat    Component Value Date/Time   IRON 55 12/10/2021 1412   TIBC 337 12/10/2021 1412   FERRITIN 25 12/10/2021 1412   IRONPCTSAT 16 12/10/2021 1412   IRONPCTSAT 9 (L) 10/11/2019 0940     CT CARDIAC  SCORING (SELF PAY ONLY) Addendum Date: 01/16/2024 ADDENDUM REPORT: 01/16/2024 08:10 ADDENDUM: OVER-READ INTERPRETATION  CT CHEST The following report is an over-read performed by radiologist Dr. Rowe [facility] on 01/16/2024. This over-read does not include interpretation of cardiac or coronary anatomy or pathology. The interpretation by the cardiologist is attached. COMPARISON:  None. FINDINGS: Within the first image of the study there is a lobular soft tissue density or mass that measures 2.6 x 2.6 cm. Projects along the retrosternal anterior mediastinal space, superior to the arch, this could represent the the lower most portion of the thyroid mass and correlation with a complete CT of the chest with supine to the lower neck is recommended. Ideally with IV contrast. No other masses or adenopathy. No significant calcifications Lung windows demonstrates a lobular spiculated aggressively appearing mass in the left lower lobe measuring 2.7 x 2.7 cm. Malignancy should be excluded. Correlation with PET-CT imaging and or tissue sampling is recommended. IMPRESSION: *Lobular spiculated aggressively appearing mass in the left lower lobe measuring 2.7 x 2.7 cm. Malignancy should be excluded. Correlation with PET-CT imaging and or tissue sampling is recommended. *Within the first image of the study there is a lobular  soft tissue density or mass that measures 2.6 x 2.6 cm. Projects along the retrosternal anterior mediastinal space, superior to the arch, this could represent the lower most portion of the thyroid mass and correlation with a complete CT of the chest with supine to the lower neck is recommended. Ideally with IV contrast. Electronically Signed   By: Shaaron Adler M.D.   On: 01/16/2024 08:10   Result Date: 01/16/2024 CLINICAL DATA:  Risk stratification EXAM: Coronary Calcium Score TECHNIQUE: The patient was scanned on a Siemens Somatom scanner. Axial non-contrast 3 mm slices were carried  out through the heart. The data set was analyzed on a dedicated work station and scored using the Agatson method. FINDINGS: Non-cardiac: See separate report from [facility]. Ascending Aorta: Normal size Pericardium: Normal Coronary arteries: Normal origin of left and right coronary arteries. Distribution of arterial calcifications if present, as noted below; LM 0 LAD 0 LCx 0 RCA 0 Total 0 IMPRESSION AND RECOMMENDATION: 1. Coronary calcium score of 0. 2. CAC 0, CAC-DRS A0. 3. Continue heart healthy lifestyle and risk factor modification. Electronically Signed: By: Debbe Odea M.D. On: 01/02/2024 16:16

## 2024-01-20 NOTE — Assessment & Plan Note (Signed)
 History of iron deficiency anemia.  Resolved  hemoglobin is normal today.

## 2024-01-26 ENCOUNTER — Ambulatory Visit
Admission: RE | Admit: 2024-01-26 | Discharge: 2024-01-26 | Disposition: A | Discharge status: Home / Self Care | Source: Ambulatory Visit | Attending: Oncology | Admitting: Oncology

## 2024-01-26 DIAGNOSIS — R918 Other nonspecific abnormal finding of lung field: Secondary | ICD-10-CM | POA: Diagnosis present

## 2024-01-26 DIAGNOSIS — E049 Nontoxic goiter, unspecified: Secondary | ICD-10-CM | POA: Insufficient documentation

## 2024-01-26 LAB — GLUCOSE, CAPILLARY: Glucose-Capillary: 112 mg/dL — ABNORMAL HIGH (ref 70–99)

## 2024-01-26 MED ORDER — FLUDEOXYGLUCOSE F - 18 (FDG) INJECTION
11.5200 | Freq: Once | INTRAVENOUS | Status: AC | PRN
  Administered 2024-01-26: 11.52 via INTRAVENOUS

## 2024-01-27 ENCOUNTER — Telehealth: Payer: Self-pay

## 2024-01-27 ENCOUNTER — Encounter: Payer: Self-pay | Admitting: Pulmonary Disease

## 2024-01-27 ENCOUNTER — Ambulatory Visit: Admitting: Pulmonary Disease

## 2024-01-27 VITALS — BP 122/76 | HR 71 | Temp 96.9°F | Ht 68.0 in | Wt 216.6 lb

## 2024-01-27 DIAGNOSIS — E039 Hypothyroidism, unspecified: Secondary | ICD-10-CM

## 2024-01-27 DIAGNOSIS — K219 Gastro-esophageal reflux disease without esophagitis: Secondary | ICD-10-CM

## 2024-01-27 DIAGNOSIS — R911 Solitary pulmonary nodule: Secondary | ICD-10-CM

## 2024-01-27 NOTE — Progress Notes (Signed)
 Subjective:    Patient ID: Amanda Pierce, female    DOB: October 23, 1956, 68 y.o.   MRN: [id number]  Patient Care Team: Smitty Cords, DO as PCP - General (Family Medicine) Rickard Patience, MD as Consulting Physician (Oncology)  Chief Complaint  Patient presents with   Consult    Nodule. No SOB, wheezing or cough.    BACKGROUND: Patient is a 68 year old lifelong never smoker with a history of iron deficiency anemia, who presents for evaluation of a lung nodule/mass noted on the left lower lobe incidentally found during cardiac CT.  She is kindly referred by Dr.Zhou Cathie Hoops.  HPI Discussed the use of AI scribe software for clinical note transcription with the patient, who gave verbal consent to proceed.  History of Present Illness   Amanda Pierce is a 68 year old female who presents for evaluation of a lung lesion found on cardiac CT.  A lung lesion was incidentally discovered during a cardiac CT scan. The lesion is located on the left lower lobe of the lung, it measures 2.7 x 2.7 cm. She has no history of smoking and has not experienced any recent cough or bronchitis symptoms. She has a history of bronchitis and asthma, which is managed by Dr. Althea Charon. A subsequent PET scan showed some activity in the area, though it has not been fully interpreted by a radiologist. The lesion has not changed in size over the past three weeks.  She has not noticed any fevers, chills or sweats.  No cough or sputum production.  No hemoptysis.  No weight loss or anorexia.  She does not endorse any shortness of breath.  No orthopnea or paroxysmal nocturnal dyspnea.  No lower extremity edema.  She experiences soreness on the right side, particularly when lying on her right side or drinking liquids. The pain started under her breast and moved to the current location. She stopped wearing an underwire bra, which alleviated some discomfort.  She has a history of a left thyroidectomy performed in 1981 and has been  undergoing regular thyroid ultrasounds on the right since 2021, having these yearly until 2023. She recalls being told she had a suppressed goiter over twenty years ago.  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 She lives alone in [place] after her husband's passing nearly two years ago, with family residing in [place]. She has not traveled outside the country since 2002 and has not been out west.     Review of Systems A 10 point review of systems was performed and it is as noted above otherwise negative.   Past Medical History:  Diagnosis Date   Allergy    Anemia    Asthma    Hypothyroidism    Thyroid disease    hypothyroid    Past Surgical History:  Procedure Laterality Date   BREAST MASS EXCISION Left 1974   benign   COLONOSCOPY WITH PROPOFOL N/A 10/28/2019   Procedure: COLONOSCOPY WITH PROPOFOL;  Surgeon: Pasty Spillers, MD;  Location: [facility] ENDOSCOPY;  Service: Endoscopy;  Laterality: N/A;   ENDOSCOPIC MUCOSAL RESECTION N/A 11/22/2019   Procedure: ENDOSCOPIC MUCOSAL RESECTION;  Surgeon: Meridee Score Netty Starring., MD;  Location: [facility] ENDOSCOPY;  Service: Gastroenterology;  Laterality: N/A;   ESOPHAGOGASTRODUODENOSCOPY (EGD) WITH PROPOFOL N/A 10/28/2019   Procedure: ESOPHAGOGASTRODUODENOSCOPY (EGD) WITH PROPOFOL;  Surgeon: Pasty Spillers, MD;  Location: [facility] ENDOSCOPY;  Service: Endoscopy;  Laterality: N/A;   ESOPHAGOGASTRODUODENOSCOPY (EGD) WITH PROPOFOL N/A 11/22/2019   Procedure:  ESOPHAGOGASTRODUODENOSCOPY (EGD) WITH PROPOFOL;  Surgeon: Mansouraty, Netty Starring., MD;  Location: [facility] ENDOSCOPY;  Service: Gastroenterology;  Laterality: N/A;   HEMOSTASIS CLIP PLACEMENT  11/22/2019   Procedure: HEMOSTASIS CLIP PLACEMENT;  Surgeon: Lemar Lofty., MD;  Location: [facility] ENDOSCOPY;  Service: Gastroenterology;;   HEMOSTASIS CONTROL  11/22/2019   Procedure: HEMOSTASIS CONTROL;  Surgeon: Lemar Lofty.,  MD;  Location: [facility] ENDOSCOPY;  Service: Gastroenterology;;  epi    nodule removed  1982   from thyroid -benign   PARATHYROIDECTOMY  1982   patient denies this dx   SUBMUCOSAL LIFTING INJECTION  11/22/2019   Procedure: SUBMUCOSAL LIFTING INJECTION;  Surgeon: Lemar Lofty., MD;  [facility] ENDOSCOPY;  Service: Gastroenterology;;   TUBAL LIGATION     WISDOM TOOTH EXTRACTION      Patient Active Problem List   Diagnosis Date Noted   Lung mass 01/20/2024   Sprain of shoulder and upper arm 05/09/2020   Impingement syndrome of shoulder region 05/09/2020   Stomach irritation    Gastric polyp    Special screening for malignant neoplasms, colon    Pain in joint of right shoulder 04/15/2019   Iron deficiency anemia 02/26/2019   Vitamin D deficiency 01/04/2019   Elevated BP without diagnosis of hypertension 12/07/2018   Osteopenia of spine 09/17/2018   Hiatal hernia 08/28/2018   Obesity (BMI 30.0-34.9) 05/30/2018   Gastroesophageal reflux disease without esophagitis 05/30/2018   Pre-diabetes 05/30/2018   Pure hypercholesterolemia 05/30/2018   Hypothyroidism (acquired) 05/29/2018   Suspected sleep apnea 05/29/2018    Family History  Problem Relation Age of Onset   Cancer Mother        Breast, Paget's removed nipple 37, breast 2001   Colon polyps Mother    Paget's disease of bone Mother    Hypertension Mother    Hyperlipidemia Mother    Heart disease Father    Heart attack Father    Heart disease Maternal Grandfather    Stroke Paternal Grandmother    Colon cancer Neg Hx    Esophageal cancer Neg Hx    Inflammatory bowel disease Neg Hx    Liver disease Neg Hx    Pancreatic cancer Neg Hx    Rectal cancer Neg Hx    Stomach cancer Neg Hx     Social History   Tobacco Use   Smoking status: Never   Smokeless tobacco: Never  Substance Use Topics   Alcohol use: Never    Allergies  Allergen Reactions   Aspirin Anaphylaxis   Contrast Media [Iodinated Contrast Media]  Nausea And Vomiting and Palpitations   Elemental Sulfur Hives and Rash   Ibuprofen Anaphylaxis   Latex Rash    Any latex adhesive, rubber compounds:  examples: ekg leads,etc.   Penicillins Anaphylaxis    Did it involve swelling of the face/tongue/throat, SOB, or low BP? Yes Did it involve sudden or severe rash/hives, skin peeling, or any reaction on the inside of your mouth or nose? No Did you need to seek medical attention at a hospital or doctor's office? Yes When did it last happen?      40 years ago/ Patient was in the hospital when happend If all above answers are "NO", may proceed with cephalosporin use.   Rubbing Alcohol [Alcohol] Other (See Comments)    Severe skin warmth   Adhesive [Tape]    Medical Adhesive Remover     Other reaction(s): Not available   Sulfa Antibiotics     Other reaction(s): Not available  Current Meds  Medication Sig   Cholecalciferol (VITAMIN D3) 125 MCG (5000 UT) CAPS Take 1 capsule (5,000 Units total) by mouth daily. For 8 weeks, then start Vitamin D3 2,000 units daily (OTC)   fexofenadine (ALLEGRA) 180 MG tablet Take 180 mg by mouth daily as needed for allergies or rhinitis.   fluticasone (FLONASE) 50 MCG/ACT nasal spray PLACE 2 SPRAYS INTO BOTH NOSTRILS DAILY. USE FOR 4-6 WEEKS THEN STOP AND USE SEASONALLY OR AS NEEDED   ipratropium (ATROVENT) 0.06 % nasal spray Place 2 sprays into both nostrils 4 (four) times daily.   montelukast (SINGULAIR) 10 MG tablet TAKE 1 TABLET BY MOUTH EVERYDAY AT BEDTIME   nystatin cream (MYCOSTATIN) Apply 1 application  topically as needed.   phenazopyridine (PYRIDIUM) 200 MG tablet Take 1 tablet (200 mg total) by mouth 3 (three) times daily.   phentermine 37.5 MG capsule Take 37.5 mg by mouth every morning.   SYNTHROID 125 MCG tablet Take 125 mcg by mouth daily.   [DISCONTINUED] levothyroxine (SYNTHROID) 150 MCG tablet 150 mcg daily before breakfast.    Immunization History  Administered Date(s) Administered    PFIZER(Purple Top)SARS-COV-2 Vaccination 01/08/2020, 01/29/2020, 10/24/2020   PNEUMOCOCCAL CONJUGATE-20 08/17/2021   Tdap 08/28/2018        Objective:    BP 122/76 (BP Location: Right Arm, Cuff Size: Large)   Pulse 71   Temp (!) 96.9 F (36.1 C)   Ht 5\' 8"  (1.727 m)   Wt 216 lb 9.6 oz (98.2 kg)   LMP  (LMP Unknown)   SpO2 99%   BMI 32.93 kg/m   SpO2: 99 % O2 Device: None (Room air)  GENERAL: Well-developed, well-nourished woman, no acute distress. HEAD: Normocephalic, atraumatic.  EYES: Pupils equal, round, reactive to light.  No scleral icterus.  MOUTH: Dentition intact, oral mucosa moist.  No thrush. NECK: Supple.  Prominent right thyroid lobe. Trachea midline. No JVD.  No adenopathy. PULMONARY: Good air entry bilaterally.  No adventitious sounds. CARDIOVASCULAR: S1 and S2. Regular rate and rhythm.  No rubs, murmurs or gallops heard. ABDOMEN: Benign MUSCULOSKELETAL: No joint deformity, no clubbing, no edema.  NEUROLOGIC: No overt focal deficit, no gait disturbance, speech is fluent. SKIN: Intact,warm,dry. PSYCH: Mood and behavior normal.  Chest CT and PET/CT were independently reviewed with representative images as below.  These images were shown to the patient.  Patient had opportunity to ask questions, these were answered to her satisfaction.  Representative image from cardiac CT from 02 January 2024, showing left lower lobe nodule on the lung windows (arrow):   Representative images from CT performed 26 January 2024 showing activity on the nodule in question particularly on non fused image:  Nonfused image   Fused image   Assessment & Plan:     ICD-10-CM   1. Lung nodule seen on imaging study  R91.1 CT SUPER D CHEST WO [facility] PILOT    2. Gastroesophageal reflux disease without esophagitis  K21.9     3. Hypothyroidism (acquired)  E03.9       Orders Placed This Encounter  Procedures   CT SUPER D CHEST WO [facility] PILOT    Standing Status:   Future     Expiration Date:   01/26/2025    Scheduling Instructions:     Please do before 02 February 2024.  Patient has robotic assisted navigational bronchoscopy on 7 April.    Preferred imaging location?:   [facility]   Discussion:    Pulmonary nodule A pulmonary nodule was identified  on a cardiac CT scan, located in the lower lung. PET scan shows some activity, suggesting a benign tumor, infection, or malignancy. The nodule's size is unchanged over three weeks, neoplasia, infection or inflammation remain possibilities. She reports lateral chest soreness, on occasion this seems to be unrelated to this issue. A biopsy is necessary to determine the nodule's nature, involving general anesthesia and robotic assistance. Risks include a 1-3% chance of pneumothorax, potentially requiring a chest tube and hospitalization. - Schedule robotic-assisted navigational bronchoscopy for biopsy on April 7th. - Discuss procedure risks, including 1-3% chance of pneumothorax and potential need for chest tube. - Advise arranging transportation and family support in [place] for the procedure. - Perform preoperative scan to map airway and assess nodule size changes. - Plan follow-up in three to four weeks post-procedure.  Asthma and bronchitis Asthma and bronchitis are managed by Dr. Althea Charon. No acute exacerbations reported. - Continue current management for asthma and bronchitis under Dr. Althea Charon' care.  Thyroid activity Increased activity in the right thyroid on PET scan suggests thyroiditis, Hashimoto's disease, or goiter. She has a suppressed goiter and underwent left thyroidectomy in 1981. - Monitor thyroid activity and consider further evaluation if clinically indicated.    Advised if symptoms do not improve or worsen, to please contact office for sooner follow up or seek emergency care.    I spent 60 minutes of dedicated to the care of this patient on the date of this encounter to include pre-visit review  of records, face-to-face time with the patient discussing conditions above, post visit ordering of testing, clinical documentation with the electronic health record, making appropriate referrals as documented, and communicating necessary findings to members of the patients care team.   C. Danice Goltz, MD Advanced Bronchoscopy [facility]    *This note was dictated using voice recognition software/Dragon.  Despite best efforts to proofread, errors can occur which can change the meaning. Any transcriptional errors that result from this process are unintentional and may not be fully corrected at the time of dictation.

## 2024-01-27 NOTE — Telephone Encounter (Signed)
 Robotic Bronchoscopy with EBUS 02/02/2024 at 11:00am at [facility] Lung Nodule [id number], [id number], [id number], [id number]  Synetta Fail please see bronch info.

## 2024-01-27 NOTE — Patient Instructions (Addendum)
 VISIT SUMMARY:  During your visit, we discussed the lung lesion found on your cardiac CT scan. We reviewed your history of asthma, bronchitis, and thyroid issues. We also talked about the next steps for evaluating the lung lesion and managing your other health conditions.  YOUR PLAN:  -PULMONARY NODULE: A pulmonary nodule is a small growth in the lung that can be benign or malignant. We need to perform a biopsy to determine its nature. This will involve a robotic-assisted navigational bronchoscopy scheduled for April 7th. The procedure carries a small risk of lung collapse, which may require a chest tube and hospitalization. Please arrange transportation and family support for the procedure. We will also do a preoperative scan to map your airway and check for any changes in the nodule's size. A follow-up appointment will be scheduled three to four weeks after the procedure.  -ASTHMA AND BRONCHITIS: Asthma and bronchitis are conditions that affect your airways and lungs, causing difficulty in breathing. Your asthma and bronchitis are currently well-managed by Doctor K, and there are no new issues. Please continue with your current treatment plan under Doctor K's care.  -THYROID ACTIVITY: Increased activity in your right thyroid could indicate inflammation or other thyroid conditions. Given your history of a suppressed goiter and left thyroidectomy, we will continue to monitor your thyroid activity and consider further evaluation if needed.  INSTRUCTIONS:  Please ensure you have transportation and family support arranged for your biopsy on April 7th. We will follow up with you three to four weeks after the procedure to discuss the results and next steps.   We discussed that the procedure would have to be done under general anesthesia. The anesthesia team will discuss his part of the process with you.  Complications from the procedure itself are usually minor. One potential complication would be  collapse of the lung which can occur in 1-3% of the cases. If  this happens we would have to put a small tube to relieve the collapse and you would have to spend the night in the hospital in that event.  Another possibility would be that of bleeding, this is usually taken care of during the procedure.  In the situations you may need to be observed overnight.  For the most part though, should be able to go home the same day.  Other possibilities could include that the procedure would be nondiagnostic meaning that no definitive diagnosis could be attained.  We strive to try to decrease these potential issues.

## 2024-01-27 NOTE — H&P (View-Only) (Signed)
 Subjective:    Patient ID: Amanda Pierce, female    DOB: October 23, 1956, 68 y.o.   MRN: [id number]  Patient Care Team: Smitty Cords, DO as PCP - General (Family Medicine) Rickard Patience, MD as Consulting Physician (Oncology)  Chief Complaint  Patient presents with   Consult    Nodule. No SOB, wheezing or cough.    BACKGROUND: Patient is a 68 year old lifelong never smoker with a history of iron deficiency anemia, who presents for evaluation of a lung nodule/mass noted on the left lower lobe incidentally found during cardiac CT.  She is kindly referred by Dr.Zhou Cathie Hoops.  HPI Discussed the use of AI scribe software for clinical note transcription with the patient, who gave verbal consent to proceed.  History of Present Illness   Amanda Pierce is a 68 year old female who presents for evaluation of a lung lesion found on cardiac CT.  A lung lesion was incidentally discovered during a cardiac CT scan. The lesion is located on the left lower lobe of the lung, it measures 2.7 x 2.7 cm. She has no history of smoking and has not experienced any recent cough or bronchitis symptoms. She has a history of bronchitis and asthma, which is managed by Dr. Althea Charon. A subsequent PET scan showed some activity in the area, though it has not been fully interpreted by a radiologist. The lesion has not changed in size over the past three weeks.  She has not noticed any fevers, chills or sweats.  No cough or sputum production.  No hemoptysis.  No weight loss or anorexia.  She does not endorse any shortness of breath.  No orthopnea or paroxysmal nocturnal dyspnea.  No lower extremity edema.  She experiences soreness on the right side, particularly when lying on her right side or drinking liquids. The pain started under her breast and moved to the current location. She stopped wearing an underwire bra, which alleviated some discomfort.  She has a history of a left thyroidectomy performed in 1981 and has been  undergoing regular thyroid ultrasounds on the right since 2021, having these yearly until 2023. She recalls being told she had a suppressed goiter over twenty years ago.  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 She lives alone in [place] after her husband's passing nearly two years ago, with family residing in [place]. She has not traveled outside the country since 2002 and has not been out west.     Review of Systems A 10 point review of systems was performed and it is as noted above otherwise negative.   Past Medical History:  Diagnosis Date   Allergy    Anemia    Asthma    Hypothyroidism    Thyroid disease    hypothyroid    Past Surgical History:  Procedure Laterality Date   BREAST MASS EXCISION Left 1974   benign   COLONOSCOPY WITH PROPOFOL N/A 10/28/2019   Procedure: COLONOSCOPY WITH PROPOFOL;  Surgeon: Pasty Spillers, MD;  Location: [facility] ENDOSCOPY;  Service: Endoscopy;  Laterality: N/A;   ENDOSCOPIC MUCOSAL RESECTION N/A 11/22/2019   Procedure: ENDOSCOPIC MUCOSAL RESECTION;  Surgeon: Meridee Score Netty Starring., MD;  Location: [facility] ENDOSCOPY;  Service: Gastroenterology;  Laterality: N/A;   ESOPHAGOGASTRODUODENOSCOPY (EGD) WITH PROPOFOL N/A 10/28/2019   Procedure: ESOPHAGOGASTRODUODENOSCOPY (EGD) WITH PROPOFOL;  Surgeon: Pasty Spillers, MD;  Location: [facility] ENDOSCOPY;  Service: Endoscopy;  Laterality: N/A;   ESOPHAGOGASTRODUODENOSCOPY (EGD) WITH PROPOFOL N/A 11/22/2019   Procedure:  ESOPHAGOGASTRODUODENOSCOPY (EGD) WITH PROPOFOL;  Surgeon: Mansouraty, Netty Starring., MD;  Location: [facility] ENDOSCOPY;  Service: Gastroenterology;  Laterality: N/A;   HEMOSTASIS CLIP PLACEMENT  11/22/2019   Procedure: HEMOSTASIS CLIP PLACEMENT;  Surgeon: Lemar Lofty., MD;  Location: [facility] ENDOSCOPY;  Service: Gastroenterology;;   HEMOSTASIS CONTROL  11/22/2019   Procedure: HEMOSTASIS CONTROL;  Surgeon: Lemar Lofty.,  MD;  Location: [facility] ENDOSCOPY;  Service: Gastroenterology;;  epi    nodule removed  1982   from thyroid -benign   PARATHYROIDECTOMY  1982   patient denies this dx   SUBMUCOSAL LIFTING INJECTION  11/22/2019   Procedure: SUBMUCOSAL LIFTING INJECTION;  Surgeon: Lemar Lofty., MD;  Location[facility] ENDOSCOPY;  Service: Gastroenterology;;   TUBAL LIGATION     WISDOM TOOTH EXTRACTION      Patient Active Problem List   Diagnosis Date Noted   Lung mass 01/20/2024   Sprain of shoulder and upper arm 05/09/2020   Impingement syndrome of shoulder region 05/09/2020   Stomach irritation    Gastric polyp    Special screening for malignant neoplasms, colon    Pain in joint of right shoulder 04/15/2019   Iron deficiency anemia 02/26/2019   Vitamin D deficiency 01/04/2019   Elevated BP without diagnosis of hypertension 12/07/2018   Osteopenia of spine 09/17/2018   Hiatal hernia 08/28/2018   Obesity (BMI 30.0-34.9) 05/30/2018   Gastroesophageal reflux disease without esophagitis 05/30/2018   Pre-diabetes 05/30/2018   Pure hypercholesterolemia 05/30/2018   Hypothyroidism (acquired) 05/29/2018   Suspected sleep apnea 05/29/2018    Family History  Problem Relation Age of Onset   Cancer Mother        Breast, Paget's removed nipple 37, breast 2001   Colon polyps Mother    Paget's disease of bone Mother    Hypertension Mother    Hyperlipidemia Mother    Heart disease Father    Heart attack Father    Heart disease Maternal Grandfather    Stroke Paternal Grandmother    Colon cancer Neg Hx    Esophageal cancer Neg Hx    Inflammatory bowel disease Neg Hx    Liver disease Neg Hx    Pancreatic cancer Neg Hx    Rectal cancer Neg Hx    Stomach cancer Neg Hx     Social History   Tobacco Use   Smoking status: Never   Smokeless tobacco: Never  Substance Use Topics   Alcohol use: Never    Allergies  Allergen Reactions   Aspirin Anaphylaxis   Contrast Media [Iodinated Contrast Media]  Nausea And Vomiting and Palpitations   Elemental Sulfur Hives and Rash   Ibuprofen Anaphylaxis   Latex Rash    Any latex adhesive, rubber compounds:  examples: ekg leads,etc.   Penicillins Anaphylaxis    Did it involve swelling of the face/tongue/throat, SOB, or low BP? Yes Did it involve sudden or severe rash/hives, skin peeling, or any reaction on the inside of your mouth or nose? No Did you need to seek medical attention at a hospital or doctor's office? Yes When did it last happen?      40 years ago/ Patient was in the hospital when happend If all above answers are "NO", may proceed with cephalosporin use.   Rubbing Alcohol [Alcohol] Other (See Comments)    Severe skin warmth   Adhesive [Tape]    Medical Adhesive Remover     Other reaction(s): Not available   Sulfa Antibiotics     Other reaction(s): Not available  Current Meds  Medication Sig   Cholecalciferol (VITAMIN D3) 125 MCG (5000 UT) CAPS Take 1 capsule (5,000 Units total) by mouth daily. For 8 weeks, then start Vitamin D3 2,000 units daily (OTC)   fexofenadine (ALLEGRA) 180 MG tablet Take 180 mg by mouth daily as needed for allergies or rhinitis.   fluticasone (FLONASE) 50 MCG/ACT nasal spray PLACE 2 SPRAYS INTO BOTH NOSTRILS DAILY. USE FOR 4-6 WEEKS THEN STOP AND USE SEASONALLY OR AS NEEDED   ipratropium (ATROVENT) 0.06 % nasal spray Place 2 sprays into both nostrils 4 (four) times daily.   montelukast (SINGULAIR) 10 MG tablet TAKE 1 TABLET BY MOUTH EVERYDAY AT BEDTIME   nystatin cream (MYCOSTATIN) Apply 1 application  topically as needed.   phenazopyridine (PYRIDIUM) 200 MG tablet Take 1 tablet (200 mg total) by mouth 3 (three) times daily.   phentermine 37.5 MG capsule Take 37.5 mg by mouth every morning.   SYNTHROID 125 MCG tablet Take 125 mcg by mouth daily.   [DISCONTINUED] levothyroxine (SYNTHROID) 150 MCG tablet 150 mcg daily before breakfast.    Immunization History  Administered Date(s) Administered    PFIZER(Purple Top)SARS-COV-2 Vaccination 01/08/2020, 01/29/2020, 10/24/2020   PNEUMOCOCCAL CONJUGATE-20 08/17/2021   Tdap 08/28/2018        Objective:    BP 122/76 (BP Location: Right Arm, Cuff Size: Large)   Pulse 71   Temp (!) 96.9 F (36.1 C)   Ht 5\' 8"  (1.727 m)   Wt 216 lb 9.6 oz (98.2 kg)   LMP  (LMP Unknown)   SpO2 99%   BMI 32.93 kg/m   SpO2: 99 % O2 Device: None (Room air)  GENERAL: Well-developed, well-nourished woman, no acute distress. HEAD: Normocephalic, atraumatic.  EYES: Pupils equal, round, reactive to light.  No scleral icterus.  MOUTH: Dentition intact, oral mucosa moist.  No thrush. NECK: Supple.  Prominent right thyroid lobe. Trachea midline. No JVD.  No adenopathy. PULMONARY: Good air entry bilaterally.  No adventitious sounds. CARDIOVASCULAR: S1 and S2. Regular rate and rhythm.  No rubs, murmurs or gallops heard. ABDOMEN: Benign MUSCULOSKELETAL: No joint deformity, no clubbing, no edema.  NEUROLOGIC: No overt focal deficit, no gait disturbance, speech is fluent. SKIN: Intact,warm,dry. PSYCH: Mood and behavior normal.  Chest CT and PET/CT were independently reviewed with representative images as below.  These images were shown to the patient.  Patient had opportunity to ask questions, these were answered to her satisfaction.  Representative image from cardiac CT from 02 January 2024, showing left lower lobe nodule on the lung windows (arrow):   Representative images from CT performed 26 January 2024 showing activity on the nodule in question particularly on non fused image:  Nonfused image   Fused image   Assessment & Plan:     ICD-10-CM   1. Lung nodule seen on imaging study  R91.1 CT SUPER D CHEST WO [facility] PILOT    2. Gastroesophageal reflux disease without esophagitis  K21.9     3. Hypothyroidism (acquired)  E03.9       Orders Placed This Encounter  Procedures   CT SUPER D CHEST WO [facility] PILOT    Standing Status:   Future     Expiration Date:   01/26/2025    Scheduling Instructions:     Please do before 02 February 2024.  Patient has robotic assisted navigational bronchoscopy on 7 April.    Preferred imaging location?:   [facility]   Discussion:    Pulmonary nodule A pulmonary nodule was identified  on a cardiac CT scan, located in the lower lung. PET scan shows some activity, suggesting a benign tumor, infection, or malignancy. The nodule's size is unchanged over three weeks, neoplasia, infection or inflammation remain possibilities. She reports lateral chest soreness, on occasion this seems to be unrelated to this issue. A biopsy is necessary to determine the nodule's nature, involving general anesthesia and robotic assistance. Risks include a 1-3% chance of pneumothorax, potentially requiring a chest tube and hospitalization. - Schedule robotic-assisted navigational bronchoscopy for biopsy on April 7th. - Discuss procedure risks, including 1-3% chance of pneumothorax and potential need for chest tube. - Advise arranging transportation and family support in [place] for the procedure. - Perform preoperative scan to map airway and assess nodule size changes. - Plan follow-up in three to four weeks post-procedure.  Asthma and bronchitis Asthma and bronchitis are managed by Dr. Althea Charon. No acute exacerbations reported. - Continue current management for asthma and bronchitis under Dr. Althea Charon' care.  Thyroid activity Increased activity in the right thyroid on PET scan suggests thyroiditis, Hashimoto's disease, or goiter. She has a suppressed goiter and underwent left thyroidectomy in 1981. - Monitor thyroid activity and consider further evaluation if clinically indicated.    Advised if symptoms do not improve or worsen, to please contact office for sooner follow up or seek emergency care.    I spent 60 minutes of dedicated to the care of this patient on the date of this encounter to include pre-visit review  of records, face-to-face time with the patient discussing conditions above, post visit ordering of testing, clinical documentation with the electronic health record, making appropriate referrals as documented, and communicating necessary findings to members of the patients care team.   C. Danice Goltz, MD Advanced Bronchoscopy PCCM [facility]    *This note was dictated using voice recognition software/Dragon.  Despite best efforts to proofread, errors can occur which can change the meaning. Any transcriptional errors that result from this process are unintentional and may not be fully corrected at the time of dictation.

## 2024-01-28 ENCOUNTER — Encounter
Admission: RE | Admit: 2024-01-28 | Discharge: 2024-01-28 | Disposition: A | Discharge status: Home / Self Care | Source: Ambulatory Visit | Attending: Pulmonary Disease | Admitting: Pulmonary Disease

## 2024-01-28 ENCOUNTER — Other Ambulatory Visit: Payer: Self-pay

## 2024-01-28 VITALS — Ht 68.0 in | Wt 215.0 lb

## 2024-01-28 DIAGNOSIS — Z01810 Encounter for preprocedural cardiovascular examination: Secondary | ICD-10-CM

## 2024-01-28 DIAGNOSIS — R030 Elevated blood-pressure reading, without diagnosis of hypertension: Secondary | ICD-10-CM

## 2024-01-28 DIAGNOSIS — Z01812 Encounter for preprocedural laboratory examination: Secondary | ICD-10-CM

## 2024-01-28 HISTORY — DX: Personal history of other diseases of the digestive system: Z87.19

## 2024-01-28 HISTORY — DX: Gastro-esophageal reflux disease without esophagitis: K21.9

## 2024-01-28 HISTORY — DX: Other specified disorders of bone density and structure, unspecified site: M85.80

## 2024-01-28 HISTORY — DX: Prediabetes: R73.03

## 2024-01-28 HISTORY — DX: Pure hypercholesterolemia, unspecified: E78.00

## 2024-01-28 HISTORY — DX: Iron deficiency anemia, unspecified: D50.9

## 2024-01-28 HISTORY — DX: Other symptoms and signs involving the nervous system: R29.818

## 2024-01-28 NOTE — Telephone Encounter (Signed)
 For the codes 60454, [id number], [id number], S2900 Prior Auth Not Required Refer # [id number]

## 2024-01-28 NOTE — Patient Instructions (Addendum)
 Your procedure is scheduled on: Monday, April 7 Report to the Registration Desk on the 1st floor of the [facility] To find out your arrival time, please call [phone] between 1PM - 3PM on: Friday, April 4 If your arrival time is 6:00 am, do not arrive before that time as the [facility] entrance doors do not open until 6:00 am.  REMEMBER: Instructions that are not followed completely may result in serious medical risk, up to and including death; or upon the discretion of your surgeon and anesthesiologist your surgery may need to be rescheduled.  Do not eat food after midnight the night before surgery.  No gum chewing or hard candies.  You may however, drink CLEAR liquids up to 2 hours before you are scheduled to arrive for your surgery. Do not drink anything within 2 hours of your scheduled arrival time.  Clear liquids include: - water  - apple juice without pulp - gatorade (not RED colors) - black coffee or tea (Do NOT add milk or creamers to the coffee or tea) Do NOT drink anything that is not on this list.  One week prior to surgery:  Stop Anti-inflammatories (NSAIDS) such as Advil, Aleve, Ibuprofen, Motrin, Naproxen, Naprosyn and Aspirin based products such as Excedrin, Goody's Powder, BC Powder. Stop ANY OVER THE COUNTER supplements until after surgery.  You may however, continue to take Tylenol if needed for pain up until the day of surgery.  Continue taking all of your other prescription medications up until the day of surgery.  ON THE DAY OF SURGERY ONLY TAKE THESE MEDICATIONS WITH SIPS OF WATER:  Synthroid  No Alcohol for 24 hours before or after surgery.  No Smoking including e-cigarettes for 24 hours before surgery.  No chewable tobacco products for at least 6 hours before surgery.  No nicotine patches on the day of surgery.  Do not use any "recreational" drugs for at least a week (preferably 2 weeks) before your surgery.  Please be advised that the  combination of cocaine and anesthesia may have negative outcomes, up to and including death. If you test positive for cocaine, your surgery will be cancelled.  On the morning of surgery brush your teeth with toothpaste and water, you may rinse your mouth with mouthwash if you wish. Do not swallow any toothpaste or mouthwash.  Do not wear jewelry, make-up, hairpins, clips or nail polish.  For welded (permanent) jewelry: bracelets, anklets, waist bands, etc.  Please have this removed prior to surgery.  If it is not removed, there is a chance that hospital personnel will need to cut it off on the day of surgery.  Do not wear lotions, powders, or perfumes.   Do not shave body hair from the neck down 48 hours before surgery.  Contact lenses, hearing aids and dentures may not be worn into surgery.  Do not bring valuables to the hospital. [facility] is not responsible for any missing/lost belongings or valuables.   Notify your doctor if there is any change in your medical condition (cold, fever, infection).  Wear comfortable clothing (specific to your surgery type) to the hospital.  After surgery, you can help prevent lung complications by doing breathing exercises.  Take deep breaths and cough every 1-2 hours.   If you are being discharged the day of surgery, you will not be allowed to drive home. You will need a responsible individual to drive you home and stay with you for 24 hours after surgery.   If  you are taking public transportation, you will need to have a responsible individual with you.  Please call the Pre-admissions Testing Dept. at [phone] if you have any questions about these instructions.  Surgery Visitation Policy:  Patients having surgery or a procedure may have two visitors.  Children under the age of 49 must have an adult with them who is not the patient.

## 2024-01-28 NOTE — Telephone Encounter (Signed)
 Noted. Nothing further needed.

## 2024-01-29 ENCOUNTER — Encounter: Payer: Self-pay | Admitting: Urgent Care

## 2024-01-30 ENCOUNTER — Encounter
Admission: RE | Admit: 2024-01-30 | Discharge: 2024-01-30 | Disposition: A | Discharge status: Home / Self Care | Source: Ambulatory Visit | Attending: Pulmonary Disease | Admitting: Pulmonary Disease

## 2024-01-30 ENCOUNTER — Ambulatory Visit
Admission: RE | Admit: 2024-01-30 | Discharge: 2024-01-30 | Disposition: A | Discharge status: Home / Self Care | Source: Ambulatory Visit | Attending: Pulmonary Disease | Admitting: Pulmonary Disease

## 2024-01-30 DIAGNOSIS — R911 Solitary pulmonary nodule: Secondary | ICD-10-CM | POA: Diagnosis present

## 2024-01-30 DIAGNOSIS — Z01810 Encounter for preprocedural cardiovascular examination: Secondary | ICD-10-CM

## 2024-01-30 DIAGNOSIS — Z01812 Encounter for preprocedural laboratory examination: Secondary | ICD-10-CM

## 2024-01-30 DIAGNOSIS — R030 Elevated blood-pressure reading, without diagnosis of hypertension: Secondary | ICD-10-CM | POA: Diagnosis present

## 2024-02-02 ENCOUNTER — Encounter
Admission: RE | Disposition: A | Discharge status: Home / Self Care | Payer: Self-pay | Source: Home / Self Care | Attending: Pulmonary Disease

## 2024-02-02 ENCOUNTER — Ambulatory Visit: Admitting: Anesthesiology

## 2024-02-02 ENCOUNTER — Ambulatory Visit

## 2024-02-02 ENCOUNTER — Other Ambulatory Visit: Payer: Self-pay

## 2024-02-02 ENCOUNTER — Ambulatory Visit
Admission: RE | Admit: 2024-02-02 | Discharge: 2024-02-02 | Disposition: A | Discharge status: Home / Self Care | Attending: Pulmonary Disease | Admitting: Pulmonary Disease

## 2024-02-02 ENCOUNTER — Encounter: Payer: Self-pay | Admitting: Pulmonary Disease

## 2024-02-02 DIAGNOSIS — C3492 Malignant neoplasm of unspecified part of left bronchus or lung: Secondary | ICD-10-CM | POA: Diagnosis present

## 2024-02-02 DIAGNOSIS — R911 Solitary pulmonary nodule: Secondary | ICD-10-CM

## 2024-02-02 DIAGNOSIS — J45909 Unspecified asthma, uncomplicated: Secondary | ICD-10-CM | POA: Diagnosis not present

## 2024-02-02 DIAGNOSIS — E039 Hypothyroidism, unspecified: Secondary | ICD-10-CM | POA: Diagnosis not present

## 2024-02-02 DIAGNOSIS — R918 Other nonspecific abnormal finding of lung field: Secondary | ICD-10-CM | POA: Insufficient documentation

## 2024-02-02 DIAGNOSIS — Z79890 Hormone replacement therapy (postmenopausal): Secondary | ICD-10-CM | POA: Diagnosis not present

## 2024-02-02 HISTORY — PX: BRONCHOSCOPY, WITH BIOPSY USING ELECTROMAGNETIC NAVIGATION: SHX7536

## 2024-02-02 HISTORY — PX: VIDEO BRONCHOSCOPY WITH ENDOBRONCHIAL ULTRASOUND: SHX6177

## 2024-02-02 LAB — GLUCOSE, CAPILLARY: Glucose-Capillary: 89 mg/dL (ref 70–99)

## 2024-02-02 SURGERY — BRONCHOSCOPY, WITH BIOPSY USING ELECTROMAGNETIC NAVIGATION
Anesthesia: General | Laterality: Left

## 2024-02-02 MED ORDER — FENTANYL CITRATE (PF) 100 MCG/2ML IJ SOLN
INTRAMUSCULAR | Status: AC
  Filled 2024-02-02: qty 2

## 2024-02-02 MED ORDER — ROCURONIUM BROMIDE 100 MG/10ML IV SOLN
INTRAVENOUS | Status: DC | PRN
  Administered 2024-02-02: 60 mg via INTRAVENOUS

## 2024-02-02 MED ORDER — SUGAMMADEX SODIUM 200 MG/2ML IV SOLN
INTRAVENOUS | Status: DC | PRN
  Administered 2024-02-02: 200 mg via INTRAVENOUS

## 2024-02-02 MED ORDER — ORAL CARE MOUTH RINSE: 15.0000 mL | Freq: Once | OROMUCOSAL | Status: DC

## 2024-02-02 MED ORDER — EPHEDRINE SULFATE-NACL 50-0.9 MG/10ML-% IV SOSY
PREFILLED_SYRINGE | INTRAVENOUS | Status: DC | PRN
Start: 2024-02-02 — End: 2024-02-02
  Administered 2024-02-02: 5 mg via INTRAVENOUS

## 2024-02-02 MED ORDER — CHLORHEXIDINE GLUCONATE 0.12 % MT SOLN
OROMUCOSAL | Status: AC
  Filled 2024-02-02: qty 15

## 2024-02-02 MED ORDER — LACTATED RINGERS IV SOLN: INTRAVENOUS | Status: DC

## 2024-02-02 MED ORDER — PROPOFOL 10 MG/ML IV BOLUS
INTRAVENOUS | Status: AC
  Filled 2024-02-02: qty 20

## 2024-02-02 MED ORDER — LIDOCAINE HCL (CARDIAC) PF 100 MG/5ML IV SOSY
PREFILLED_SYRINGE | INTRAVENOUS | Status: DC | PRN
  Administered 2024-02-02: 60 mg via INTRAVENOUS

## 2024-02-02 MED ORDER — IPRATROPIUM-ALBUTEROL 0.5-2.5 (3) MG/3ML IN SOLN
3.0000 mL | Freq: Once | RESPIRATORY_TRACT | Status: AC
  Administered 2024-02-02: 3 mL via RESPIRATORY_TRACT

## 2024-02-02 MED ORDER — FENTANYL CITRATE (PF) 100 MCG/2ML IJ SOLN
INTRAMUSCULAR | Status: DC | PRN
Start: 2024-02-02 — End: 2024-02-02
  Administered 2024-02-02: 50 ug via INTRAVENOUS
  Administered 2024-02-02 (×2): 25 ug via INTRAVENOUS

## 2024-02-02 MED ORDER — LIDOCAINE HCL (PF) 2 % IJ SOLN
INTRAMUSCULAR | Status: AC
  Filled 2024-02-02: qty 5

## 2024-02-02 MED ORDER — ONDANSETRON HCL 4 MG/2ML IJ SOLN
INTRAMUSCULAR | Status: DC | PRN
  Administered 2024-02-02: 4 mg via INTRAVENOUS

## 2024-02-02 MED ORDER — PROPOFOL 10 MG/ML IV BOLUS
INTRAVENOUS | Status: DC | PRN
Start: 2024-02-02 — End: 2024-02-02
  Administered 2024-02-02: 150 mg via INTRAVENOUS

## 2024-02-02 MED ORDER — CHLORHEXIDINE GLUCONATE 0.12 % MT SOLN: 15.0000 mL | Freq: Once | OROMUCOSAL | Status: DC

## 2024-02-02 MED ORDER — DEXAMETHASONE SODIUM PHOSPHATE 10 MG/ML IJ SOLN
INTRAMUSCULAR | Status: DC | PRN
  Administered 2024-02-02: 10 mg via INTRAVENOUS

## 2024-02-02 MED ORDER — IPRATROPIUM-ALBUTEROL 0.5-2.5 (3) MG/3ML IN SOLN
RESPIRATORY_TRACT | Status: AC
  Filled 2024-02-02: qty 3

## 2024-02-02 NOTE — Anesthesia Procedure Notes (Signed)
 Procedure Name: Intubation Date/Time: 02/02/2024 11:56 AM  Performed by: Clydia Llano, CRNAPre-anesthesia Checklist: Patient identified, Patient being monitored, Timeout performed, Emergency Drugs available and Suction available Patient Re-evaluated:Patient Re-evaluated prior to induction Oxygen Delivery Method: Circle system utilized Preoxygenation: Pre-oxygenation with 100% oxygen Induction Type: IV induction Ventilation: Mask ventilation without difficulty Laryngoscope Size: 3 and McGrath Grade View: Grade I Tube type: Oral Tube size: 8.5 mm Number of attempts: 1 Airway Equipment and Method: Stylet Placement Confirmation: ETT inserted through vocal cords under direct vision, positive ETCO2 and breath sounds checked- equal and bilateral Secured at: 21 cm Tube secured with: Tape Dental Injury: Teeth and Oropharynx as per pre-operative assessment

## 2024-02-02 NOTE — Interval H&P Note (Signed)
 Amanda Pierce has presented today for surgery, with the diagnosis of LEFT LUNG NODULE/MASS.  The various methods of treatment have been discussed with the patient and family. After consideration of risks, benefits and other options for treatment, the patient has consented to  Procedure(s): ROBOTIC ASSISTED NAVIGATIONAL BRONCHOSCOPY, EBUS FOR MEDIASTINAL STAGING-LEFT as a surgical intervention.  The patient's history has been reviewed, patient examined, no change in status, stable for surgery.  I have reviewed the patient's chart and labs.  Questions were answered to the patient's satisfaction.  Benefits, limitations and potential complications of the procedure were discussed with the patient/family.  Complications from bronchoscopy are rare and most often minor, but if they occur they may include breathing difficulty, vocal cord spasm, hoarseness, slight fever, vomiting, dizziness, bronchospasm, infection, low blood oxygen, bleeding from biopsy site, or an allergic reaction to medications.  It is uncommon for patients to experience other more serious complications for example: Collapsed lung requiring chest tube placement, respiratory failure, heart attack and/or cardiac arrhythmia.  Patient agrees to proceed.  Gailen Shelter, MD Advanced Bronchoscopy PCCM [facility]    *This note was generated using voice recognition software/Dragon and/or AI transcription program.  Despite best efforts to proofread, errors can occur which can change the meaning. Any transcriptional errors that result from this process are unintentional and may not be fully corrected at the time of dictation.

## 2024-02-02 NOTE — Transfer of Care (Signed)
 Immediate Anesthesia Transfer of Care Note  Patient: Amanda Pierce  Procedure(s) Performed: BRONCHOSCOPY, WITH BIOPSY USING ELECTROMAGNETIC NAVIGATION (Left) BRONCHOSCOPY, WITH EBUS (Left)  Patient Location: PACU  Anesthesia Type:General  Level of Consciousness: awake, alert , and oriented  Airway & Oxygen Therapy: Patient Spontanous Breathing  Post-op Assessment: Report given to RN and Post -op Vital signs reviewed and stable  Post vital signs: Reviewed and stable  Last Vitals:  Vitals Value Taken Time  BP 130/79 02/02/24 1315  Temp    Pulse 75 02/02/24 1319  Resp 15 02/02/24 1319  SpO2 100 % 02/02/24 1319  Vitals shown include unfiled device data.  Last Pain:  Vitals:   02/02/24 0945  TempSrc: Temporal  PainSc: 2          Complications: No notable events documented.

## 2024-02-02 NOTE — Op Note (Signed)
 PROCEDURES Robotic assisted bronchoscopy Cellvizio probe based confocal laser endomicroscopy (pCLE) Augmented fluoroscopy   Indication: Patient is a 68 year old lifelong never smoker with a left lower lobe nodule/mass 2.7 cm diameter, rule out cancer  Preoperative Diagnosis: Left lower lobe nodule/mass rule out cancer Post Procedure Diagnosis: As above Consent: Verbal/Written: obtained  Benefits, limitations and potential complications of the procedure were discussed with the patient/family.  Complications from bronchoscopy are rare and most often minor, but if they occur they may include breathing difficulty, vocal cord spasm, hoarseness, slight fever, vomiting, dizziness, bronchospasm, infection, low blood oxygen, bleeding from biopsy site, or an allergic reaction to medications.  It is uncommon for patients to experience other more serious complications for example: Collapsed lung requiring chest tube placement, respiratory failure, heart attack and/or cardiac arrhythmia.  Patient understood the potential complications and agreed to proceed.  Surgeon: Gailen Shelter, MD Assistant/Scrub: Denyse Amass, RRT Circulator: Leonie Man, RRT Anesthesiologist/CRNA:  Cytotechnology: Present Fluoroscopy technician: Fredric Dine, RT Representatives: Jolaine Artist, [employer] ([employer])  Type of Anesthesia: General endotracheal  Procedures Performed:   Robotic bronchoscopy: Procedure consists of robotic navigation comprised of electromagnetics, optical pattern recognition and robotic kinematic data - to triangulate bronchoscope location during the procedure and provide accurate positional data to biopsy a lesion. Cellvizio probe based confocal laser endomicroscopy (pCLE) utilizing blue laser endomicroscopy. Augmented fluoroscopy with Body Vision.  Description of Procedure:  Robotic bronchoscopy: The patient was brought to Procedure Room 2 (Bronchoscopy Suite) in the OR area where  appropriate timeout was taken with the staff after the patient was inducted under general anesthesia.  The patient was inducted under general anesthesia and intubated by the anesthesia team.  Patient was intubated with a 8.5 ET tube without difficulty.  Tube was secured at 4 cm above the carina.  A Portex adapter was placed on the ET tube flange.  Once the patient was under adequate general anesthesia the Olympus therapeutic video bronchoscope was advanced and an anatomic airway tour and surveillance bronchoscopy was performed.The distal trachea appeared unremarkable. The main carina was sharp.  No secretions were seen in either right or left mainstem bronchi. The RUL, RLL, RML appeared to be free of endobronchial masses, lesions, or purulent secretions. Likewise, the LLL/LUL appeared to be free of endobronchial masses, lesions, or purulent secretions. Once the survey bronchoscopy was completed, registration for the augmented fluoroscopy (Body Vision) was then performed with the fluoroscopic C arm.  Once this was completed, the robotic bronchoscope ET tube adapter was placed and ETT was cut to proper length and secured on the mid plane.  The [facility] robotic scope was then advanced through the ETT and registration was performed successfully.  There was good correlation between the robotic mapping and bronchoscopic mapping. With the assistance of fused navigation, the bronchoscope was advanced to the LLL nodule/mass. The tip of the working channel sat within 8 mm of the nodule  Positioning was confirmed with augmented fluoroscopy.  At this point Cellvizio probe based confocal laser endomicroscopy (pCLE) was utilized to confirm target acquisition.  The images through Cellvizio were consistent with an amorphous, ill characterized mass.   Augmented fluoroscopy via Body Vision was utilized to optimize the position most favorable for biopsies, then the robotic bronchoscope was anchored to maintain position.  X 6  transbronchial biopsies were obtained at this point, the lesion could be seen moving during biopsies.  The lesion was hard to penetrate and only scant material could be obtained.  After confirmation of excellent hemostasis, 3  passes with a cytology brush on the LLL nodule/mass were performed, the brushes were cut into CytoLyt preservative.  First brushing was subjected to ROSE but this was nondiagnostic.  Following this 3 passes were performed with an Olympus Peri View FLEX NEEDLE and all material was placed into cytolyt.  The material obtained was also scant as the lesion was hard to penetrate.  A BAL was also obtained at this segment, which sent for cultures.  For BAL sample acquisition purposes, 20 ml of normal saline were instilled, and approximately 6 ml were recovered/trapped and sent for analysis.   The robotic bronchoscope was then retracted all the way out after confirmation of excellent hemostasis. The patient tolerated the procedure well. No significant bleeding was observed at the conclusion of the procedure.  At this point, the patient was allowed to emerge from general anesthesia, and was extubated in the procedure room without incident.  The patient  was taken to the PACU in satisfactory condition.  Auscultation of the lungs showed no change from pre bronchoscopy examination.  Patient tolerated the procedure very well with no untoward effects of anesthesia noted.   Specimens Obtained:  Transbronchial Forceps Biopsy: X 6, LLL  Transbronchial Brush: X 3, LLL  Targeted BAL: 6 mL, LLL  Fluoroscopy: Augmented fluoroscopy (Body Vision) was utilized during the course of this procedure to assure that biopsies were taken in a safe manner under fluoroscopic guidance with spot films required.  Total fluoroscopy time: 6 minutes 8 seconds, total dose 98.36 mGy  Intraoperative images: Fluoroscopic image of robotic bronchoscope at target on the left lower lobe:   Robotic screen review of scope  reaching target:   Scope at target:   Cellvizio probe during inspection:   Cellvizio image of the lesion showing dense amorphous, ill characterized mass:   Complications:None, no pneumothorax on post film:   Estimated Blood Loss: Nil    Assessment and Plan/Additional Comments: Left lower lobe nodule/mass, rule out carcinoma Nodule/mass appears difficult to biopsy due to surface characteristics Status post robotic assisted navigational bronchoscopy Await pathology reports     C. Danice Goltz, MD Advanced Bronchoscopy PCCM  [facility]    *This note was dictated using voice recognition software/Dragon.  Despite best efforts to proofread, errors can occur which can change the meaning.  Any change was purely unintentional.

## 2024-02-02 NOTE — Anesthesia Preprocedure Evaluation (Signed)
 Anesthesia Evaluation  Patient identified by MRN, date of birth, ID band Patient awake    Reviewed: Allergy & Precautions, H&P , NPO status , Patient's Chart, lab work & pertinent test results, reviewed documented beta blocker date and time   History of Anesthesia Complications Negative for: history of anesthetic complications  Airway Mallampati: III  TM Distance: >3 FB Neck ROM: full    Dental  (+) Dental Advidsory Given, Caps, Missing   Pulmonary neg shortness of breath, asthma , neg COPD, neg recent URI   Pulmonary exam normal breath sounds clear to auscultation       Cardiovascular Exercise Tolerance: Good negative cardio ROS Normal cardiovascular exam Rhythm:regular Rate:Normal     Neuro/Psych negative neurological ROS  negative psych ROS   GI/Hepatic Neg liver ROS, hiatal hernia,GERD  ,,  Endo/Other  diabetes (borderline)Hypothyroidism    Renal/GU negative Renal ROS  negative genitourinary   Musculoskeletal   Abdominal   Peds  Hematology  (+) Blood dyscrasia, anemia   Anesthesia Other Findings Past Medical History: No date: Allergy No date: Anemia No date: Asthma No date: GERD (gastroesophageal reflux disease) No date: History of hiatal hernia No date: Hypothyroidism No date: Iron deficiency anemia 12/2023: Left lower lobe pulmonary nodule No date: Osteopenia No date: Pre-diabetes No date: Pure hypercholesterolemia No date: Suspected sleep apnea No date: Thyroid disease     Comment:  hypothyroid   Reproductive/Obstetrics negative OB ROS                             Anesthesia Physical Anesthesia Plan  ASA: 2  Anesthesia Plan: General   Post-op Pain Management:    Induction: Intravenous  PONV Risk Score and Plan: 3 and Ondansetron, Dexamethasone, Treatment may vary due to age or medical condition and Midazolam  Airway Management Planned: Oral ETT  Additional  Equipment:   Intra-op Plan:   Post-operative Plan: Extubation in OR  Informed Consent: I have reviewed the patients History and Physical, chart, labs and discussed the procedure including the risks, benefits and alternatives for the proposed anesthesia with the patient or authorized representative who has indicated his/her understanding and acceptance.     Dental Advisory Given  Plan Discussed with: Anesthesiologist, CRNA and Surgeon  Anesthesia Plan Comments:         Anesthesia Quick Evaluation

## 2024-02-03 ENCOUNTER — Encounter: Payer: Self-pay | Admitting: Pulmonary Disease

## 2024-02-04 ENCOUNTER — Other Ambulatory Visit: Payer: Self-pay | Admitting: Pulmonary Disease

## 2024-02-04 ENCOUNTER — Encounter: Payer: Self-pay | Admitting: Family Medicine

## 2024-02-04 DIAGNOSIS — Z01811 Encounter for preprocedural respiratory examination: Secondary | ICD-10-CM

## 2024-02-04 DIAGNOSIS — C3492 Malignant neoplasm of unspecified part of left bronchus or lung: Secondary | ICD-10-CM

## 2024-02-04 LAB — SURGICAL PATHOLOGY

## 2024-02-04 LAB — CYTOLOGY - NON PAP

## 2024-02-04 NOTE — Progress Notes (Signed)
 Fine-needle aspirate of the 2.7 x 2.7 cm left lower lobe lesion on the left lower lobe obtained on 02 February 2024 via robotic assisted navigational bronchoscopy is positive for NON-SMALL CELL CARCINOMA.  Patient is aware of results.  Will obtain PFTs for preoperative respiratory assessment and refer patient to cardiothoracic surgery for evaluation for potential left lower lobectomy.  Dr. Cathie Hoops, patient's primary oncologist, is aware.     ICD-10-CM   1. Non-small cell cancer of left lung [facility])  C34.92 Ambulatory referral to Cardiothoracic Surgery    Pulmonary function test   Refer to cardiothoracic surgery for potential left lower lobectomy    2. Pre-operative respiratory examination  Z01.811 Pulmonary function test   PFTs for preoperative respiratory assessment       C. Danice Goltz, MD Advanced Bronchoscopy PCCM [place] [facility]

## 2024-02-05 LAB — CULTURE, RESPIRATORY W GRAM STAIN
Gram Stain: NONE SEEN
Special Requests: NORMAL

## 2024-02-06 NOTE — Anesthesia Postprocedure Evaluation (Signed)
 Anesthesia Post Note  Patient: Amanda Pierce  Procedure(s) Performed: BRONCHOSCOPY, WITH BIOPSY USING ELECTROMAGNETIC NAVIGATION (Left) BRONCHOSCOPY, WITH EBUS (Left)  Patient location during evaluation: PACU Anesthesia Type: General Level of consciousness: awake and alert Pain management: pain level controlled Vital Signs Assessment: post-procedure vital signs reviewed and stable Respiratory status: spontaneous breathing, nonlabored ventilation, respiratory function stable and patient connected to nasal cannula oxygen Cardiovascular status: blood pressure returned to baseline and stable Postop Assessment: no apparent nausea or vomiting Anesthetic complications: no   No notable events documented.   Last Vitals:  Vitals:   02/02/24 1400 02/02/24 1430  BP: (!) 151/91 (!) 160/93  Pulse: 62 70  Resp: 11 16  Temp: (P) 36.4 C (!) 36.3 C  SpO2: 100% 99%    Last Pain:  Vitals:   02/03/24 0828  TempSrc:   PainSc: 0-No pain                 Lenard Simmer

## 2024-02-09 ENCOUNTER — Emergency Department
Admission: EM | Admit: 2024-02-09 | Discharge: 2024-02-09 | Disposition: A | Discharge status: Home / Self Care | Attending: Emergency Medicine | Admitting: Emergency Medicine

## 2024-02-09 ENCOUNTER — Other Ambulatory Visit: Payer: Self-pay

## 2024-02-09 ENCOUNTER — Ambulatory Visit: Payer: Self-pay | Admitting: Pulmonary Disease

## 2024-02-09 ENCOUNTER — Emergency Department

## 2024-02-09 DIAGNOSIS — R0789 Other chest pain: Secondary | ICD-10-CM | POA: Diagnosis present

## 2024-02-09 MED ORDER — LIDOCAINE 5 % EX PTCH
1.0000 | MEDICATED_PATCH | CUTANEOUS | Status: DC
  Administered 2024-02-09: 1 via TRANSDERMAL
  Filled 2024-02-09: qty 1

## 2024-02-09 MED ORDER — LIDOCAINE 5 % EX PTCH: 1.0000 | MEDICATED_PATCH | CUTANEOUS | 0 refills | Status: AC

## 2024-02-09 NOTE — Discharge Instructions (Addendum)
 You can take 650 mg of Tylenol and 600 mg of ibuprofen every 6 hours as needed for pain. You can use ice, heat, muscle creams and other topical pain relievers as well.  Follow-up with your doctor tomorrow.  Return to the emergency department with any worsening symptoms.

## 2024-02-09 NOTE — Telephone Encounter (Signed)
 She did not complain of pain or issues post bronchoscopy nor did she mention this to me when I called her for results.  She needs to be seen in the ED for chest x-ray.  There is a mention that she went to urgent care did she have a chest x-ray then?

## 2024-02-09 NOTE — Telephone Encounter (Addendum)
 Chief Complaint: L rib/breast pain Symptoms: worsening pain s/p bronch Frequency: last night Pertinent Negatives: Patient denies injuries, current SOB/CP, fever, URI sx Disposition: [x] ED /[] Urgent Care (no appt availability in office) / [] Appointment(In office/virtual)/ []  [place] [facility]/ [] Home Care/ [] Refused Recommended Disposition /[] [place] [facility]/ []  Follow-up with PCP Additional Notes: Pt c/o of worsening L rib/breast pain s/p bronchoscopy by Dr. Jayme Cloud. Pt reports procedure was done last week and initially improved, but has worsened since Friday. Of note, pt was previously seen by UC with c/o floaters/blurred vision that provider attributed to low blood sugar. Pt was discharged home and sx resolved after eating a meal. Last night, pt reports having moderate pain that impacted her sleep, but denies current CP/SOB. Pt also stating that during pain episode last night sleeping, she was unable to lay on R side d/t pain. D/t recent procedure/anesthesia, triager advised ED for further evaluation/tx. Patient verbalized understanding and to call back/911 with worsening symptoms. Of note, pt has questions re: Pulm F/U appt/tx plan and INH that Dr. Jayme Cloud mentioned post procedure. Additionally, pt noted that surgery consult is in May. Triager will forward encounter for Dr. Jayme Cloud to review and advise. Patient verbalized understanding and is expecting call back from office for next steps/if F/U appt needs to be scheduled sooner.    Copied from CRM [id number]. Topic: Clinical - Red Word Triage >> Feb 09, 2024  2:39 PM Nila Nephew wrote: Red Word that prompted transfer to Nurse Triage: Patient states soreness persisting following broncoscopy a week ago Concerned she may have some adverse affect. Reason for Disposition  Major surgery in past month  Answer Assessment - Initial Assessment Questions E2C2 Pulmonary Triage - Initial Assessment Questions "Chief Complaint (e.g., cough, sob,  wheezing, fever, chills, sweat or additional symptoms) *Go to specific symptom protocol after initial questions. L breast/rib pain s/o bronch Floaters and blurred vision - went UC on Friday - BS was 80's   "How long have symptoms been present?" Last Monday, worsened since Friday   MEDICINES:     "Have you used your inhalers/maintenance medication?" No If yes, "What medications?" N/a    OXYGEN: "Do you wear supplemental oxygen?" No   1. RESPIRATORY STATUS: "Describe your breathing?" (e.g., wheezing, shortness of breath, unable to speak, severe coughing)      Mild SOB 2. ONSET: "When did this breathing problem begin?"      Monday 3. PATTERN "Does the difficult breathing come and go, or has it been constant since it started?"      constant 4. SEVERITY: "How bad is your breathing?" (e.g., mild, moderate, severe)    - MILD: No SOB at rest, mild SOB with walking, speaks normally in sentences, can lie down, no retractions, pulse < 100.    - MODERATE: SOB at rest, SOB with minimal exertion and prefers to sit, cannot lie down flat, speaks in phrases, mild retractions, audible wheezing, pulse 100-120.    - SEVERE: Very SOB at rest, speaks in single words, struggling to breathe, sitting hunched forward, retractions, pulse > 120      mild 5. RECURRENT SYMPTOM: "Have you had difficulty breathing before?" If Yes, ask: "When was the last time?" and "What happened that time?"      no 6. CARDIAC HISTORY: "Do you have any history of heart disease?" (e.g., heart attack, angina, bypass surgery, angioplasty)      denies 7. LUNG HISTORY: "Do you have any history of lung disease?"  (e.g., pulmonary embolus, asthma, emphysema)  Lung nodule 8. CAUSE: "What do you think is causing the breathing problem?"      unknown 9. OTHER SYMPTOMS: "Do you have any other symptoms? (e.g., dizziness, runny nose, cough, chest pain, fever)     denies  Answer Assessment - Initial Assessment Questions 6.  SEVERITY: "How bad is the pain?"  (e.g., Scale 1-10; mild, moderate, or severe)    - MILD (1-3): doesn't interfere with normal activities     - MODERATE (4-7): interferes with normal activities or awakens from sleep    - SEVERE (8-10): excruciating pain, unable to do any normal activities       6/10 during event last night Now 4/10 Tylenol provides some relief.  Protocols used: Breathing Difficulty-A-AH, Chest Pain-A-AH

## 2024-02-09 NOTE — ED Triage Notes (Signed)
 Pt presents to ED from home C/O pain on L ribcage. Pt reports bronchoscopy with biopsy of L lung 1 week ago, dx lung cancer. Pt states pulmonologist requested she come here for CXR.

## 2024-02-09 NOTE — Telephone Encounter (Signed)
 I spoke with the patient. She did go to [facility] but they did not do a chest xray. She said it is more rib pain then chest pain. She will go to the ED to be evaluated.  Nothing further needed.

## 2024-02-09 NOTE — ED Provider Triage Note (Signed)
 Emergency Medicine Provider Triage Evaluation Note  Amanda Pierce , a 68 y.o. female  was evaluated in triage.  Pt complains of left chest wall pain. Had a bronchoscopy on 4/7, had soreness under the left breast. Has had increasing pain since then in that area.  Review of Systems  Positive: Left chest wall pain Negative:   Physical Exam  LMP  (LMP Unknown)  Gen:   Awake, no distress   Resp:  Normal effort  MSK:   Moves extremities without difficulty  Other:    Medical Decision Making  Medically screening exam initiated at 6:42 PM.  Appropriate orders placed.  Martesha Niedermeier was informed that the remainder of the evaluation will be completed by another provider, this initial triage assessment does not replace that evaluation, and the importance of remaining in the ED until their evaluation is complete.     Phyliss Breen, PA-C 02/09/24 1844

## 2024-02-09 NOTE — ED Provider Notes (Signed)
 [facility] Provider Note    Event Date/Time   First MD Initiated Contact with Patient 02/09/24 2022     (approximate)   History   No chief complaint on file.   HPI  Amanda Pierce is a 68 y.o. female who presents for evaluation of left chest wall pain.  Patient had a bronchoscopy on 4/7 for evaluation of lung nodule that came back positive for non-small cell lung cancer.  She was recommended by her pulmonologist to come to the emergency department for a chest x-ray.      Physical Exam   Triage Vital Signs: ED Triage Vitals  Encounter Vitals Group     BP 02/09/24 1842 (!) 163/91     Systolic BP Percentile --      Diastolic BP Percentile --      Pulse Rate 02/09/24 1842 71     Resp 02/09/24 1842 14     Temp 02/09/24 1842 98.3 F (36.8 C)     Temp Source 02/09/24 1842 Oral     SpO2 02/09/24 1842 99 %     Weight 02/09/24 1844 215 lb (97.5 kg)     Height 02/09/24 1844 5\' 8"  (1.727 m)     Head Circumference --      Peak Flow --      Pain Score 02/09/24 1843 6     Pain Loc --      Pain Education --      Exclude from Growth Chart --     Most recent vital signs: Vitals:   02/09/24 1842 02/09/24 2034  BP: (!) 163/91 (!) 141/83  Pulse: 71 61  Resp: 14 16  Temp: 98.3 F (36.8 C)   SpO2: 99% 100%   General: Awake, no distress.  CV:  Good peripheral perfusion.  RRR. Resp:  Normal effort.  CTAB. Abd:  No distention.     ED Results / Procedures / Treatments   Labs (all labs ordered are listed, but only abnormal results are displayed) Labs Reviewed - No data to display   EKG  ED provider interpretation: Normal sinus rhythm with sinus arrhythmia.   RADIOLOGY  Chest x-ray obtained, interpreted the images as well as reviewed the radiologist report which shows a known left lower lobe lung mass but is otherwise normal.  Vent. rate 61 BPM PR interval 166 ms QRS duration 88 ms QT/QTcB 418/420 ms P-R-T axes 60 -7  9  PROCEDURES:  Critical Care performed: No  Procedures   MEDICATIONS ORDERED IN ED: Medications  lidocaine (LIDODERM) 5 % 1 patch (has no administration in time range)     IMPRESSION / MDM / ASSESSMENT AND PLAN / ED COURSE  I reviewed the triage vital signs and the nursing notes.                             68 year old female presents for evaluation of left chest wall pain.  Blood pressure is elevated otherwise vital signs are stable.  Patient NAD on exam.  Differential diagnosis includes, but is not limited to, muscle strain, pulmonary embolism, rib fracture, postop pain.  Patient's presentation is most consistent with acute complicated illness / injury requiring diagnostic workup.  Chest x-ray showed a long mass which is why patient had the bronchoscopy last week.  Was otherwise normal.  Did consider pulmonary embolism.  Based on Wells score patient is moderate risk given her cancer diagnosis and recent bronchoscopy.  Discussed working this up with blood work and CT angiogram.  Patient does have an allergy to contrast dye.  Patient declined CT angio at this time.  Although patient is moderate risk, I do have a low suspicion for PE.  Pain is reproducible and only started after she put her bra on.  Patient also endorses doing lots of heavy lifting in preparation for the holiday.  I feel that musculoskeletal pain is more likely than PE.  I did review return precautions with the patient.  She is aware she can return at any point with worsening symptoms and can be worked up for this.  She has a follow-up appointment scheduled with her doctor tomorrow.  She was given a lidocaine patch and a prescription for this.  We discussed over-the-counter pain management using Tylenol and ibuprofen.  She voiced understanding, all questions were answered and she was stable at discharge.      FINAL CLINICAL IMPRESSION(S) / ED DIAGNOSES   Final diagnoses:  Chest wall pain     Rx / DC Orders    ED Discharge Orders          Ordered    lidocaine (LIDODERM) 5 %  Every 24 hours        02/09/24 2219             Note:  This document was prepared using Dragon voice recognition software and may include unintentional dictation errors.   Phyliss Breen, PA-C 02/09/24 2220    Twilla Galea, MD 02/09/24 [phone]

## 2024-02-09 NOTE — ED Notes (Signed)
 Pt is CAOx4, breathing normally, and normal in color. Pt is sitting upright in a chair and does not appear to be in any distress at this time. Pt denies any needs. Pain and vitals reassessed.

## 2024-02-10 ENCOUNTER — Telehealth: Payer: Self-pay

## 2024-02-10 NOTE — Telephone Encounter (Signed)
 The thing that would cause pain after the bronchoscopy usually is daily after bronchoscopy and is usually due to lung collapse.  Her chest x-ray did not show that.  The area that we biopsied was lower in her lung and not around her breast.  Does she have a rash where this pain is located?  Wonder if this may be due to shingles.  I have reviewed the x-ray taken last night and everything looked good.  She should continue the pain medications provided by the ED. We can see her later this afternoon if she is still concerned.

## 2024-02-10 NOTE — Telephone Encounter (Signed)
 When should the patient follow up? She is scheduled to see you on 4/22.

## 2024-02-10 NOTE — Telephone Encounter (Signed)
 Copied from CRM [id number]. Topic: Clinical - Medical Advice >> Feb 10, 2024  9:56 AM Roseanne Cones wrote: Reason for CRM: Patient was seen in ED yesterday, they did a EKG and Chest Xray (Patient also spoke with Merritt Ables and a triage nurse yesterday prior to going to the ED) - they prescribed some medications for the pain, the pain has lessen but it's still there under the left breast around the ribs where her bronchoscopy was done. Patient was advised to reach out to Dr. Isidro Margo office to see if she needs to be seen today or this week or if she can wait until her appointment next week. Please call patient to discuss next steps.

## 2024-02-10 NOTE — Telephone Encounter (Signed)
 I spoke with the patient. She said her pain is better today with the medications the ED gave her. She did look and does not have a rash where her pain is. She said as of right now she would like to hold off on an appt for today. She does have an appt scheduled for 4/22. She will call back if she feels like she needs to be seen before then.  Nothing further needed.'

## 2024-02-17 ENCOUNTER — Encounter: Payer: Self-pay | Admitting: Pulmonary Disease

## 2024-02-17 ENCOUNTER — Ambulatory Visit: Admitting: Pulmonary Disease

## 2024-02-17 ENCOUNTER — Ambulatory Visit (INDEPENDENT_AMBULATORY_CARE_PROVIDER_SITE_OTHER): Admitting: Pulmonary Disease

## 2024-02-17 VITALS — BP 140/80 | HR 64 | Temp 97.7°F | Ht 68.0 in | Wt 215.8 lb

## 2024-02-17 DIAGNOSIS — Z01811 Encounter for preprocedural respiratory examination: Secondary | ICD-10-CM | POA: Diagnosis not present

## 2024-02-17 DIAGNOSIS — E039 Hypothyroidism, unspecified: Secondary | ICD-10-CM | POA: Diagnosis not present

## 2024-02-17 DIAGNOSIS — K219 Gastro-esophageal reflux disease without esophagitis: Secondary | ICD-10-CM

## 2024-02-17 DIAGNOSIS — C3492 Malignant neoplasm of unspecified part of left bronchus or lung: Secondary | ICD-10-CM | POA: Diagnosis not present

## 2024-02-17 LAB — PULMONARY FUNCTION TEST
DL/VA % pred: 116 %
DL/VA: 4.7 ml/min/mmHg/L
DLCO unc % pred: 96 %
DLCO unc: 21.35 ml/min/mmHg
FEF 25-75 Post: 2.5 L/s
FEF 25-75 Pre: 1.96 L/s
FEF2575-%Change-Post: 27 %
FEF2575-%Pred-Post: 114 %
FEF2575-%Pred-Pre: 89 %
FEV1-%Change-Post: 6 %
FEV1-%Pred-Post: 87 %
FEV1-%Pred-Pre: 81 %
FEV1-Post: 2.35 L
FEV1-Pre: 2.21 L
FEV1FVC-%Change-Post: 4 %
FEV1FVC-%Pred-Pre: 101 %
FEV6-%Change-Post: 1 %
FEV6-%Pred-Post: 84 %
FEV6-%Pred-Pre: 83 %
FEV6-Post: 2.88 L
FEV6-Pre: 2.83 L
FEV6FVC-%Change-Post: 0 %
FEV6FVC-%Pred-Post: 104 %
FEV6FVC-%Pred-Pre: 103 %
FVC-%Change-Post: 1 %
FVC-%Pred-Post: 81 %
FVC-%Pred-Pre: 80 %
FVC-Post: 2.89 L
FVC-Pre: 2.85 L
Post FEV1/FVC ratio: 81 %
Post FEV6/FVC ratio: 100 %
Pre FEV1/FVC ratio: 77 %
Pre FEV6/FVC Ratio: 99 %
RV % pred: 43 %
RV: 1.03 L
TLC % pred: 92 %
TLC: 5.23 L

## 2024-02-17 MED ORDER — BREZTRI AEROSPHERE 160-9-4.8 MCG/ACT IN AERO: 2.0000 | INHALATION_SPRAY | Freq: Two times a day (BID) | RESPIRATORY_TRACT | 0 refills | Status: DC

## 2024-02-17 NOTE — Progress Notes (Signed)
 Full PFT completed today ? ?

## 2024-02-17 NOTE — Patient Instructions (Signed)
 Full PFT completed today ? ?

## 2024-02-17 NOTE — Progress Notes (Signed)
 Subjective:    Patient ID: Amanda Pierce, female    DOB: 1956-04-17, 68 y.o.   MRN: [id number]  Patient Care Team: Raina Bunting, DO as PCP - General (Family Medicine) Timmy Forbes, MD as Consulting Physician (Oncology) Marc Senior, MD as Consulting Physician (Pulmonary Disease) Drake Gens, RN as Oncology Nurse Navigator  Chief Complaint  Patient presents with   Follow-up    Post robotic bronchoscopy 02 February 2024.    BACKGROUND/INTERVAL:Patient is a 68 year old lifelong never smoker with a history of iron  deficiency anemia, who presents for follow-up of a lung nodule/mass noted on the left lower lobe.  This was incidentally found during cardiac CT. She underwent robotic assisted navigational bronchoscopy on 02 February 2024.  HPI Discussed the use of AI scribe software for clinical note transcription with the patient, who gave verbal consent to proceed.  History of Present Illness   Amanda Pierce is a 68 year old female who presents for follow-up after a robotic-assisted bronchoscopy.  She was diagnosed with non-small cell carcinoma.  Suspect that this is going to be adenocarcinoma.  She experienced a fall following the procedure, resulting in significant tenderness and swelling in the chest area, described as 'exquisitely tender'. The soreness was present even before the procedure, which she attributed to wearing an underwire bra. She has since stopped using the bra. No rash is present, and breathing is fine with no other respiratory complaints.  Pain has resolved since she stopped using the underwire bra.  She has a history of Hashimoto's thyroiditis, her PET/CT was consistent with this. She inquired about a possible connection between her lung cancer and her thyroid, but these are separate issues.  She is concerned about the origin of her lung cancer, questioning potential environmental factors like radon exposure. The lung mass was discovered incidentally during a heart  scan, which she considers fortunate as it was caught in early stages.   She has an upcoming appointment with Dr. Adair Hollingshead on 13 May for consideration for robotic lung resection.  She had PFTs performed today and her lung function is essentially normal.     Review of Systems A 10 point review of systems was performed and it is as noted above otherwise negative.   Patient Active Problem List   Diagnosis Date Noted   Non-small cell cancer of left lung (HCC) 02/04/2024   Lung nodule, solitary 01/20/2024   Sprain of shoulder and upper arm 05/09/2020   Impingement syndrome of shoulder region 05/09/2020   Stomach irritation    Gastric polyp    Special screening for malignant neoplasms, colon    Pain in joint of right shoulder 04/15/2019   Iron  deficiency anemia 02/26/2019   Vitamin D  deficiency 01/04/2019   Elevated BP without diagnosis of hypertension 12/07/2018   Osteopenia of spine 09/17/2018   Hiatal hernia 08/28/2018   Obesity (BMI 30.0-34.9) 05/30/2018   Gastroesophageal reflux disease without esophagitis 05/30/2018   Pre-diabetes 05/30/2018   Pure hypercholesterolemia 05/30/2018   Hypothyroidism (acquired) 05/29/2018   Suspected sleep apnea 05/29/2018    Social History   Tobacco Use   Smoking status: Never   Smokeless tobacco: Never  Substance Use Topics   Alcohol use: Never    Allergies  Allergen Reactions   Aspirin Anaphylaxis   Contrast Media [Iodinated Contrast Media] Nausea And Vomiting and Palpitations   Elemental Sulfur Hives and Rash   Ibuprofen Anaphylaxis   Latex Rash    Any latex adhesive, rubber compounds:  examples: ekg  leads,etc.   Penicillins Anaphylaxis    Did it involve swelling of the face/tongue/throat, SOB, or low BP? Yes Did it involve sudden or severe rash/hives, skin peeling, or any reaction on the inside of your mouth or nose? No Did you need to seek medical attention at a hospital or doctor's office? Yes When did it last happen?       40 years ago/ Patient was in the hospital when happend If all above answers are "NO", may proceed with cephalosporin use.   Rubbing Alcohol [Alcohol] Other (See Comments)    Severe skin warmth   Adhesive [Tape]    Medical Adhesive Remover     Other reaction(s): Not available   Sulfa Antibiotics     Other reaction(s): Not available    Current Meds  Medication Sig   Cholecalciferol (VITAMIN D3) 125 MCG (5000 UT) CAPS Take 1 capsule (5,000 Units total) by mouth daily. For 8 weeks, then start Vitamin D3 2,000 units daily (OTC) (Patient taking differently: Take 5,000 Units by mouth once a week. For 8 weeks on Saturday then start Vitamin D3 2,000 units daily (OTC))   fexofenadine (ALLEGRA) 180 MG tablet Take 180 mg by mouth daily as needed for allergies or rhinitis.   fluticasone  (FLONASE ) 50 MCG/ACT nasal spray PLACE 2 SPRAYS INTO BOTH NOSTRILS DAILY. USE FOR 4-6 WEEKS THEN STOP AND USE SEASONALLY OR AS NEEDED (Patient taking differently: Place 2 sprays into both nostrils daily as needed. Use for 4-6 weeks then stop and use seasonally or as needed.)   ipratropium (ATROVENT ) 0.06 % nasal spray Place 2 sprays into both nostrils 4 (four) times daily. (Patient taking differently: Place 2 sprays into both nostrils 4 (four) times daily as needed.)   montelukast  (SINGULAIR ) 10 MG tablet TAKE 1 TABLET BY MOUTH EVERYDAY AT BEDTIME   nystatin cream (MYCOSTATIN) Apply 1 application  topically as needed.   phenazopyridine  (PYRIDIUM ) 200 MG tablet Take 1 tablet (200 mg total) by mouth 3 (three) times daily. (Patient taking differently: Take 200 mg by mouth 3 (three) times daily as needed.)   SYNTHROID  125 MCG tablet Take 125 mcg by mouth daily.   [DISCONTINUED] budeson-glycopyrrolate-formoterol (BREZTRI  AEROSPHERE) 160-9-4.8 MCG/ACT AERO inhaler Inhale 2 puffs into the lungs in the morning and at bedtime.    Immunization History  Administered Date(s) Administered   PFIZER(Purple Top)SARS-COV-2  Vaccination 01/08/2020, 01/29/2020, 10/24/2020   PNEUMOCOCCAL CONJUGATE-20 08/17/2021   Tdap 08/28/2018        Objective:     BP (!) 140/80 (BP Location: Left Arm, Patient Position: Sitting, Cuff Size: Normal)   Pulse 64   Temp 97.7 F (36.5 C) (Temporal)   Ht 5\' 8"  (1.727 m)   Wt 215 lb 12.8 oz (97.9 kg)   LMP  (LMP Unknown)   SpO2 99%   BMI 32.81 kg/m   SpO2: 99 %  GENERAL: Well-developed, well-nourished woman, no acute distress. HEAD: Normocephalic, atraumatic.  EYES: Pupils equal, round, reactive to light.  No scleral icterus.  MOUTH: Dentition intact, oral mucosa moist.  No thrush. NECK: Supple.  Prominent right thyroid lobe. Trachea midline. No JVD.  No adenopathy. PULMONARY: Good air entry bilaterally.  No adventitious sounds. CARDIOVASCULAR: S1 and S2. Regular rate and rhythm.  No rubs, murmurs or gallops heard. ABDOMEN: Benign MUSCULOSKELETAL: No joint deformity, no clubbing, no edema.  NEUROLOGIC: No overt focal deficit, no gait disturbance, speech is fluent. SKIN: Intact,warm,dry. PSYCH: Mood and behavior normal.  ARISCAT score: 27 points, intermediate risk 13.3% risk of  in-hospital postop pulmonary complications.  Surgical pathology     Status: None   Collection Time: 02/02/24 12:00 AM  Result Value Ref Range   SURGICAL PATHOLOGY      SURGICAL PATHOLOGY [facility] [address] [place], [place] [address] Telephone [phone] or [phone] Fax [phone]  REPORT OF SURGICAL PATHOLOGY   Accession #: [id number] Patient Name: SOMA, BACHAND Visit # : [id number]  MRN: [id number] Physician: Coralie Derrick DOB/Age June 22, 1956 (Age: 74) Gender: F Collected Date: 02/02/2024 Received Date: 02/02/2024  FINAL DIAGNOSIS       1. Lung, biopsy, LLL :       - MINUTE FRAGMENT WITH RARE ATYPICAL CELLS.       Diagnosis Note : Per [facility] the patient is a lifelong never smoker who was found to      have an incidental left lower  lobe lung nodule/mass noted on cardiac CT. Dr.      Viva Grise was notified on 02/04/2024.      This case underwent intradepartmental consultation and Dr. Bernetta Brilliant concurs with      the interpretation.      See concurrent case [id number].      There is insufficient material for ancillary molecular testing.      DATE SIGNED OUT: 02/04/2024 ELECTRONIC  SIGNATURE : Brunetta Capes Md, Alexandria Ida , Pathologist, Electronic Signature  MICROSCOPIC DESCRIPTION  CASE COMMENTS STAINS USED IN DIAGNOSIS: H&E *RECUT 2 SLIDES *RECUT 2 SLIDES *RECUT 2 SLIDES *RECUT 2 SLIDES    CLINICAL HISTORY  SPECIMEN(S) OBTAINED 1. Lung, biopsy, LLL  SPECIMEN COMMENTS: 1. CA vs infection SPECIMEN CLINICAL INFORMATION: 1. 68 yo non smoker with LLL 2.7 cm nodule/mass    Gross Description 1. "LLL bx", received in formalin is a 0.2 x 0.1 x 0.1 cm aggregate of multiple minute, hemorrhagic tissue fragments. The specimen is filtered and submitted in toto in 1 block (1A); the specimen may not survive processing.      [facility]G 02/02/2024        Report signed out from the following location(s) [place] [address], [place] [address] CLIA #: [id number]  [facility] [address] [place], [place] [address] CLIA #: [id number]   Cytology - Non PAP; LLL     Status: None   Collection Time: 02/02/24 12:00 AM  Result Value Ref Range   CYTOLOGY - NON GYN      CYTOLOGY - NON PAP [facility] [address] [place], [place] [address] Telephone [phone] or [phone] Fax [phone]  CYTOPATHOLOGY REPORT   Accession #: [id number] Patient Name: SYNCERE, KAMINSKI Visit # : [id number]  MRN: [id number] Physician: Coralie Derrick DOB/Age [birth year] (Age: 56) Gender: F Collected Date: 02/02/2024 Received Date: 02/02/2024  FINAL DIAGNOSIS STATEMENT Of SPECIMEN ADEQUACY:  INTERPRETATION(S):      - NEGATIVE FOR MALIGNANCY.      -  REACTIVE BRONCHIAL CELLS.      - POSITIVE FOR MALIGNANCY.      - NON-SMALL CELL CARCINOMA IS PRESENT.   Diagnosis Note : Per [facility] the patient is a lifelong never smoker who was found to have an incidental left lower lobe lung nodule/mass noted on cardiac CT.Non-small cell carcinoma is present in the ThinPrep slide of the left lower lobe FNA specimen and there was insufficient material for a cell block.Further subclassification of the non-small cell carcinoma is not  possible.Dr.Ghali Morissette was notified on 02/04/2024. This case underwent intradepartmental  consultation and Dr. Bernetta Brilliant concurs with the interpretation. See concurrent case [id number]. There is insufficient material for ancillary molecular testing.    ELECTRONIC SIGNATURE : Rubinas Md, Alexandria Ida , Sports administrator, Electronic Signature   CASE COMMENTS   CLINICAL HISTORY  SOURCE OF SPECIMEN(S) Bronchial Brushing Lung, Fine Needle Aspiration  SPECIMEN COMMENTS: 1. R/O CA vs infection SPECIMEN CLINICAL INFORMATION: 1. 68 yo non smoker with LLL 2.7 cm nodule/mass    Gross Description Specimen: Received is/are 30cc of clear fluid in cytolyt and two slides      Prepared:      # Smears: 2      # Concentration Technique Slides (i.e. ThinPrep): 1      # Cell Block: QNS-Quantity not sufficient to prepare cell block      # Diff-Quick Stain: 0      SEE CONCURRENT CASE [id number] Specimen: Received is/are 40cc of pink fluid in cytolyt      Prepared:      # Smears: 0       # Concentration Technique Slides (i.e. ThinPrep): 1      # Cell Block: QNS-Quantity not sufficient to prepare cell block      # Diff-Quick Stain: 0      SEE CONCURRENT CASE [id number]        Report signed out from the following location(s) [place] [address], [place] [address] CLIA #: [id number]  [facility] [address] [place], [place] [address] CLIA #: [id number]   Culture, Respiratory  w Gram Stain     Status: None   Collection Time: 02/02/24  1:24 PM   Specimen: Bronchoalveolar Lavage; Respiratory  Result Value Ref Range   Specimen Description      BRONCHIAL ALVEOLAR LAVAGE Performed at [facility], [address] [place], [place] [address]    Special Requests      Normal Performed at [facility], [address] [place], [place] [address]    Gram Stain NO WBC SEEN NO ORGANISMS SEEN     Culture      NO GROWTH 2 DAYS Performed at [facility], [address], [place], [place] [address]    Report Status 02/05/2024 FINAL   Glucose, capillary     Status: None   Collection Time: 02/02/24  1:24 PM  Result Value Ref Range   Glucose-Capillary 89 70 - 99 mg/dL    Comment: Glucose reference range applies only to samples taken after fasting for at least 8 hours.  Pulmonary function test     Status: None (Preliminary result)   Collection Time: 02/17/24 10:00 AM  Result Value Ref Range   FVC-Pre 2.85 L   FVC-%Pred-Pre 80 %   FVC-Post 2.89 L   FVC-%Pred-Post 81 %   FVC-%Change-Post 1 %   FEV1-Pre 2.21 L   FEV1-%Pred-Pre 81 %   FEV1-Post 2.35 L   FEV1-%Pred-Post 87 %   FEV1-%Change-Post 6 %   FEV6-Pre 2.83 L   FEV6-%Pred-Pre 83 %   FEV6-Post 2.88 L   FEV6-%Pred-Post 84 %   FEV6-%Change-Post 1 %   Pre FEV1/FVC ratio 77 %   FEV1FVC-%Pred-Pre 101 %   Post FEV1/FVC ratio 81 %   FEV1FVC-%Change-Post 4 %   Pre FEV6/FVC Ratio 99 %   FEV6FVC-%Pred-Pre 103 %   Post FEV6/FVC ratio 100 %   FEV6FVC-%Pred-Post 104 %   FEV6FVC-%Change-Post 0 %   FEF 25-75 Pre 1.96 L/sec   FEF2575-%Pred-Pre 89 %  FEF 25-75 Post 2.50 L/sec   FEF2575-%Pred-Post 114 %   FEF2575-%Change-Post 27 %   RV 1.03 L   RV % pred 43 %   TLC 5.23 L   TLC % pred 92 %   DLCO unc 21.35 ml/min/mmHg   DLCO unc % pred 96 %   DL/VA 4.09 ml/min/mmHg/L   DL/VA % pred 811 %   Assessment & Plan:     ICD-10-CM   1. Non-small cell cancer of left lung (HCC)  C34.92     2.  Gastroesophageal reflux disease without esophagitis  K21.9     3. Hypothyroidism (acquired)  E03.9     4. Pre-operative respiratory examination  Z01.811      Assessment and Plan    Lung cancer Lung cancer diagnosis confirmed. She is scheduled for thoracic surgery with robotic assistance to remove the affected lung section. Pre-operative pulmonary function tests are planned to ensure adequate lung reserve (patient underwent these today and these were normal). The robotic surgery typically results in a shorter hospital stay (1-3 days). Post-surgical pathology will determine the need for further treatment. If lymph nodes are clear, no chemotherapy or radiation will be required. The etiology is uncertain, but environmental factors such as radon exposure are possible contributors. Early detection is favorable for prognosis. - Perform pre-operative pulmonary function tests -done: Normal. - Proceed with thoracic surgery for lung resection with robotic assistance - Send surgical specimens for pathological evaluation - Evaluate post-operative recovery and pathology results to determine further treatment  Hashimoto's thyroiditis Hashimoto's thyroiditis is present, characterized by thyroid inflammation and goiter. It is unrelated to the lung cancer diagnosis and is considered a separate condition.      Advised if symptoms do not improve or worsen, to please contact office for sooner follow up or seek emergency care.    I spent 32 minutes of dedicated to the care of this patient on the date of this encounter to include pre-visit review of records, face-to-face time with the patient discussing conditions above, post visit ordering of testing, clinical documentation with the electronic health record, making appropriate referrals as documented, and communicating necessary findings to members of the patients care team.     C. Chloe Counter, MD Advanced Bronchoscopy [facility] [place]  [facility]    *This note was generated using voice recognition software/Dragon and/or AI transcription program.  Despite best efforts to proofread, errors can occur which can change the meaning. Any transcriptional errors that result from this process are unintentional and may not be fully corrected at the time of dictation.

## 2024-02-17 NOTE — Patient Instructions (Signed)
 VISIT SUMMARY:  You came in today for a follow-up after your robotic-assisted bronchoscopy. We discussed the tenderness and swelling in your chest area which you had before the procedure but became somewhat worse after the procedure. We also reviewed your history of Hashimoto's thyroiditis and addressed your concerns about the potential connection between your thyroid condition and your lung condition. Additionally, we talked about the possible environmental factors contributing to your lung condition, which was discovered incidentally during a heart scan.  YOUR PLAN:  -LUNG CANCER: Lung cancer is a disease where abnormal cells in the lung grow uncontrollably. You are scheduled for thoracic surgery with robotic assistance to remove the affected section of your lung. Before the surgery, you will undergo pulmonary function tests to ensure your lungs are strong enough. The surgery is less invasive than previous methods, which usually means a shorter hospital stay of 1-3 days. After the surgery, the removed tissue will be examined to determine if further treatment is needed. If your lymph nodes are clear, you will not need chemotherapy or radiation. The cause of your lung cancer is uncertain, but environmental factors like radon exposure could be contributors. Early detection is favorable for your prognosis.  -HASHIMOTO'S THYROIDITIS: Hashimoto's thyroiditis is a condition where your immune system attacks your thyroid, causing inflammation and a goiter. This condition is unrelated to your lung cancer diagnosis and is being managed separately.  INSTRUCTIONS:  Please complete the pre-operative pulmonary function tests as scheduled. Follow up with thoracic surgery for lung resection with robotic assistance. After the surgery, we will evaluate your recovery and the pathology results to determine if further treatment is needed.

## 2024-02-23 ENCOUNTER — Encounter: Payer: Self-pay | Admitting: Pulmonary Disease

## 2024-03-09 ENCOUNTER — Ambulatory Visit
Attending: Thoracic Surgery (Cardiothoracic Vascular Surgery) | Admitting: Thoracic Surgery (Cardiothoracic Vascular Surgery)

## 2024-03-09 ENCOUNTER — Encounter: Payer: Self-pay | Admitting: *Deleted

## 2024-03-09 ENCOUNTER — Other Ambulatory Visit: Payer: Self-pay | Admitting: *Deleted

## 2024-03-09 ENCOUNTER — Encounter: Payer: Self-pay | Admitting: Thoracic Surgery (Cardiothoracic Vascular Surgery)

## 2024-03-09 VITALS — BP 147/84 | HR 62 | Resp 18 | Ht 68.0 in | Wt 217.4 lb

## 2024-03-09 DIAGNOSIS — C3492 Malignant neoplasm of unspecified part of left bronchus or lung: Secondary | ICD-10-CM | POA: Diagnosis not present

## 2024-03-09 NOTE — H&P (View-Only) (Signed)
 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  She is a lifelong non-smoker.  She recently had a CT for coronary calcium scoring because of a strong family history of CAD.  Her coronary score was 0.  However she was noted to have a 2.7 cm left lower lobe lung nodule.  She had a PET/CT which showed the nodule is hypermetabolic.  There was no evidence of distant disease.  She underwent robotic bronchoscopy which showed non-small cell carcinoma.  She denies chest pain, pressure, tightness, or shortness of breath.  No cough or wheezing.  No change in appetite or weight loss.  Amanda Pierce Score: At the time of surgery this patient's most appropriate activity status/level should be described as: [x]     0    Normal activity, no symptoms []     1    Restricted in physical strenuous activity but ambulatory, able to do out light work []     2    Ambulatory and capable of self care, unable to do work activities, up and about >50 % of waking hours                              []     3    Only limited self care, in bed greater than 50% of waking hours []     4    Completely disabled, no self care, confined to bed or chair []     5    Moribund   Past Medical History:  Diagnosis Date   Allergy    Anemia    Asthma    GERD (gastroesophageal reflux disease)    History of hiatal hernia    Hypothyroidism    Iron  deficiency anemia     Left lower lobe pulmonary nodule 12/2023   Osteopenia    Pre-diabetes    Pure hypercholesterolemia    Suspected sleep apnea    Thyroid disease    hypothyroid    Past Surgical History:  Procedure Laterality Date   BREAST MASS EXCISION Left 1974   benign   BRONCHOSCOPY, WITH BIOPSY USING ELECTROMAGNETIC NAVIGATION Left 02/02/2024   Procedure: BRONCHOSCOPY, WITH BIOPSY USING ELECTROMAGNETIC NAVIGATION;  Surgeon: Marc Senior, MD;  Location: [facility];  Service: Pulmonary;  Laterality: Left;   COLONOSCOPY WITH PROPOFOL  N/A 10/28/2019   Procedure: COLONOSCOPY WITH PROPOFOL ;  Surgeon: Irby Mannan, MD;  Location: [facility] ENDOSCOPY;  Service: Endoscopy;  Laterality: N/A;   ENDOSCOPIC MUCOSAL RESECTION N/A 11/22/2019   Procedure: ENDOSCOPIC MUCOSAL RESECTION;  Surgeon: Brice Campi Albino Alu., MD;  Location: [facility] ENDOSCOPY;  Service: Gastroenterology;  Laterality: N/A;   ESOPHAGOGASTRODUODENOSCOPY (EGD) WITH PROPOFOL  N/A 10/28/2019   Procedure: ESOPHAGOGASTRODUODENOSCOPY (EGD) WITH PROPOFOL ;  Surgeon: Irby Mannan, MD;  Location: [facility] ENDOSCOPY;  Service: Endoscopy;  Laterality: N/A;   ESOPHAGOGASTRODUODENOSCOPY (EGD) WITH PROPOFOL  N/A 11/22/2019   Procedure: ESOPHAGOGASTRODUODENOSCOPY (EGD) WITH PROPOFOL ;  Surgeon: Brice Campi Albino Alu., MD;  Location: [facility]  ENDOSCOPY;  Service: Gastroenterology;  Laterality: N/A;   HEMOSTASIS CLIP PLACEMENT  11/22/2019   Procedure: HEMOSTASIS CLIP PLACEMENT;  Surgeon: Normie Becton., MD;  Location: [facility] ENDOSCOPY;  Service: Gastroenterology;;   HEMOSTASIS CONTROL  11/22/2019   Procedure: HEMOSTASIS CONTROL;  Surgeon: Normie Becton., MD;  Location: [facility] ENDOSCOPY;  Service: Gastroenterology;;  epi    SUBMUCOSAL LIFTING INJECTION  11/22/2019   Procedure: SUBMUCOSAL LIFTING INJECTION;  Surgeon: Normie Becton., MD;  Location: [facility] ENDOSCOPY;  Service: Gastroenterology;;   THYROIDECTOMY, PARTIAL Left 1982   TUBAL LIGATION      VIDEO BRONCHOSCOPY WITH ENDOBRONCHIAL ULTRASOUND Left 02/02/2024   Procedure: BRONCHOSCOPY, WITH EBUS;  Surgeon: Marc Senior, MD;  Location: [facility] ORS;  Service: Pulmonary;  Laterality: Left;   WISDOM TOOTH EXTRACTION      Family History  Problem Relation Age of Onset   Cancer Mother        Breast, Paget's removed nipple 63, breast 2001   Colon polyps Mother    Paget's disease of bone Mother    Hypertension Mother    Hyperlipidemia Mother    Heart disease Father    Heart attack Father    Heart disease Maternal Grandfather    Stroke Paternal Grandmother    Colon cancer Neg Hx    Esophageal cancer Neg Hx    Inflammatory bowel disease Neg Hx    Liver disease Neg Hx    Pancreatic cancer Neg Hx    Rectal cancer Neg Hx    Stomach cancer Neg Hx     Social History Social History   Tobacco Use   Smoking status: Never   Smokeless tobacco: Never  Vaping Use   Vaping status: Never Used  Substance Use Topics   Alcohol use: Never   Drug use: Never    Current Outpatient Medications  Medication Sig Dispense Refill   Cholecalciferol (VITAMIN D3) 125 MCG (5000 UT) CAPS Take 1 capsule (5,000 Units total) by mouth daily. For 8 weeks, then start Vitamin D3 2,000 units daily (OTC) (Patient taking differently: Take 5,000 Units by mouth once a week. For 8 weeks on Saturday then start Vitamin D3 2,000 units daily (OTC)) 90 capsule    fexofenadine (ALLEGRA) 180 MG tablet Take 180 mg by mouth daily as needed for allergies or rhinitis.     fluticasone  (FLONASE ) 50 MCG/ACT nasal spray PLACE 2 SPRAYS INTO BOTH NOSTRILS DAILY. USE FOR 4-6 WEEKS THEN STOP AND USE SEASONALLY OR AS NEEDED (Patient taking differently: Place 2 sprays into both nostrils daily as needed. Use for 4-6 weeks then stop and use seasonally or as needed.) 48 mL 0   ipratropium (ATROVENT ) 0.06 % nasal spray Place 2 sprays into both nostrils 4 (four) times daily. (Patient taking differently: Place 2 sprays into both nostrils 4  (four) times daily as needed.) 15 mL 12   montelukast  (SINGULAIR ) 10 MG tablet TAKE 1 TABLET BY MOUTH EVERYDAY AT BEDTIME 30 tablet 0   nystatin cream (MYCOSTATIN) Apply 1 application  topically as needed.     phenazopyridine  (PYRIDIUM ) 200 MG tablet Take 1 tablet (200 mg total) by mouth 3 (three) times daily. (Patient taking differently: Take 200 mg by mouth 3 (three) times daily as needed.) 6 tablet 0   SYNTHROID  125 MCG tablet Take 125 mcg by mouth daily.     No current facility-administered medications for this visit.    Allergies  Allergen Reactions   Aspirin Anaphylaxis   Contrast Media [Iodinated Contrast Media] Nausea And  Vomiting and Palpitations   Elemental Sulfur Hives and Rash   Ibuprofen Anaphylaxis   Latex Rash    Any latex adhesive, rubber compounds:  examples: ekg leads,etc.   Penicillins Anaphylaxis    Did it involve swelling of the face/tongue/throat, SOB, or low BP? Yes Did it involve sudden or severe rash/hives, skin peeling, or any reaction on the inside of your mouth or nose? No Did you need to seek medical attention at a hospital or doctor's office? Yes When did it last happen?      40 years ago/ Patient was in the hospital when happend If all above answers are "NO", may proceed with cephalosporin use.   Rubbing Alcohol [Alcohol] Other (See Comments)    Severe skin warmth   Adhesive [Tape]    Medical Adhesive Remover     Other reaction(s): Not available   Sulfa Antibiotics     Other reaction(s): Not available    Review of Systems  Constitutional:  Negative for activity change, appetite change and unexpected weight change.       Decreased energy  HENT:  Negative for trouble swallowing and voice change.   Respiratory:  Negative for cough, shortness of breath and wheezing.   Cardiovascular:  Negative for chest pain and leg swelling.  Gastrointestinal:  Negative for abdominal distention.  Musculoskeletal:  Negative for arthralgias.  Skin:        Itching   Hematological:  Does not bruise/bleed easily.  Psychiatric/Behavioral:         Unusual stress  All other systems reviewed and are negative.   BP (!) 147/84 (BP Location: Right Arm, Patient Position: Sitting, Cuff Size: Normal)   Pulse 62   Resp 18   Ht 5\' 8"  (1.727 m)   Wt 217 lb 6.4 oz (98.6 kg)   LMP  (LMP Unknown)   SpO2 98% Comment: RA  BMI 33.06 kg/m  Physical Exam Vitals reviewed.  Constitutional:      Appearance: She is obese.  HENT:     Head: Normocephalic and atraumatic.  Eyes:     General: No scleral icterus.    Extraocular Movements: Extraocular movements intact.  Cardiovascular:     Rate and Rhythm: Normal rate and regular rhythm.     Heart sounds: Normal heart sounds. No murmur heard.    No friction rub. No gallop.  Pulmonary:     Effort: Pulmonary effort is normal. No respiratory distress.     Breath sounds: Normal breath sounds. No wheezing or rales.  Abdominal:     General: There is no distension.     Palpations: Abdomen is soft.  Musculoskeletal:     Cervical back: Neck supple.  Lymphadenopathy:     Cervical: No cervical adenopathy.  Skin:    General: Skin is warm and dry.  Neurological:     General: No focal deficit present.     Mental Status: She is alert and oriented to person, place, and time.     Cranial Nerves: No cranial nerve deficit.     Motor: No weakness.    Diagnostic Tests: NUCLEAR MEDICINE PET SKULL BASE TO THIGH   TECHNIQUE: 11.52 mCi F-18 FDG was injected intravenously. Full-ring PET imaging was performed from the skull base to thigh after the radiotracer. CT data was obtained and used for attenuation correction and anatomic localization.   Fasting blood glucose: 112 mg/dl   COMPARISON:  Cardiac CT 01/02/2024   FINDINGS: Mediastinal blood pool activity: SUV max 2.8   Liver activity:  SUV max 3.1   NECK: There is diffuse uptake involving the right side of the thyroid gland with maximum SUV approaching 9.0. An  aggressive or malignant process is possible. Recommend further workup such as thyroid ultrasound when appropriate. Otherwise no specific abnormal radiotracer uptake seen along lymph node change of the submandibular, posterior triangle or internal jugular regions. The visualized intracranial compartment has near symmetric uptake.   Incidental CT findings: Visualized paranasal sinuses and mastoid air cells are clear. Streak artifact related to the patient's dental hardware. Enlarged right thyroid lobe. The submandibular glands and the parotid glands are grossly preserved.   CHEST: No specific abnormal radiotracer uptake above mediastinal blood pool in the axillary regions, hilum or mediastinum. There is focal uptake seen in the area of the cavitary lesion in the medial posterior left lower lobe with maximum SUV value of 4.5, mild. The spiculated lesion previously measured 2.7 x 2.7 cm and today on CT image 66 of series 6 measures 2.7 by 2.7 cm once again. Based on appearance a low-grade malignant lesion is in the differential. No other areas of abnormal uptake along the lung parenchyma.   Incidental CT findings: No pleural effusion or pneumothorax. Tiny nodule in the middle lobe measures 3 mm on image 60 of the CT scan. No pericardial effusion. Grossly normal caliber thoracic aorta with some mild vascular calcifications.   ABDOMEN/PELVIS: There is physiologic distribution radiotracer along the parenchymal organs, bowel and renal collecting systems.   Incidental CT findings: Stone in gallbladder. Small right-sided benign appendix cystic focus suggested. No obstructing renal stones. Upper pole right-sided benign-appearing renal cystic focus also seen. This has Hounsfield unit of 8 on series 6, image 84. No renal or ureteral stones suggested. Uterus is present. The visualized bowel is nondilated with diffuse colonic stool. Normal appendix in the right lower quadrant posterior to the  cecum. Grossly the spleen, pancreas and liver are preserved.   SKELETON: No specific abnormal radiotracer uptake along the visualized skeleton.   IMPRESSION: Cavitary spiculated left lower lobe lung nodule has low level abnormal radiotracer uptake. Based on the appearance as well as the low level abnormal uptake, a malignant lesion is in the differential.   No additional lung or areas of nodal abnormal uptake.   Asymmetrically enlarged right thyroid lobe with diffuse abnormal radiotracer uptake. An aggressive thyroid lesion is possible. Please correlate for known history or dedicated thyroid ultrasound as the next step in the workup when able.     Electronically Signed   By: Adrianna Horde M.D.   On: 02/04/2024 16:24 I personally reviewed the CT and PET/CT images.  There is a 2.6 cm nodule in the medial basilar left lower lobe that is hypermetabolic with SUV of 4.5.  No mediastinal or hilar adenopathy.  Other tiny lung nodules.  Thyroid as noted above.  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  She is a lifelong non-smoker.  Non-small cell carcinoma left lower lobe-incidentally noted on a CT coronary calcium screening.  Hypermetabolic on PET.  No evidence of regional or distant metastatic disease.  Biopsy-proven non-small cell carcinoma.  Clinical stage Ia (T1, N0).  We discussed the treatment options including surgical resection and radiation.  She understands that resection is the gold standard for treatment and provides the best chance of a cure.  I recommended that we proceed with a  robotic assisted left lower lobectomy.  I informed her of the general nature of the procedure including the need for general anesthesia, the incisions to be used, the use of the surgical robot, the use of a drainage tube postoperatively, the expected hospital stay, and the overall recovery.  I informed her of the indications, risks, benefits, and alternatives.  She understands the risks include, but are not limited to death, MI, DVT, PE, bleeding, possible need for transfusion, infection, prolonged air leak, cardiac arrhythmias, as well as possibility of other unforeseeable complications.  She understands degree of pain is variable and unpredictable.  She accepts the risk and wishes to proceed but wants to wait about 3 weeks before surgery.  Plan: Robotic assisted left lower lobectomy on Friday, 04/02/2024  Zelphia Higashi, MD [facility] [phone]

## 2024-03-09 NOTE — Progress Notes (Signed)
 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  She is a lifelong non-smoker.  She recently had a CT for coronary calcium scoring because of a strong family history of CAD.  Her coronary Pierce was 0.  However she was noted to have a 2.7 cm left lower lobe lung nodule.  She had a PET/CT which showed the nodule is hypermetabolic.  There was no evidence of distant disease.  She underwent robotic bronchoscopy which showed non-small cell carcinoma.  She denies chest pain, pressure, tightness, or shortness of breath.  No cough or wheezing.  No change in appetite or weight loss.  Amanda Pierce: At the time of surgery this patient's most appropriate activity status/level should be described as: [x]     0    Normal activity, no symptoms []     1    Restricted in physical strenuous activity but ambulatory, able to do out light work []     2    Ambulatory and capable of self care, unable to do work activities, up and about >50 % of waking hours                              []     3    Only limited self care, in bed greater than 50% of waking hours []     4    Completely disabled, no self care, confined to bed or chair []     5    Moribund   Past Medical History:  Diagnosis Date   Allergy    Anemia    Asthma    GERD (gastroesophageal reflux disease)    History of hiatal hernia    Hypothyroidism    Iron  deficiency anemia     Left lower lobe pulmonary nodule 12/2023   Osteopenia    Pre-diabetes    Pure hypercholesterolemia    Suspected sleep apnea    Thyroid disease    hypothyroid    Past Surgical History:  Procedure Laterality Date   BREAST MASS EXCISION Left 1974   benign   BRONCHOSCOPY, WITH BIOPSY USING ELECTROMAGNETIC NAVIGATION Left 02/02/2024   Procedure: BRONCHOSCOPY, WITH BIOPSY USING ELECTROMAGNETIC NAVIGATION;  Surgeon: Marc Senior, MD;  Location: [facility] ORS;  Service: Pulmonary;  Laterality: Left;   COLONOSCOPY WITH PROPOFOL  N/A 10/28/2019   Procedure: COLONOSCOPY WITH PROPOFOL ;  Surgeon: Irby Mannan, MD;  Location: [facility] ENDOSCOPY;  Service: Endoscopy;  Laterality: N/A;   ENDOSCOPIC MUCOSAL RESECTION N/A 11/22/2019   Procedure: ENDOSCOPIC MUCOSAL RESECTION;  Surgeon: Brice Campi Albino Alu., MD;  Location: [facility] ENDOSCOPY;  Service: Gastroenterology;  Laterality: N/A;   ESOPHAGOGASTRODUODENOSCOPY (EGD) WITH PROPOFOL  N/A 10/28/2019   Procedure: ESOPHAGOGASTRODUODENOSCOPY (EGD) WITH PROPOFOL ;  Surgeon: Irby Mannan, MD;  Location: [facility] ENDOSCOPY;  Service: Endoscopy;  Laterality: N/A;   ESOPHAGOGASTRODUODENOSCOPY (EGD) WITH PROPOFOL  N/A 11/22/2019   Procedure: ESOPHAGOGASTRODUODENOSCOPY (EGD) WITH PROPOFOL ;  Surgeon: Brice Campi Albino Alu., MD;  Location: [facility]  ENDOSCOPY;  Service: Gastroenterology;  Laterality: N/A;   HEMOSTASIS CLIP PLACEMENT  11/22/2019   Procedure: HEMOSTASIS CLIP PLACEMENT;  Surgeon: Normie Becton., MD;  Location: [facility] ENDOSCOPY;  Service: Gastroenterology;;   HEMOSTASIS CONTROL  11/22/2019   Procedure: HEMOSTASIS CONTROL;  Surgeon: Normie Becton., MD;  Location: [facility] ENDOSCOPY;  Service: Gastroenterology;;  epi    SUBMUCOSAL LIFTING INJECTION  11/22/2019   Procedure: SUBMUCOSAL LIFTING INJECTION;  Surgeon: Normie Becton., MD;  Location: [facility] ENDOSCOPY;  Service: Gastroenterology;;   THYROIDECTOMY, PARTIAL Left 1982   TUBAL LIGATION      VIDEO BRONCHOSCOPY WITH ENDOBRONCHIAL ULTRASOUND Left 02/02/2024   Procedure: BRONCHOSCOPY, WITH EBUS;  Surgeon: Marc Senior, MD;  Location: [facility] ORS;  Service: Pulmonary;  Laterality: Left;   WISDOM TOOTH EXTRACTION      Family History  Problem Relation Age of Onset   Cancer Mother        Breast, Paget's removed nipple 63, breast 2001   Colon polyps Mother    Paget's disease of bone Mother    Hypertension Mother    Hyperlipidemia Mother    Heart disease Father    Heart attack Father    Heart disease Maternal Grandfather    Stroke Paternal Grandmother    Colon cancer Neg Hx    Esophageal cancer Neg Hx    Inflammatory bowel disease Neg Hx    Liver disease Neg Hx    Pancreatic cancer Neg Hx    Rectal cancer Neg Hx    Stomach cancer Neg Hx     Social History Social History   Tobacco Use   Smoking status: Never   Smokeless tobacco: Never  Vaping Use   Vaping status: Never Used  Substance Use Topics   Alcohol use: Never   Drug use: Never    Current Outpatient Medications  Medication Sig Dispense Refill   Cholecalciferol (VITAMIN D3) 125 MCG (5000 UT) CAPS Take 1 capsule (5,000 Units total) by mouth daily. For 8 weeks, then start Vitamin D3 2,000 units daily (OTC) (Patient taking differently: Take 5,000 Units by mouth once a week. For 8 weeks on Saturday then start Vitamin D3 2,000 units daily (OTC)) 90 capsule    fexofenadine (ALLEGRA) 180 MG tablet Take 180 mg by mouth daily as needed for allergies or rhinitis.     fluticasone  (FLONASE ) 50 MCG/ACT nasal spray PLACE 2 SPRAYS INTO BOTH NOSTRILS DAILY. USE FOR 4-6 WEEKS THEN STOP AND USE SEASONALLY OR AS NEEDED (Patient taking differently: Place 2 sprays into both nostrils daily as needed. Use for 4-6 weeks then stop and use seasonally or as needed.) 48 mL 0   ipratropium (ATROVENT ) 0.06 % nasal spray Place 2 sprays into both nostrils 4 (four) times daily. (Patient taking differently: Place 2 sprays into both nostrils 4  (four) times daily as needed.) 15 mL 12   montelukast  (SINGULAIR ) 10 MG tablet TAKE 1 TABLET BY MOUTH EVERYDAY AT BEDTIME 30 tablet 0   nystatin cream (MYCOSTATIN) Apply 1 application  topically as needed.     phenazopyridine  (PYRIDIUM ) 200 MG tablet Take 1 tablet (200 mg total) by mouth 3 (three) times daily. (Patient taking differently: Take 200 mg by mouth 3 (three) times daily as needed.) 6 tablet 0   SYNTHROID  125 MCG tablet Take 125 mcg by mouth daily.     No current facility-administered medications for this visit.    Allergies  Allergen Reactions   Aspirin Anaphylaxis   Contrast Media [Iodinated Contrast Media] Nausea And  Vomiting and Palpitations   Elemental Sulfur Hives and Rash   Ibuprofen Anaphylaxis   Latex Rash    Any latex adhesive, rubber compounds:  examples: ekg leads,etc.   Penicillins Anaphylaxis    Did it involve swelling of the face/tongue/throat, SOB, or low BP? Yes Did it involve sudden or severe rash/hives, skin peeling, or any reaction on the inside of your mouth or nose? No Did you need to seek medical attention at a hospital or doctor's office? Yes When did it last happen?      40 years ago/ Patient was in the hospital when happend If all above answers are "NO", may proceed with cephalosporin use.   Rubbing Alcohol [Alcohol] Other (See Comments)    Severe skin warmth   Adhesive [Tape]    Medical Adhesive Remover     Other reaction(s): Not available   Sulfa Antibiotics     Other reaction(s): Not available    Review of Systems  Constitutional:  Negative for activity change, appetite change and unexpected weight change.       Decreased energy  HENT:  Negative for trouble swallowing and voice change.   Respiratory:  Negative for cough, shortness of breath and wheezing.   Cardiovascular:  Negative for chest pain and leg swelling.  Gastrointestinal:  Negative for abdominal distention.  Musculoskeletal:  Negative for arthralgias.  Skin:        Itching   Hematological:  Does not bruise/bleed easily.  Psychiatric/Behavioral:         Unusual stress  All other systems reviewed and are negative.   BP (!) 147/84 (BP Location: Right Arm, Patient Position: Sitting, Cuff Size: Normal)   Pulse 62   Resp 18   Ht 5\' 8"  (1.727 m)   Wt 217 lb 6.4 oz (98.6 kg)   LMP  (LMP Unknown)   SpO2 98% Comment: RA  BMI 33.06 kg/m  Physical Exam Vitals reviewed.  Constitutional:      Appearance: She is obese.  HENT:     Head: Normocephalic and atraumatic.  Eyes:     General: No scleral icterus.    Extraocular Movements: Extraocular movements intact.  Cardiovascular:     Rate and Rhythm: Normal rate and regular rhythm.     Heart sounds: Normal heart sounds. No murmur heard.    No friction rub. No gallop.  Pulmonary:     Effort: Pulmonary effort is normal. No respiratory distress.     Breath sounds: Normal breath sounds. No wheezing or rales.  Abdominal:     General: There is no distension.     Palpations: Abdomen is soft.  Musculoskeletal:     Cervical back: Neck supple.  Lymphadenopathy:     Cervical: No cervical adenopathy.  Skin:    General: Skin is warm and dry.  Neurological:     General: No focal deficit present.     Mental Status: She is alert and oriented to person, place, and time.     Cranial Nerves: No cranial nerve deficit.     Motor: No weakness.    Diagnostic Tests: NUCLEAR MEDICINE PET SKULL BASE TO THIGH   TECHNIQUE: 11.52 mCi F-18 FDG was injected intravenously. Full-ring PET imaging was performed from the skull base to thigh after the radiotracer. CT data was obtained and used for attenuation correction and anatomic localization.   Fasting blood glucose: 112 mg/dl   COMPARISON:  Cardiac CT 01/02/2024   FINDINGS: Mediastinal blood pool activity: SUV max 2.8   Liver activity:  SUV max 3.1   NECK: There is diffuse uptake involving the right side of the thyroid gland with maximum SUV approaching 9.0. An  aggressive or malignant process is possible. Recommend further workup such as thyroid ultrasound when appropriate. Otherwise no specific abnormal radiotracer uptake seen along lymph node change of the submandibular, posterior triangle or internal jugular regions. The visualized intracranial compartment has near symmetric uptake.   Incidental CT findings: Visualized paranasal sinuses and mastoid air cells are clear. Streak artifact related to the patient's dental hardware. Enlarged right thyroid lobe. The submandibular glands and the parotid glands are grossly preserved.   CHEST: No specific abnormal radiotracer uptake above mediastinal blood pool in the axillary regions, hilum or mediastinum. There is focal uptake seen in the area of the cavitary lesion in the medial posterior left lower lobe with maximum SUV value of 4.5, mild. The spiculated lesion previously measured 2.7 x 2.7 cm and today on CT image 66 of series 6 measures 2.7 by 2.7 cm once again. Based on appearance a low-grade malignant lesion is in the differential. No other areas of abnormal uptake along the lung parenchyma.   Incidental CT findings: No pleural effusion or pneumothorax. Tiny nodule in the middle lobe measures 3 mm on image 60 of the CT scan. No pericardial effusion. Grossly normal caliber thoracic aorta with some mild vascular calcifications.   ABDOMEN/PELVIS: There is physiologic distribution radiotracer along the parenchymal organs, bowel and renal collecting systems.   Incidental CT findings: Stone in gallbladder. Small right-sided benign appendix cystic focus suggested. No obstructing renal stones. Upper pole right-sided benign-appearing renal cystic focus also seen. This has Hounsfield unit of 8 on series 6, image 84. No renal or ureteral stones suggested. Uterus is present. The visualized bowel is nondilated with diffuse colonic stool. Normal appendix in the right lower quadrant posterior to the  cecum. Grossly the spleen, pancreas and liver are preserved.   SKELETON: No specific abnormal radiotracer uptake along the visualized skeleton.   IMPRESSION: Cavitary spiculated left lower lobe lung nodule has low level abnormal radiotracer uptake. Based on the appearance as well as the low level abnormal uptake, a malignant lesion is in the differential.   No additional lung or areas of nodal abnormal uptake.   Asymmetrically enlarged right thyroid lobe with diffuse abnormal radiotracer uptake. An aggressive thyroid lesion is possible. Please correlate for known history or dedicated thyroid ultrasound as the next step in the workup when able.     Electronically Signed   By: Adrianna Horde M.D.   On: 02/04/2024 16:24 I personally reviewed the CT and PET/CT images.  There is a 2.6 cm nodule in the medial basilar left lower lobe that is hypermetabolic with SUV of 4.5.  No mediastinal or hilar adenopathy.  Other tiny lung nodules.  Thyroid as noted above.  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  She is a lifelong non-smoker.  Non-small cell carcinoma left lower lobe-incidentally noted on a CT coronary calcium screening.  Hypermetabolic on PET.  No evidence of regional or distant metastatic disease.  Biopsy-proven non-small cell carcinoma.  Clinical stage Ia (T1, N0).  We discussed the treatment options including surgical resection and radiation.  She understands that resection is the gold standard for treatment and provides the best chance of a cure.  I recommended that we proceed with a  robotic assisted left lower lobectomy.  I informed her of the general nature of the procedure including the need for general anesthesia, the incisions to be used, the use of the surgical robot, the use of a drainage tube postoperatively, the expected hospital stay, and the overall recovery.  I informed her of the indications, risks, benefits, and alternatives.  She understands the risks include, but are not limited to death, MI, DVT, PE, bleeding, possible need for transfusion, infection, prolonged air leak, cardiac arrhythmias, as well as possibility of other unforeseeable complications.  She understands degree of pain is variable and unpredictable.  She accepts the risk and wishes to proceed but wants to wait about 3 weeks before surgery.  Plan: Robotic assisted left lower lobectomy on Friday, 04/02/2024  Amanda Higashi, MD [facility] [phone]

## 2024-03-30 NOTE — Pre-Procedure Instructions (Signed)
 Surgical Instructions   Your procedure is scheduled on April 02, 2024. Report to [facility] Main Entrance "A" at 5:30 A.M., then check in with the Admitting office. Any questions or running late day of surgery: call [phone]  Questions prior to your surgery date: call [phone], Monday-Friday, 8am-4pm. If you experience any cold or flu symptoms such as cough, fever, chills, shortness of breath, etc. between now and your scheduled surgery, please notify us  at the above number.     Remember:  Do not eat or drink after midnight the night before your surgery    Take these medicines the morning of surgery with A SIP OF WATER: SYNTHROID     May take these medicines IF NEEDED: acetaminophen (TYLENOL)  fexofenadine (ALLEGRA)  fluticasone  (FLONASE ) nasal spray  montelukast  (SINGULAIR )    One week prior to surgery, STOP taking any Aspirin (unless otherwise instructed by your surgeon) Aleve, Naproxen, Ibuprofen, Motrin, Advil, Goody's, BC's, all herbal medications, fish oil, and non-prescription vitamins.                     Do NOT Smoke (Tobacco/Vaping) for 24 hours prior to your procedure.  If you use a CPAP at night, you may bring your mask/headgear for your overnight stay.   You will be asked to remove any contacts, glasses, piercing's, hearing aid's, dentures/partials prior to surgery. Please bring cases for these items if needed.    Patients discharged the day of surgery will not be allowed to drive home, and someone needs to stay with them for 24 hours.  SURGICAL WAITING ROOM VISITATION Patients may have no more than 2 support people in the waiting area - these visitors may rotate.   Pre-op nurse will coordinate an appropriate time for 1 ADULT support person, who may not rotate, to accompany patient in pre-op.  Children under the age of 9 must have an adult with them who is not the patient and must remain in the main waiting area with an adult.  If the patient needs to stay  at the hospital during part of their recovery, the visitor guidelines for inpatient rooms apply.  Please refer to the [facility] website for the visitor guidelines for any additional information.   If you received a COVID test during your pre-op visit  it is requested that you wear a mask when out in public, stay away from anyone that may not be feeling well and notify your surgeon if you develop symptoms. If you have been in contact with anyone that has tested positive in the last 10 days please notify you surgeon.      Pre-operative CHG Bathing Instructions   You can play a key role in reducing the risk of infection after surgery. Your skin needs to be as free of germs as possible. You can reduce the number of germs on your skin by washing with CHG (chlorhexidine  gluconate) soap before surgery. CHG is an antiseptic soap that kills germs and continues to kill germs even after washing.   DO NOT use if you have an allergy to chlorhexidine /CHG or antibacterial soaps. If your skin becomes reddened or irritated, stop using the CHG and notify one of our RNs at [phone].              TAKE A SHOWER THE NIGHT BEFORE SURGERY AND THE DAY OF SURGERY    Please keep in mind the following:  DO NOT shave, including legs and underarms, 48 hours prior to surgery.  You may shave your face before/day of surgery.  Place clean sheets on your bed the night before surgery Use a clean washcloth (not used since being washed) for each shower. DO NOT sleep with pet's night before surgery.  CHG Shower Instructions:  Wash your face and private area with normal soap. If you choose to wash your hair, wash first with your normal shampoo.  After you use shampoo/soap, rinse your hair and body thoroughly to remove shampoo/soap residue.  Turn the water OFF and apply half the bottle of CHG soap to a CLEAN washcloth.  Apply CHG soap ONLY FROM YOUR NECK DOWN TO YOUR TOES (washing for 3-5 minutes)  DO NOT use CHG soap  on face, private areas, open wounds, or sores.  Pay special attention to the area where your surgery is being performed.  If you are having back surgery, having someone wash your back for you may be helpful. Wait 2 minutes after CHG soap is applied, then you may rinse off the CHG soap.  Pat dry with a clean towel  Put on clean pajamas    Additional instructions for the day of surgery: DO NOT APPLY any lotions, deodorants, cologne, or perfumes.   Do not wear jewelry or makeup Do not wear nail polish, gel polish, artificial nails, or any other type of covering on natural nails (fingers and toes) Do not bring valuables to the hospital. [facility] is not responsible for valuables/personal belongings. Put on clean/comfortable clothes.  Please brush your teeth.  Ask your nurse before applying any prescription medications to the skin.

## 2024-03-31 ENCOUNTER — Encounter (HOSPITAL_COMMUNITY)
Admission: RE | Admit: 2024-03-31 | Discharge: 2024-03-31 | Disposition: A | Discharge status: Home / Self Care | Source: Ambulatory Visit | Attending: Thoracic Surgery (Cardiothoracic Vascular Surgery) | Admitting: Thoracic Surgery (Cardiothoracic Vascular Surgery)

## 2024-03-31 ENCOUNTER — Encounter (HOSPITAL_COMMUNITY): Payer: Self-pay

## 2024-03-31 ENCOUNTER — Ambulatory Visit (HOSPITAL_COMMUNITY)
Admission: RE | Admit: 2024-03-31 | Discharge: 2024-03-31 | Disposition: A | Discharge status: Home / Self Care | Source: Ambulatory Visit | Attending: Thoracic Surgery (Cardiothoracic Vascular Surgery) | Admitting: Thoracic Surgery (Cardiothoracic Vascular Surgery)

## 2024-03-31 ENCOUNTER — Other Ambulatory Visit: Payer: Self-pay

## 2024-03-31 VITALS — BP 159/93 | HR 67 | Temp 98.4°F | Resp 17 | Ht 68.0 in | Wt 214.6 lb

## 2024-03-31 DIAGNOSIS — C3492 Malignant neoplasm of unspecified part of left bronchus or lung: Secondary | ICD-10-CM

## 2024-03-31 DIAGNOSIS — C3432 Malignant neoplasm of lower lobe, left bronchus or lung: Secondary | ICD-10-CM | POA: Insufficient documentation

## 2024-03-31 DIAGNOSIS — Z01818 Encounter for other preprocedural examination: Secondary | ICD-10-CM

## 2024-03-31 HISTORY — DX: Pneumonia, unspecified organism: J18.9

## 2024-03-31 LAB — URINALYSIS, ROUTINE W REFLEX MICROSCOPIC
Bilirubin Urine: NEGATIVE
Glucose, UA: NEGATIVE mg/dL
Ketones, ur: NEGATIVE mg/dL
Leukocytes,Ua: NEGATIVE
Nitrite: NEGATIVE
Protein, ur: NEGATIVE mg/dL
Specific Gravity, Urine: 1.02 (ref 1.005–1.030)
pH: 6 (ref 5.0–8.0)

## 2024-03-31 LAB — CBC
HCT: 39.3 % (ref 36.0–46.0)
Hemoglobin: 12.5 g/dL (ref 12.0–15.0)
MCH: 27.6 pg (ref 26.0–34.0)
MCHC: 31.8 g/dL (ref 30.0–36.0)
MCV: 86.8 fL (ref 80.0–100.0)
Platelets: 248 10*3/uL (ref 150–400)
RBC: 4.53 MIL/uL (ref 3.87–5.11)
RDW: 14.6 % (ref 11.5–15.5)
WBC: 4.8 10*3/uL (ref 4.0–10.5)
nRBC: 0 % (ref 0.0–0.2)

## 2024-03-31 LAB — COMPREHENSIVE METABOLIC PANEL WITH GFR
ALT: 17 U/L (ref 0–44)
AST: 22 U/L (ref 15–41)
Albumin: 3.4 g/dL — ABNORMAL LOW (ref 3.5–5.0)
Alkaline Phosphatase: 61 U/L (ref 38–126)
Anion gap: 8 (ref 5–15)
BUN: 12 mg/dL (ref 8–23)
CO2: 26 mmol/L (ref 22–32)
Calcium: 9 mg/dL (ref 8.9–10.3)
Chloride: 105 mmol/L (ref 98–111)
Creatinine, Ser: 0.84 mg/dL (ref 0.44–1.00)
GFR, Estimated: >60 mL/min (ref >60)
Glucose, Bld: 109 mg/dL — ABNORMAL HIGH (ref 70–99)
Potassium: 4.4 mmol/L (ref 3.5–5.1)
Sodium: 139 mmol/L (ref 135–145)
Total Bilirubin: 0.8 mg/dL (ref 0.0–1.2)
Total Protein: 7.1 g/dL (ref 6.5–8.1)

## 2024-03-31 LAB — PROTIME-INR
INR: 1 (ref 0.8–1.2)
Prothrombin Time: 13.5 s (ref 11.4–15.2)

## 2024-03-31 LAB — TYPE AND SCREEN
ABO/RH(D): B POS
Antibody Screen: NEGATIVE

## 2024-03-31 LAB — URINALYSIS, MICROSCOPIC (REFLEX): Bacteria, UA: NONE SEEN

## 2024-03-31 LAB — SURGICAL PCR SCREEN
MRSA, PCR: NEGATIVE
Staphylococcus aureus: POSITIVE — AB

## 2024-03-31 LAB — APTT: aPTT: 34 s (ref 24–36)

## 2024-03-31 NOTE — Progress Notes (Signed)
 PCP - Dr. Domingo Friend Cardiologist - Denies  PPM/ICD - Denies Device Orders - n/a Rep Notified - n/a  Chest x-ray - 03/31/2024 EKG - 03/31/2024 Stress Test - Per pt, had one 20+ years ago.  ECHO - Denies Cardiac Cath - Denies  Sleep Study - Denies CPAP - n/a  Pt is Pre-DM  Last dose of GLP1 agonist- n/a GLP1 instructions: n/a  Blood Thinner Instructions: n/a Aspirin Instructions: n/a  NPO after midnight  COVID TEST- n/a   Anesthesia review: Yes. Borderline EKG review. Just to note, after bronchoscopy in March pt was told she had suspected OSA. No plans currently for sleep study.  Patient denies shortness of breath, fever, cough and chest pain at PAT appointment. Pt denies any respiratory illness/infection in the last two months.   All instructions explained to the patient, with a verbal understanding of the material. Patient agrees to go over the instructions while at home for a better understanding. Patient also instructed to self quarantine after being tested for COVID-19. The opportunity to ask questions was provided.

## 2024-04-02 ENCOUNTER — Inpatient Hospital Stay (HOSPITAL_COMMUNITY): Payer: Self-pay | Admitting: Certified Registered"

## 2024-04-02 ENCOUNTER — Other Ambulatory Visit: Payer: Self-pay

## 2024-04-02 ENCOUNTER — Inpatient Hospital Stay (HOSPITAL_COMMUNITY)

## 2024-04-02 ENCOUNTER — Encounter (HOSPITAL_COMMUNITY): Payer: Self-pay | Admitting: Thoracic Surgery (Cardiothoracic Vascular Surgery)

## 2024-04-02 ENCOUNTER — Inpatient Hospital Stay (HOSPITAL_COMMUNITY)
Admission: RE | Admit: 2024-04-02 | Discharge: 2024-04-05 | DRG: 164 | Disposition: A | Discharge status: Home / Self Care | Attending: Thoracic Surgery (Cardiothoracic Vascular Surgery) | Admitting: Thoracic Surgery (Cardiothoracic Vascular Surgery)

## 2024-04-02 ENCOUNTER — Encounter (HOSPITAL_COMMUNITY)
Admission: RE | Disposition: A | Discharge status: Home / Self Care | Payer: Self-pay | Source: Home / Self Care | Attending: Thoracic Surgery (Cardiothoracic Vascular Surgery)

## 2024-04-02 ENCOUNTER — Inpatient Hospital Stay (HOSPITAL_COMMUNITY): Payer: Self-pay | Admitting: Physician Assistant

## 2024-04-02 DIAGNOSIS — Z83719 Family history of colon polyps, unspecified: Secondary | ICD-10-CM

## 2024-04-02 DIAGNOSIS — Z823 Family history of stroke: Secondary | ICD-10-CM | POA: Diagnosis not present

## 2024-04-02 DIAGNOSIS — Z91040 Latex allergy status: Secondary | ICD-10-CM

## 2024-04-02 DIAGNOSIS — Z91041 Radiographic dye allergy status: Secondary | ICD-10-CM | POA: Diagnosis not present

## 2024-04-02 DIAGNOSIS — E7800 Pure hypercholesterolemia, unspecified: Secondary | ICD-10-CM | POA: Diagnosis present

## 2024-04-02 DIAGNOSIS — K219 Gastro-esophageal reflux disease without esophagitis: Secondary | ICD-10-CM | POA: Diagnosis present

## 2024-04-02 DIAGNOSIS — C3432 Malignant neoplasm of lower lobe, left bronchus or lung: Principal | ICD-10-CM | POA: Diagnosis present

## 2024-04-02 DIAGNOSIS — Z888 Allergy status to other drugs, medicaments and biological substances status: Secondary | ICD-10-CM | POA: Diagnosis not present

## 2024-04-02 DIAGNOSIS — J95811 Postprocedural pneumothorax: Secondary | ICD-10-CM | POA: Diagnosis not present

## 2024-04-02 DIAGNOSIS — Z6833 Body mass index (BMI) 33.0-33.9, adult: Secondary | ICD-10-CM | POA: Diagnosis not present

## 2024-04-02 DIAGNOSIS — Z79890 Hormone replacement therapy (postmenopausal): Secondary | ICD-10-CM

## 2024-04-02 DIAGNOSIS — Z01818 Encounter for other preprocedural examination: Secondary | ICD-10-CM

## 2024-04-02 DIAGNOSIS — Z79899 Other long term (current) drug therapy: Secondary | ICD-10-CM | POA: Diagnosis not present

## 2024-04-02 DIAGNOSIS — R001 Bradycardia, unspecified: Secondary | ICD-10-CM | POA: Diagnosis present

## 2024-04-02 DIAGNOSIS — E669 Obesity, unspecified: Secondary | ICD-10-CM | POA: Diagnosis present

## 2024-04-02 DIAGNOSIS — E039 Hypothyroidism, unspecified: Secondary | ICD-10-CM

## 2024-04-02 DIAGNOSIS — R7303 Prediabetes: Secondary | ICD-10-CM | POA: Diagnosis present

## 2024-04-02 DIAGNOSIS — M8588 Other specified disorders of bone density and structure, other site: Secondary | ICD-10-CM | POA: Diagnosis present

## 2024-04-02 DIAGNOSIS — Z83438 Family history of other disorder of lipoprotein metabolism and other lipidemia: Secondary | ICD-10-CM

## 2024-04-02 DIAGNOSIS — Z882 Allergy status to sulfonamides status: Secondary | ICD-10-CM

## 2024-04-02 DIAGNOSIS — C3492 Malignant neoplasm of unspecified part of left bronchus or lung: Secondary | ICD-10-CM

## 2024-04-02 DIAGNOSIS — Y838 Other surgical procedures as the cause of abnormal reaction of the patient, or of later complication, without mention of misadventure at the time of the procedure: Secondary | ICD-10-CM | POA: Diagnosis not present

## 2024-04-02 DIAGNOSIS — Z8249 Family history of ischemic heart disease and other diseases of the circulatory system: Secondary | ICD-10-CM

## 2024-04-02 DIAGNOSIS — J45909 Unspecified asthma, uncomplicated: Secondary | ICD-10-CM | POA: Diagnosis present

## 2024-04-02 DIAGNOSIS — E063 Autoimmune thyroiditis: Secondary | ICD-10-CM | POA: Diagnosis present

## 2024-04-02 DIAGNOSIS — D62 Acute posthemorrhagic anemia: Secondary | ICD-10-CM | POA: Diagnosis present

## 2024-04-02 DIAGNOSIS — Z902 Acquired absence of lung [part of]: Principal | ICD-10-CM

## 2024-04-02 HISTORY — PX: SENTINEL NODE BIOPSY: SHX6608

## 2024-04-02 HISTORY — PX: LOBECTOMY, LUNG, ROBOT-ASSISTED, USING VATS: SHX7607

## 2024-04-02 HISTORY — PX: INTERCOSTAL NERVE BLOCK: SHX5021

## 2024-04-02 LAB — ABO/RH: ABO/RH(D): B POS

## 2024-04-02 SURGERY — LOBECTOMY, LUNG, ROBOT-ASSISTED, USING VATS
Anesthesia: General | Site: Chest | Laterality: Left

## 2024-04-02 MED ORDER — CHLORHEXIDINE GLUCONATE CLOTH 2 % EX PADS
6.0000 | MEDICATED_PAD | Freq: Every day | CUTANEOUS | Status: DC
  Administered 2024-04-03 – 2024-04-05 (×3): 6 via TOPICAL

## 2024-04-02 MED ORDER — SENNOSIDES-DOCUSATE SODIUM 8.6-50 MG PO TABS
1.0000 | ORAL_TABLET | Freq: Every day | ORAL | Status: DC
  Administered 2024-04-02 – 2024-04-04 (×2): 1 via ORAL
  Filled 2024-04-02 (×2): qty 1

## 2024-04-02 MED ORDER — MONTELUKAST SODIUM 10 MG PO TABS
10.0000 mg | ORAL_TABLET | Freq: Every day | ORAL | Status: DC | PRN
  Administered 2024-04-05: 10 mg via ORAL
  Filled 2024-04-02: qty 1

## 2024-04-02 MED ORDER — LIDOCAINE 2% (20 MG/ML) 5 ML SYRINGE
INTRAMUSCULAR | Status: DC | PRN
  Administered 2024-04-02: 40 mg via INTRAVENOUS

## 2024-04-02 MED ORDER — IPRATROPIUM-ALBUTEROL 0.5-2.5 (3) MG/3ML IN SOLN
3.0000 mL | Freq: Once | RESPIRATORY_TRACT | Status: AC
  Administered 2024-04-02: 3 mL via RESPIRATORY_TRACT

## 2024-04-02 MED ORDER — PANTOPRAZOLE SODIUM 40 MG PO TBEC
40.0000 mg | DELAYED_RELEASE_TABLET | Freq: Every day | ORAL | Status: DC
  Administered 2024-04-03 – 2024-04-05 (×3): 40 mg via ORAL
  Filled 2024-04-02 (×3): qty 1

## 2024-04-02 MED ORDER — PHENYLEPHRINE HCL-NACL 20-0.9 MG/250ML-% IV SOLN
INTRAVENOUS | Status: DC | PRN
  Administered 2024-04-02: 25 ug/min via INTRAVENOUS

## 2024-04-02 MED ORDER — CHLORHEXIDINE GLUCONATE 0.12 % MT SOLN
OROMUCOSAL | Status: AC
  Filled 2024-04-02: qty 15

## 2024-04-02 MED ORDER — SODIUM CHLORIDE 0.9% IV SOLUTION
INTRAVENOUS | Status: AC | PRN
  Administered 2024-04-02: 1000 mL via INTRAMUSCULAR

## 2024-04-02 MED ORDER — ACETAMINOPHEN 500 MG PO TABS
1000.0000 mg | ORAL_TABLET | Freq: Four times a day (QID) | ORAL | Status: DC
  Administered 2024-04-02 – 2024-04-05 (×10): 1000 mg via ORAL
  Filled 2024-04-02 (×11): qty 2

## 2024-04-02 MED ORDER — MORPHINE SULFATE (PF) 2 MG/ML IV SOLN
2.0000 mg | INTRAVENOUS | Status: DC | PRN
  Administered 2024-04-04: 2 mg via INTRAVENOUS
  Filled 2024-04-02: qty 1

## 2024-04-02 MED ORDER — PROPOFOL 10 MG/ML IV BOLUS
INTRAVENOUS | Status: AC
  Filled 2024-04-02: qty 20

## 2024-04-02 MED ORDER — SODIUM CHLORIDE 0.9% FLUSH
10.0000 mL | Freq: Two times a day (BID) | INTRAVENOUS | Status: DC
  Administered 2024-04-02 – 2024-04-05 (×6): 10 mL

## 2024-04-02 MED ORDER — SODIUM CHLORIDE 0.9% FLUSH: 10.0000 mL | INTRAVENOUS | Status: DC | PRN

## 2024-04-02 MED ORDER — PHENYLEPHRINE 80 MCG/ML (10ML) SYRINGE FOR IV PUSH (FOR BLOOD PRESSURE SUPPORT)
PREFILLED_SYRINGE | INTRAVENOUS | Status: AC
  Filled 2024-04-02: qty 10

## 2024-04-02 MED ORDER — ROCURONIUM BROMIDE 10 MG/ML (PF) SYRINGE
PREFILLED_SYRINGE | INTRAVENOUS | Status: AC
  Filled 2024-04-02: qty 10

## 2024-04-02 MED ORDER — GABAPENTIN 300 MG PO CAPS
300.0000 mg | ORAL_CAPSULE | Freq: Every day | ORAL | Status: AC
  Administered 2024-04-02 – 2024-04-04 (×3): 300 mg via ORAL
  Filled 2024-04-02 (×3): qty 1

## 2024-04-02 MED ORDER — KETOROLAC TROMETHAMINE 30 MG/ML IJ SOLN
INTRAMUSCULAR | Status: DC | PRN
  Administered 2024-04-02: 15 mg via INTRAVENOUS

## 2024-04-02 MED ORDER — VANCOMYCIN HCL IN DEXTROSE 1-5 GM/200ML-% IV SOLN
1000.0000 mg | Freq: Two times a day (BID) | INTRAVENOUS | Status: AC
  Administered 2024-04-02: 1000 mg via INTRAVENOUS
  Filled 2024-04-02: qty 200

## 2024-04-02 MED ORDER — IPRATROPIUM-ALBUTEROL 0.5-2.5 (3) MG/3ML IN SOLN
RESPIRATORY_TRACT | Status: AC
  Filled 2024-04-02: qty 3

## 2024-04-02 MED ORDER — GABAPENTIN 300 MG PO CAPS: 300.0000 mg | ORAL_CAPSULE | Freq: Two times a day (BID) | ORAL | Status: DC

## 2024-04-02 MED ORDER — BUPIVACAINE LIPOSOME 1.3 % IJ SUSP
INTRAMUSCULAR | Status: DC | PRN
  Administered 2024-04-02: 100 mL

## 2024-04-02 MED ORDER — SODIUM CHLORIDE 0.45 % IV SOLN: INTRAVENOUS | Status: AC

## 2024-04-02 MED ORDER — DEXAMETHASONE SODIUM PHOSPHATE 10 MG/ML IJ SOLN
INTRAMUSCULAR | Status: AC
Start: 2024-04-02 — End: ?
  Filled 2024-04-02: qty 1

## 2024-04-02 MED ORDER — BUPIVACAINE HCL (PF) 0.5 % IJ SOLN
INTRAMUSCULAR | Status: AC
  Filled 2024-04-02: qty 30

## 2024-04-02 MED ORDER — GLYCOPYRROLATE 0.2 MG/ML IJ SOLN
INTRAMUSCULAR | Status: DC | PRN
Start: 2024-04-02 — End: 2024-04-02
  Administered 2024-04-02: .2 mg via INTRAVENOUS

## 2024-04-02 MED ORDER — BUPIVACAINE LIPOSOME 1.3 % IJ SUSP
INTRAMUSCULAR | Status: AC
Start: 2024-04-02 — End: ?
  Filled 2024-04-02: qty 20

## 2024-04-02 MED ORDER — FENTANYL CITRATE (PF) 100 MCG/2ML IJ SOLN
INTRAMUSCULAR | Status: AC
  Filled 2024-04-02: qty 2

## 2024-04-02 MED ORDER — ALBUMIN HUMAN 5 % IV SOLN: INTRAVENOUS | Status: DC | PRN

## 2024-04-02 MED ORDER — PROPOFOL 10 MG/ML IV BOLUS
INTRAVENOUS | Status: DC | PRN
  Administered 2024-04-02: 130 mg via INTRAVENOUS

## 2024-04-02 MED ORDER — MIDAZOLAM HCL 2 MG/2ML IJ SOLN
INTRAMUSCULAR | Status: DC | PRN
  Administered 2024-04-02: 1 mg via INTRAVENOUS

## 2024-04-02 MED ORDER — KETOROLAC TROMETHAMINE 30 MG/ML IJ SOLN
INTRAMUSCULAR | Status: AC
  Filled 2024-04-02: qty 1

## 2024-04-02 MED ORDER — LEVOTHYROXINE SODIUM 125 MCG PO TABS
125.0000 ug | ORAL_TABLET | Freq: Every day | ORAL | Status: DC
  Administered 2024-04-03 – 2024-04-05 (×3): 125 ug via ORAL
  Filled 2024-04-02 (×3): qty 1

## 2024-04-02 MED ORDER — FENTANYL CITRATE (PF) 100 MCG/2ML IJ SOLN
25.0000 ug | INTRAMUSCULAR | Status: DC | PRN
  Administered 2024-04-02 (×3): 50 ug via INTRAVENOUS

## 2024-04-02 MED ORDER — OXYCODONE HCL 5 MG PO TABS
5.0000 mg | ORAL_TABLET | ORAL | Status: DC | PRN
  Administered 2024-04-02 – 2024-04-05 (×6): 10 mg via ORAL
  Filled 2024-04-02 (×7): qty 2

## 2024-04-02 MED ORDER — BISACODYL 5 MG PO TBEC
10.0000 mg | DELAYED_RELEASE_TABLET | Freq: Every day | ORAL | Status: DC
  Administered 2024-04-03 – 2024-04-04 (×2): 10 mg via ORAL
  Filled 2024-04-02 (×3): qty 2

## 2024-04-02 MED ORDER — SUGAMMADEX SODIUM 200 MG/2ML IV SOLN
INTRAVENOUS | Status: DC | PRN
  Administered 2024-04-02: 200 mg via INTRAVENOUS

## 2024-04-02 MED ORDER — ACETAMINOPHEN 160 MG/5ML PO SOLN: 1000.0000 mg | Freq: Four times a day (QID) | ORAL | Status: DC

## 2024-04-02 MED ORDER — FENTANYL CITRATE (PF) 250 MCG/5ML IJ SOLN
INTRAMUSCULAR | Status: AC
Start: 2024-04-02 — End: ?
  Filled 2024-04-02: qty 5

## 2024-04-02 MED ORDER — IPRATROPIUM-ALBUTEROL 0.5-2.5 (3) MG/3ML IN SOLN: 3.0000 mL | RESPIRATORY_TRACT | Status: DC

## 2024-04-02 MED ORDER — MIDAZOLAM HCL 2 MG/2ML IJ SOLN
INTRAMUSCULAR | Status: AC
  Filled 2024-04-02: qty 2

## 2024-04-02 MED ORDER — ENOXAPARIN SODIUM 40 MG/0.4ML IJ SOSY
40.0000 mg | PREFILLED_SYRINGE | Freq: Every day | INTRAMUSCULAR | Status: DC
  Administered 2024-04-02 – 2024-04-05 (×4): 40 mg via SUBCUTANEOUS
  Filled 2024-04-02 (×4): qty 0.4

## 2024-04-02 MED ORDER — KETOROLAC TROMETHAMINE 15 MG/ML IJ SOLN
15.0000 mg | Freq: Four times a day (QID) | INTRAMUSCULAR | Status: AC | PRN
  Filled 2024-04-02: qty 1

## 2024-04-02 MED ORDER — ONDANSETRON HCL 4 MG/2ML IJ SOLN
4.0000 mg | Freq: Four times a day (QID) | INTRAMUSCULAR | Status: DC | PRN
  Administered 2024-04-02: 4 mg via INTRAVENOUS
  Filled 2024-04-02: qty 2

## 2024-04-02 MED ORDER — FENTANYL CITRATE (PF) 100 MCG/2ML IJ SOLN
INTRAMUSCULAR | Status: AC
Start: 2024-04-02 — End: ?
  Filled 2024-04-02: qty 2

## 2024-04-02 MED ORDER — LACTATED RINGERS IV SOLN: INTRAVENOUS | Status: DC | PRN

## 2024-04-02 MED ORDER — ROCURONIUM BROMIDE 10 MG/ML (PF) SYRINGE
PREFILLED_SYRINGE | INTRAVENOUS | Status: DC | PRN
  Administered 2024-04-02: 20 mg via INTRAVENOUS
  Administered 2024-04-02: 40 mg via INTRAVENOUS
  Administered 2024-04-02: 60 mg via INTRAVENOUS

## 2024-04-02 MED ORDER — ONDANSETRON HCL 4 MG/2ML IJ SOLN
INTRAMUSCULAR | Status: DC | PRN
  Administered 2024-04-02: 4 mg via INTRAVENOUS

## 2024-04-02 MED ORDER — MUPIROCIN 2 % EX OINT
TOPICAL_OINTMENT | CUTANEOUS | Status: AC
  Administered 2024-04-02: 1 via TOPICAL
  Filled 2024-04-02: qty 22

## 2024-04-02 MED ORDER — SODIUM CHLORIDE (PF) 0.9 % IJ SOLN
INTRAMUSCULAR | Status: AC
Start: 2024-04-02 — End: ?
  Filled 2024-04-02: qty 50

## 2024-04-02 MED ORDER — EPHEDRINE 5 MG/ML INJ
INTRAVENOUS | Status: AC
  Filled 2024-04-02: qty 5

## 2024-04-02 MED ORDER — LIDOCAINE 2% (20 MG/ML) 5 ML SYRINGE
INTRAMUSCULAR | Status: AC
  Filled 2024-04-02: qty 5

## 2024-04-02 MED ORDER — VANCOMYCIN HCL IN DEXTROSE 1-5 GM/200ML-% IV SOLN
1000.0000 mg | INTRAVENOUS | Status: AC
  Administered 2024-04-02: 1000 mg via INTRAVENOUS
  Filled 2024-04-02: qty 200

## 2024-04-02 MED ORDER — DEXAMETHASONE SODIUM PHOSPHATE 10 MG/ML IJ SOLN
INTRAMUSCULAR | Status: DC | PRN
  Administered 2024-04-02: 10 mg via INTRAVENOUS

## 2024-04-02 MED ORDER — 0.9 % SODIUM CHLORIDE (POUR BTL) OPTIME
TOPICAL | Status: DC | PRN
  Administered 2024-04-02: 2000 mL

## 2024-04-02 MED ORDER — LORATADINE 10 MG PO TABS
10.0000 mg | ORAL_TABLET | Freq: Every day | ORAL | Status: DC | PRN
  Filled 2024-04-02: qty 1

## 2024-04-02 MED ORDER — ONDANSETRON HCL 4 MG/2ML IJ SOLN
INTRAMUSCULAR | Status: AC
  Filled 2024-04-02: qty 2

## 2024-04-02 MED ORDER — FENTANYL CITRATE (PF) 250 MCG/5ML IJ SOLN
INTRAMUSCULAR | Status: DC | PRN
  Administered 2024-04-02 (×5): 50 ug via INTRAVENOUS

## 2024-04-02 MED ORDER — MUPIROCIN 2 % EX OINT: 1.0000 | TOPICAL_OINTMENT | Freq: Once | CUTANEOUS | Status: AC

## 2024-04-02 SURGICAL SUPPLY — 70 items
BLADE CLIPPER SURG (BLADE) IMPLANT
CANISTER SUCTION 3000ML PPV (SUCTIONS) ×4 IMPLANT
CANNULA REDUCER 12-8 DVNC XI (CANNULA) ×4 IMPLANT
CATH FOLEY LF 16FR (CATHETERS) IMPLANT
CNTNR URN SCR LID CUP LEK RST (MISCELLANEOUS) ×10 IMPLANT
CONN ST 1/4X3/8 BEN (MISCELLANEOUS) IMPLANT
DEFOGGER SCOPE WARM SEASHARP (MISCELLANEOUS) ×2 IMPLANT
DERMABOND ADVANCED .7 DNX12 (GAUZE/BANDAGES/DRESSINGS) ×2 IMPLANT
DRAIN CHANNEL 28F RND 3/8 FF (WOUND CARE) IMPLANT
DRAPE ARM DVNC X/XI (DISPOSABLE) ×8 IMPLANT
DRAPE COLUMN DVNC XI (DISPOSABLE) ×2 IMPLANT
DRAPE CV SPLIT W-CLR ANES SCRN (DRAPES) ×2 IMPLANT
DRAPE HALF SHEET 40X57 (DRAPES) ×2 IMPLANT
DRAPE INCISE IOBAN 66X45 STRL (DRAPES) IMPLANT
DRAPE SURG ORHT 6 SPLT 77X108 (DRAPES) ×2 IMPLANT
ELECT BLADE 6.5 EXT (BLADE) ×2 IMPLANT
ELECTRODE REM PT RTRN 9FT ADLT (ELECTROSURGICAL) ×2 IMPLANT
FORCEPS BPLR FENES DVNC XI (FORCEP) IMPLANT
FORCEPS BPLR LNG DVNC XI (INSTRUMENTS) IMPLANT
GAUZE KITTNER 4X5 RF (MISCELLANEOUS) ×4 IMPLANT
GAUZE SPONGE 4X4 12PLY STRL (GAUZE/BANDAGES/DRESSINGS) ×2 IMPLANT
GLOVE SS BIOGEL STRL SZ 7.5 (GLOVE) ×4 IMPLANT
GLOVE SURG POLYISO LF SZ8 (GLOVE) ×2 IMPLANT
GOWN STRL REUS W/ TWL LRG LVL3 (GOWN DISPOSABLE) ×4 IMPLANT
GOWN STRL REUS W/ TWL XL LVL3 (GOWN DISPOSABLE) ×4 IMPLANT
GOWN STRL REUS W/TWL 2XL LVL3 (GOWN DISPOSABLE) ×2 IMPLANT
GRASPER TIP-UP FEN DVNC XI (INSTRUMENTS) IMPLANT
HEMOSTAT SURGICEL 2X14 (HEMOSTASIS) ×6 IMPLANT
IRRIGATION STRYKERFLOW (MISCELLANEOUS) ×2 IMPLANT
KIT BASIN OR (CUSTOM PROCEDURE TRAY) ×2 IMPLANT
KIT SUCTION CATH 14FR (SUCTIONS) IMPLANT
KIT TURNOVER KIT B (KITS) ×2 IMPLANT
NDL HYPO 25GX1X1/2 BEV (NEEDLE) ×2 IMPLANT
NDL SPNL 22GX3.5 QUINCKE BK (NEEDLE) ×2 IMPLANT
NEEDLE HYPO 25GX1X1/2 BEV (NEEDLE) ×2 IMPLANT
NEEDLE SPNL 22GX3.5 QUINCKE BK (NEEDLE) ×2 IMPLANT
NS IRRIG 1000ML POUR BTL (IV SOLUTION) ×4 IMPLANT
PACK CHEST (CUSTOM PROCEDURE TRAY) ×2 IMPLANT
PAD ARMBOARD POSITIONER FOAM (MISCELLANEOUS) ×4 IMPLANT
PORT ACCESS TROCAR AIRSEAL 12 (TROCAR) ×2 IMPLANT
RELOAD STAPLE 45 2.5 WHT DVNC (STAPLE) IMPLANT
RELOAD STAPLE 45 3.5 BLU DVNC (STAPLE) IMPLANT
RELOAD STAPLE 45 4.3 GRN DVNC (STAPLE) IMPLANT
RELOAD STAPLER 2.5X45 WHT DVNC (STAPLE) ×8 IMPLANT
RELOAD STAPLER 3.5X45 BLU DVNC (STAPLE) ×6 IMPLANT
RELOAD STAPLER 4.3X45 GRN DVNC (STAPLE) ×2 IMPLANT
SCISSORS LAP 5X35 DISP (ENDOMECHANICALS) IMPLANT
SEAL UNIV 5-12 XI (MISCELLANEOUS) ×8 IMPLANT
SET TRI-LUMEN FLTR TB AIRSEAL (TUBING) ×2 IMPLANT
SOLUTION ELECTROSURG ANTI STCK (MISCELLANEOUS) ×2 IMPLANT
SPONGE INTESTINAL PEANUT (DISPOSABLE) IMPLANT
SPONGE TONSIL 1 RF SGL (DISPOSABLE) IMPLANT
STAPLER 45 SUREFORM CVD DVNC (STAPLE) IMPLANT
SUT PROLENE 4-0 RB1 .5 CRCL 36 (SUTURE) IMPLANT
SUT SILK 1 MH (SUTURE) ×2 IMPLANT
SUT SILK 2 0 SH (SUTURE) ×2 IMPLANT
SUT SILK 2 0SH CR/8 30 (SUTURE) IMPLANT
SUT SILK 3 0SH CR/8 30 (SUTURE) IMPLANT
SUT VIC AB 1 CTX36XBRD ANBCTR (SUTURE) ×2 IMPLANT
SUT VIC AB 2-0 CTX 36 (SUTURE) ×2 IMPLANT
SUT VIC AB 3-0 X1 27 (SUTURE) ×4 IMPLANT
SUT VICRYL 0 TIES 12 18 (SUTURE) ×2 IMPLANT
SUT VICRYL 0 UR6 27IN ABS (SUTURE) ×4 IMPLANT
SYR 20CC LL (SYRINGE) ×4 IMPLANT
SYSTEM RETRIEVAL ANCHOR 15 (MISCELLANEOUS) IMPLANT
SYSTEM SAHARA CHEST DRAIN ATS (WOUND CARE) ×2 IMPLANT
TAPE CLOTH 4X10 WHT NS (GAUZE/BANDAGES/DRESSINGS) ×2 IMPLANT
TOWEL GREEN STERILE (TOWEL DISPOSABLE) ×2 IMPLANT
TRAY FOLEY MTR SLVR 16FR STAT (SET/KITS/TRAYS/PACK) ×2 IMPLANT
WATER STERILE IRR 1000ML POUR (IV SOLUTION) ×4 IMPLANT

## 2024-04-02 NOTE — Brief Op Note (Addendum)
 04/02/2024  11:10 AM  PATIENT:  Amanda Pierce  68 y.o. female  PRE-OPERATIVE DIAGNOSIS:  LEFT LOWER LOBE NON-SMALL CELL CARCINOMA  POST-OPERATIVE DIAGNOSIS:  LEFT LOWER LOBE NON-SMALL CELL CARCINOMA  PROCEDURE:   ROBOTIC ASSISTED LEFT LOWER LOBECTOMY LYMPH NODE DISSECTION INTERCOSTAL BLOCK, NERVE, (Left)  SURGEON:  Surgeons and Role:    * Zelphia Higashi, MD - Primary  PHYSICIAN ASSISTANT: Debroah Fanning PA-C  ASSISTANTS: none   ANESTHESIA:   local and general  EBL:  Minimal   BLOOD ADMINISTERED:none  DRAINS: left pleural drain   LOCAL MEDICATIONS USED:  OTHER exparel  SPECIMEN:  Source of Specimen:  left lower lobe, lymph nodes  DISPOSITION OF SPECIMEN:  PATHOLOGY  COUNTS:  YES  DICTATION: .Dragon Dictation  PLAN OF CARE: Admit to inpatient   PATIENT DISPOSITION:  PACU - hemodynamically stable.   Delay start of Pharmacological VTE agent (>24hrs) due to surgical blood loss or risk of bleeding: no

## 2024-04-02 NOTE — Anesthesia Procedure Notes (Addendum)
 Procedure Name: Intubation Date/Time: 04/02/2024 8:31 AM  Performed by: Bennett Brass, CRNAPre-anesthesia Checklist: Patient identified, Emergency Drugs available, Suction available and Patient being monitored Patient Re-evaluated:Patient Re-evaluated prior to induction Oxygen Delivery Method: Circle system utilized Preoxygenation: Pre-oxygenation with 100% oxygen Induction Type: IV induction Ventilation: Mask ventilation without difficulty Laryngoscope Size: Glidescope, 3, Mac, 4, Miller and 2 Grade View: Grade II Tube type: Oral Endobronchial tube: Double lumen EBT and 37 Fr Number of attempts: 3 Airway Equipment and Method: Stylet, Oral airway, Video-laryngoscopy and Fiberoptic brochoscope Placement Confirmation: ETT inserted through vocal cords under direct vision, positive ETCO2 and breath sounds checked- equal and bilateral Tube secured with: Tape Dental Injury: Teeth and Oropharynx as per pre-operative assessment  Comments: Despite muscle relaxation, pts cords remained partially closed making it  difficult to pass double lumen tube. Attempt x 1 by [employer], CRNA. Dr. Otis Blocker attempted with MAC 4, unable to advance DLT pass the vocal cords. Glidescope 3 and placed a 7.0 single lumen tube and passed the 11 [place], 100cm Cook tube exchanger through single lumen tube and passed double lumen through cords under direct visualization. Bronchoscope then used to manipulate tube to proper position.

## 2024-04-02 NOTE — Interval H&P Note (Signed)
 History and Physical Interval Note:  04/02/2024 6:57 AM  Amanda Pierce  has presented today for surgery, with the diagnosis of LLL NON-SMALL CELL CARCINOMA.  The various methods of treatment have been discussed with the patient and family. After consideration of risks, benefits and other options for treatment, the patient has consented to  Procedure(s) with comments: LOBECTOMY, LUNG, ROBOT-ASSISTED, USING VATS (Left) - ROBOTIC LEFT LOWER LOBECTOMY as a surgical intervention.  The patient's history has been reviewed, patient examined, no change in status, stable for surgery.  I have reviewed the patient's chart and labs.  Questions were answered to the patient's satisfaction.     Amanda Pierce

## 2024-04-02 NOTE — Anesthesia Postprocedure Evaluation (Signed)
 Anesthesia Post Note  Patient: Yaelis Scharfenberg  Procedure(s) Performed: LOBECTOMY, LUNG, ROBOT-ASSISTED, USING VATS (Left: Chest) BIOPSY, LYMPH NODE (Chest) BLOCK, NERVE, INTERCOSTAL (Left: Chest)     Patient location during evaluation: PACU Anesthesia Type: General Level of consciousness: awake and alert Pain management: pain level controlled Vital Signs Assessment: post-procedure vital signs reviewed and stable Respiratory status: spontaneous breathing, nonlabored ventilation, respiratory function stable and patient connected to nasal cannula oxygen Cardiovascular status: blood pressure returned to baseline and stable Postop Assessment: no apparent nausea or vomiting Anesthetic complications: no  No notable events documented.  Last Vitals:  Vitals:   04/02/24 1315 04/02/24 1340  BP: 129/82 (!) 154/79  Pulse: (!) 56   Resp: 12   Temp: (!) 36.2 C   SpO2: 100%     Last Pain:  Vitals:   04/02/24 1340  TempSrc: Axillary  PainSc:                  Willian Harrow

## 2024-04-02 NOTE — Anesthesia Procedure Notes (Signed)
 Central Venous Catheter Insertion Performed by: Willian Harrow, MD, anesthesiologist Start/End6/03/2024 9:49 AM, 04/02/2024 9:49 AM Patient location: Pre-op. Preanesthetic checklist: patient identified, IV checked, site marked, risks and benefits discussed, surgical consent, monitors and equipment checked, pre-op evaluation, timeout performed and anesthesia consent Position: Trendelenburg Lidocaine  1% used for infiltration and patient sedated Hand hygiene performed , maximum sterile barriers used  and Seldinger technique used Catheter size: 8 Fr Total catheter length 16. Central line was placed.Double lumen Procedure performed using ultrasound guided technique. Ultrasound Notes:anatomy identified, needle tip was noted to be adjacent to the nerve/plexus identified, no ultrasound evidence of intravascular and/or intraneural injection and image(s) printed for medical record Attempts: 1 Following insertion, dressing applied, line sutured and Biopatch. Post procedure assessment: blood return through all ports  Patient tolerated the procedure well with no immediate complications.

## 2024-04-02 NOTE — Progress Notes (Addendum)
 Patient reported nausea after jello and cranberry juice. Zofran  given

## 2024-04-02 NOTE — Anesthesia Preprocedure Evaluation (Addendum)
 Anesthesia Evaluation  Patient identified by MRN, date of birth, ID band Patient awake    Reviewed: Allergy & Precautions, NPO status , Patient's Chart, lab work & pertinent test results  Airway Mallampati: III  TM Distance: >3 FB Neck ROM: Full    Dental  (+) Teeth Intact, Dental Advisory Given   Pulmonary asthma    breath sounds clear to auscultation       Cardiovascular negative cardio ROS  Rhythm:Regular Rate:Normal     Neuro/Psych  negative psych ROS   GI/Hepatic Neg liver ROS, hiatal hernia,GERD  ,,  Endo/Other  Hypothyroidism    Renal/GU negative Renal ROS     Musculoskeletal negative musculoskeletal ROS (+)    Abdominal   Peds  Hematology  (+) Blood dyscrasia, anemia   Anesthesia Other Findings   Reproductive/Obstetrics                             Anesthesia Physical Anesthesia Plan  ASA: 2  Anesthesia Plan: General   Post-op Pain Management: Tylenol PO (pre-op)* and Toradol IV (intra-op)*   Induction: Intravenous  PONV Risk Score and Plan: 4 or greater and Ondansetron , Dexamethasone , Midazolam and Treatment may vary due to age or medical condition  Airway Management Planned: Double Lumen EBT  Additional Equipment: Arterial line, CVP and Ultrasound Guidance Line Placement  Intra-op Plan:   Post-operative Plan: Extubation in OR  Informed Consent: I have reviewed the patients History and Physical, chart, labs and discussed the procedure including the risks, benefits and alternatives for the proposed anesthesia with the patient or authorized representative who has indicated his/her understanding and acceptance.     Dental advisory given  Plan Discussed with: CRNA  Anesthesia Plan Comments:        Anesthesia Quick Evaluation

## 2024-04-02 NOTE — Transfer of Care (Signed)
 Immediate Anesthesia Transfer of Care Note  Patient: [name]  Procedure(s) Performed: LOBECTOMY, LUNG, ROBOT-ASSISTED, USING VATS (Left: Chest) BIOPSY, LYMPH NODE (Chest) BLOCK, NERVE, INTERCOSTAL (Left: Chest)  Patient Location: PACU  Anesthesia Type:General  Level of Consciousness: awake and sedated  Airway & Oxygen Therapy: Patient Spontanous Breathing and Patient connected to face mask oxygen  Post-op Assessment: Report given to RN and Post -op Vital signs reviewed and stable  Post vital signs: Reviewed  Last Vitals:  Vitals Value Taken Time  BP 147/85 04/02/24 1130  Temp 34.8 C 04/02/24 1137  Pulse 65 04/02/24 1137  Resp 16 04/02/24 1137  SpO2 98 % 04/02/24 1137  Vitals shown include unfiled device data.  Last Pain:  Vitals:   04/02/24 0557  PainSc: 0-No pain       Pt cold despite upper, lower bair hugger and fluid warmer  Complications: No notable events documented.

## 2024-04-02 NOTE — TOC CM/SW Note (Signed)
 Transition of Care [facility]) - Inpatient Brief Assessment   Patient Details  Name: Jonika Critz MRN: [id number] Date of Birth: 05/23/1956  Transition of Care [facility]) CM/SW Contact:    Juliane Och, LCSW Phone Number: 04/02/2024, 4:24 PM   Clinical Narrative:  4:24 PM Per chart review, patient resides at home alone. Patient has a PCP and insurance. Patient does not have SNF/HH/DME history. No TOC needs were identified at this time. TOC will continue to follow and be available to assist.  Transition of Care Asessment: Insurance and Status: Insurance coverage has been reviewed Patient has primary care physician: Yes Home environment has been reviewed: Private Residence Prior level of function:: N/A Prior/Current Home Services: No current home services Social Drivers of Health Review: SDOH reviewed no interventions necessary Readmission risk has been reviewed: Yes Transition of care needs: no transition of care needs at this time

## 2024-04-02 NOTE — Anesthesia Procedure Notes (Signed)
 Arterial Line Insertion Start/End6/03/2024 7:00 AM, 04/02/2024 7:10 AM Performed by: Willian Harrow, MD, Bennett Brass, CRNA  Patient location: Pre-op. Preanesthetic checklist: patient identified, IV checked, site marked, risks and benefits discussed, surgical consent, monitors and equipment checked, pre-op evaluation, timeout performed and anesthesia consent Lidocaine  1% used for infiltration Right, radial was placed Catheter size: 20 G Hand hygiene performed  and maximum sterile barriers used   Attempts: 1 Procedure performed without using ultrasound guided technique. Following insertion, dressing applied. Post procedure assessment: normal and unchanged  Patient tolerated the procedure well with no immediate complications.

## 2024-04-02 NOTE — Hospital Course (Addendum)
 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  She is a lifelong non-smoker.   She recently had a CT for coronary calcium scoring because of a strong family history of CAD.  Her coronary score was 0.  However she was noted to have a 2.7 cm left lower lobe lung nodule.  She had a PET/CT which showed the nodule is hypermetabolic.  There was no evidence of distant disease.  She underwent robotic bronchoscopy which showed non-small cell carcinoma.   She denies chest pain, pressure, tightness, or shortness of breath.  No cough or wheezing.  No change in appetite or weight loss.  Dr. Luna Salinas reviewed the patient's diagnostic studies and determined she would benefit from surgical intervention. He reviewed the treatment options as well as the risks and benefits of surgery with the patient. Ms. Oats was agreeable to proceed with surgery.  Hospital Course: [name] presented to [facility] and was brought to the operating room on 04/02/24. She underwent left lower lobectomy and lymph node dissection. She tolerated the procedure well and was transferred to the PACU in stable condition.  Vital signs and respiratory status remained stable.  She had no airleak on postop day 1 and chest x-ray showed good expansion of both lung fields.  There was minimal drainage from the pleural tube.  The chest tube was removed on postop day 1 and follow-up chest x-ray showed a tiny apical space and was otherwise stable.  Diet and activity were advanced but she was not quite independent with mobility on postop day 2 and preferred to stay another day to work on  ambulation and pain control.

## 2024-04-03 ENCOUNTER — Encounter (HOSPITAL_COMMUNITY): Payer: Self-pay | Admitting: Thoracic Surgery (Cardiothoracic Vascular Surgery)

## 2024-04-03 ENCOUNTER — Inpatient Hospital Stay (HOSPITAL_COMMUNITY)

## 2024-04-03 LAB — BASIC METABOLIC PANEL WITH GFR
Anion gap: 8 (ref 5–15)
BUN: 14 mg/dL (ref 8–23)
CO2: 24 mmol/L (ref 22–32)
Calcium: 8.2 mg/dL — ABNORMAL LOW (ref 8.9–10.3)
Chloride: 103 mmol/L (ref 98–111)
Creatinine, Ser: 0.96 mg/dL (ref 0.44–1.00)
GFR, Estimated: >60 mL/min (ref >60)
Glucose, Bld: 127 mg/dL — ABNORMAL HIGH (ref 70–99)
Potassium: 4.2 mmol/L (ref 3.5–5.1)
Sodium: 135 mmol/L (ref 135–145)

## 2024-04-03 LAB — CBC
HCT: 35.3 % — ABNORMAL LOW (ref 36.0–46.0)
Hemoglobin: 11.2 g/dL — ABNORMAL LOW (ref 12.0–15.0)
MCH: 27.9 pg (ref 26.0–34.0)
MCHC: 31.7 g/dL (ref 30.0–36.0)
MCV: 87.8 fL (ref 80.0–100.0)
Platelets: 248 10*3/uL (ref 150–400)
RBC: 4.02 MIL/uL (ref 3.87–5.11)
RDW: 14.6 % (ref 11.5–15.5)
WBC: 10.7 10*3/uL — ABNORMAL HIGH (ref 4.0–10.5)
nRBC: 0 % (ref 0.0–0.2)

## 2024-04-03 NOTE — Op Note (Signed)
 NAMEFELIX, [name] MEDICAL RECORD NO: [id number] ACCOUNT NO: [id number] DATE OF BIRTH: [birth year] FACILITY: [facility] LOCATION: [facility] PHYSICIAN: Milon Aloe. Luna Salinas, MD  Operative Report   DATE OF PROCEDURE: 04/02/2024  PREOPERATIVE DIAGNOSIS:  Non-small cell carcinoma, left lower lobe of the lung, clinical stage IA (T1, N0).  POSTOPERATIVE DIAGNOSIS:  Non-small cell carcinoma, left lower lobe of the lung, clinical stage IA (T1, N0).  PROCEDURE:   Robotic-assisted left lower lobectomy,  Lymph node dissection,  Intercostal nerve blocks levels 3 through 10.  SURGEON:  Milon Aloe. Luna Salinas, MD  ASSISTANT:  Debroah Fanning, PA  ANESTHESIA:  General.  FINDINGS:  Mass present lower lobe, benign-appearing lymph nodes, bronchial margin negative for tumor.  CLINICAL NOTE:  Amanda Pierce is a 68 year old woman who is a lifelong nonsmoker.  She was incidentally noted to have a lung nodule on a CT for coronary calcium screening.  Biopsy showed non-small cell carcinoma.  Preoperative staging was consistent with clinical stage IA disease.  She was offered surgical resection.  The indications, risks, benefits, and alternatives were discussed in detail with the patient.  She understood and accepted the risks and agreed to proceed.  OPERATIVE NOTE:  Ms. Amanda Pierce was brought to the preoperative holding area on 04/02/2024.  Anesthesia placed an arterial blood pressure monitoring line and established intravenous access.  She was taken to the operating room, anesthetized, and intubated with a double-lumen endotracheal tube.  Intubation was difficult and took a while.  It was performed by Dr. Arlyne Lame of anesthesia.  Once the tube was secured and in good position, single lung ventilation of the right lung was initiated and was tolerated well throughout the procedure.  A Foley catheter was placed.  Sequential compression devices were placed on the calves for DVT prophylaxis.  She was placed in a right lateral decubitus  position.  A Bair hugger was placed for active warming.  The left chest was prepped and draped in the usual sterile fashion.  A timeout was performed.  A solution containing 20 mL of liposomal bupivacaine , 30 mL of 0.5% bupivacaine , and 50 mL of saline was prepared.  The solution was used for local at the incision sites as well as for the intercostal nerve blocks.  An incision was made in the eighth interspace in the midaxillary line.  An 8-mm robotic port was inserted into the chest.  The thoracoscope was advanced into the chest.  After confirming intrapleural placement, carbon dioxide was insufflated per protocol.  There were some adhesions inferiorly.  These were thin filmy adhesions that were taken down during the course of the procedure.  A 12-mm robotic port was placed in the eighth interspace anterior to the camera port.  Intercostal nerve blocks then were performed from the third to the tenth interspace by injecting 10 mL of the bupivacaine  solution into a subpleural plane at each level.  A 12-mm AirSeal port was placed in the tenth interspace posterolaterally.  Two additional eighth interspace robotic ports were placed.  The robot was deployed.  The camera arm was docked.  Targeting was performed.  The remaining arms were docked.  The robotic instruments were inserted with thoracoscopic visualization.  There was good isolation of the left lung.  Some adhesions were taken down and then the lower lobe was retracted superiorly.  The inferior pulmonary ligament was divided with bipolar cautery.  All of the dissection was done with bipolar cautery.  The level 9 node was removed.  All lymph nodes were sent as  separate specimens for permanent pathology.  All appeared grossly benign.  The lung then was retracted anteriorly and the pleural reflection was divided at the hilum posteriorly.  Level 8 and level 7 nodes were removed.  Working superior to the bronchus, a level 4L node was removed.  There was a large  level 10 node along the pulmonary artery which was removed.  The upper lobe then was retracted inferiorly and the pleura over the aortopulmonary  window was incised.  There was a large but benign-appearing level 5 node which was removed.  Care was taken not to use cautery in the vicinity of the phrenic nerve and the vagus nerve.  The pleural reflection was divided at the hilum anteriorly.  The phrenic nerve was again identified and no cautery was used in its vicinity.  The fissure then was inspected.  There were extensive thin filmy adhesions.  These were taken down.  The fissure was complete posteriorly.  It was incomplete anteriorly.  Posteriorly, the pleura was incised over the pulmonary artery.  Level 12 nodes were removed and a level 13 node at the bifurcation of the superior segmental branch of the lower lobe was removed as well.  The lung then was retracted posteriorly and  dissection was carried into the space between the inferior and superior pulmonary veins.  The level 11 nodes were removed from that area.  The pulmonary artery was identified.  The fissure then was completed with sequential firings of the robotic stapler using blue cartridges.  The robotic stapler then was used to divide the superior segmental pulmonary artery branch.  Next, the inferior pulmonary vein was encircled and divided with a robotic stapler.  An additional node was harvested and then the stapler was placed across the basilar segmental arterial trunk and fired, dividing it.  Finally, the stapler was placed across the left lower lobe bronchus.  It was closed.  Test inflation showed good aeration of the upper lobe.  The staple was fired, transecting the lower lobe bronchus.  The vessel loop and sponges used during the dissection were removed.  The chest was copiously irrigated with saline.  A test inflation to 30 cm of H2O pressure revealed no air leak.  The robotic instruments were removed.  The robot was undocked.  The anterior  eighth interspace incision was lengthened to 3 cm.  A 15-mm endoscopic retrieval bag was placed into the chest and the lower lobe was manipulated into the bag and then removed and sent for frozen section of the bronchial margin.  That subsequently returned with no tumor seen.  All the port sites and staple lines were inspected for hemostasis, which was good.  A 28-French Blake drain was placed through the original port incision and directed to the apex.  It was secured with #1 silk suture.  The remaining incisions were closed in standard fashion.  The chest tube was placed to Pleur-evac on water seal.  The patient was placed back in the supine position.  She was extubated in the operating room and taken to the post-anesthetic care unit in good condition.  All sponge, needle, and instrument counts were correct at the end of the procedure.  Experienced assistance was necessary for this case due to surgical complexity.  Debroah Fanning assisted with port placement, robot docking and undocking, instrument exchange, specimen retrieval, suctioning, and wound closure.   SHW D: 04/02/2024 3:54:17 pm T: 04/03/2024 3:49:00 am  JOB: [id number]/ [id number]

## 2024-04-03 NOTE — Progress Notes (Signed)
      [address]       Amanda Pierce [address]             [phone]      1 Day Post-Op Procedure(s) (LRB): LOBECTOMY, LUNG, ROBOT-ASSISTED, USING VATS (Left) BIOPSY, LYMPH NODE BLOCK, NERVE, INTERCOSTAL (Left) Subjective: Awake and alert.  Pain control reasonable.  Objective: Vital signs in last 24 hours: Temp:  [94.6 F (34.8 C)-98.5 F (36.9 C)] 98 F (36.7 C) (06/07 0753) Pulse Rate:  [51-66] 58 (06/07 0753) Cardiac Rhythm: Normal sinus rhythm (06/07 0700) Resp:  [9-20] 10 (06/07 0753) BP: (100-154)/(57-92) 110/63 (06/07 0753) SpO2:  [93 %-100 %] 94 % (06/07 0753) Arterial Line BP: (145-162)/(66-84) 145/66 (06/06 1300) Weight:  [102.5 kg] 102.5 kg (06/06 1340)    Intake/Output from previous day: 06/06 0701 - 06/07 0700 In: 2692.3 [I.V.:2364.3; IV Piggyback:328] Out: 1285 [Urine:1080; Blood:25; Chest Tube:180] Intake/Output this shift: No intake/output data recorded.  General appearance: alert, cooperative, and no distress Neurologic: intact Heart: Regular rate and rhythm Lungs: Normal work of breathing.  Oxygenating well.  No airleak in chest tube and minimal drainage over the past 12 hours.  Chest x-ray shows both lungs are well-expanded. Abdomen: Soft, no tenderness Wound: Port site port sites are dry.  Chest tube is secured  Lab Results: Recent Labs    03/31/24 1200 04/03/24 0450  WBC 4.8 10.7*  HGB 12.5 11.2*  HCT 39.3 35.3*  PLT 248 248   BMET:  Recent Labs    03/31/24 1200 04/03/24 0450  NA 139 135  K 4.4 4.2  CL 105 103  CO2 26 24  GLUCOSE 109* 127*  BUN 12 14  CREATININE 0.84 0.96  CALCIUM 9.0 8.2*    PT/INR:  Recent Labs    03/31/24 1200  LABPROT 13.5  INR 1.0   ABG No results found for: "PHART", "HCO3", "TCO2", "ACIDBASEDEF", "O2SAT" CBG (last 3)  No results for input(s): "GLUCAP" in the last 72 hours.  Assessment/Plan: S/P Procedure(s) (LRB): LOBECTOMY, LUNG, ROBOT-ASSISTED, USING VATS (Left) BIOPSY,  LYMPH NODE BLOCK, NERVE, INTERCOSTAL (Left)   - Postop day 1 robotic assisted left lower lobectomy.  No airleak and chest x-ray is stable with no pneumothorax.  Will remove the chest tube today.   DC IV fluids.  Advance diet as tolerated, mobilize.  - Heme: History of iron  deficiency anemia and mild expected acute blood loss anemia.  Medically stable.  Tolerating well.  -History of hypothyroidism: Will resume her levothyroxine  today.  -DVT prophylaxis: Ambulate, subcu enoxaparin  daily  - Disposition: Anticipate eventual discharge back to home in 1 to 2 days.   LOS: 1 day    [name] 04/03/2024  Agree Doing well  [name]

## 2024-04-04 ENCOUNTER — Inpatient Hospital Stay (HOSPITAL_COMMUNITY)

## 2024-04-04 LAB — COMPREHENSIVE METABOLIC PANEL WITH GFR
ALT: 14 U/L (ref 0–44)
AST: 26 U/L (ref 15–41)
Albumin: 2.9 g/dL — ABNORMAL LOW (ref 3.5–5.0)
Alkaline Phosphatase: 45 U/L (ref 38–126)
Anion gap: 5 (ref 5–15)
BUN: 10 mg/dL (ref 8–23)
CO2: 25 mmol/L (ref 22–32)
Calcium: 8.3 mg/dL — ABNORMAL LOW (ref 8.9–10.3)
Chloride: 105 mmol/L (ref 98–111)
Creatinine, Ser: 0.87 mg/dL (ref 0.44–1.00)
GFR, Estimated: >60 mL/min (ref >60)
Glucose, Bld: 101 mg/dL — ABNORMAL HIGH (ref 70–99)
Potassium: 4.1 mmol/L (ref 3.5–5.1)
Sodium: 135 mmol/L (ref 135–145)
Total Bilirubin: 0.8 mg/dL (ref 0.0–1.2)
Total Protein: 6.1 g/dL — ABNORMAL LOW (ref 6.5–8.1)

## 2024-04-04 LAB — CBC
HCT: 35.8 % — ABNORMAL LOW (ref 36.0–46.0)
Hemoglobin: 11.4 g/dL — ABNORMAL LOW (ref 12.0–15.0)
MCH: 28.1 pg (ref 26.0–34.0)
MCHC: 31.8 g/dL (ref 30.0–36.0)
MCV: 88.2 fL (ref 80.0–100.0)
Platelets: 237 10*3/uL (ref 150–400)
RBC: 4.06 MIL/uL (ref 3.87–5.11)
RDW: 14.4 % (ref 11.5–15.5)
WBC: 5.6 10*3/uL (ref 4.0–10.5)
nRBC: 0 % (ref 0.0–0.2)

## 2024-04-04 MED ORDER — ALBUTEROL SULFATE (2.5 MG/3ML) 0.083% IN NEBU
2.5000 mg | INHALATION_SOLUTION | Freq: Four times a day (QID) | RESPIRATORY_TRACT | Status: DC | PRN
  Administered 2024-04-04: 2.5 mg via RESPIRATORY_TRACT
  Filled 2024-04-04: qty 3

## 2024-04-04 MED ORDER — LACTULOSE 10 GM/15ML PO SOLN
20.0000 g | Freq: Once | ORAL | Status: AC
  Administered 2024-04-04: 20 g via ORAL
  Filled 2024-04-04: qty 30

## 2024-04-04 NOTE — Progress Notes (Addendum)
      [address]       Amanda Pierce [address]             [phone]      2 Days Post-Op Procedure(s) (LRB): LOBECTOMY, LUNG, ROBOT-ASSISTED, USING VATS (Left) BIOPSY, LYMPH NODE BLOCK, NERVE, INTERCOSTAL (Left) Subjective: Resting in bed.  Awake and alert.  Notes more soreness in her chest today than she had yesterday. Walked a few steps in her room yesterday but has not been out in the hall yet. Currently on room air. No bowel movement since surgery  Objective: Vital signs in last 24 hours: Temp:  [97.6 F (36.4 C)-97.9 F (36.6 C)] 97.6 F (36.4 C) (06/08 0731) Pulse Rate:  [54-72] 55 (06/08 0731) Cardiac Rhythm: Sinus bradycardia (06/08 0700) Resp:  [11-20] 11 (06/08 0731) BP: (121-152)/(71-74) 134/73 (06/08 0731) SpO2:  [91 %-98 %] 95 % (06/08 0731)    Intake/Output from previous day: 06/07 0701 - 06/08 0700 In: 938.2 [P.O.:540; I.V.:398.2] Out: 1950 [Urine:1950] Intake/Output this shift: Total I/O In: 120 [P.O.:120] Out: 100 [Urine:100]  General appearance: alert, cooperative, and no distress Neurologic: intact Heart: Regular rate and rhythm Lungs: Normal work of breathing on room air and breath sounds are clear.  Oxygenating well.  Chest x-ray post chest tube removal shows a small apical pneumothorax. Abdomen: Soft, no tenderness Wound: Port site port sites are dry and the chest tube exit site is covered with a dry dressing.  There is no subcu air.   Lab Results: Recent Labs    04/03/24 0450 04/04/24 0510  WBC 10.7* 5.6  HGB 11.2* 11.4*  HCT 35.3* 35.8*  PLT 248 237   BMET:  Recent Labs    04/03/24 0450 04/04/24 0510  NA 135 135  K 4.2 4.1  CL 103 105  CO2 24 25  GLUCOSE 127* 101*  BUN 14 10  CREATININE 0.96 0.87  CALCIUM 8.2* 8.3*    PT/INR:  No results for input(s): "LABPROT", "INR" in the last 72 hours.  ABG No results found for: "PHART", "HCO3", "TCO2", "ACIDBASEDEF", "O2SAT" CBG (last 3)  No results for  input(s): "GLUCAP" in the last 72 hours.  Assessment/Plan: S/P Procedure(s) (LRB): LOBECTOMY, LUNG, ROBOT-ASSISTED, USING VATS (Left) BIOPSY, LYMPH NODE BLOCK, NERVE, INTERCOSTAL (Left)   - Postop day 2 robotic assisted left lower lobectomy.  Stable respiratory status post chest tube removal.  Chest x-ray shows a very small apical pneumothorax following chest tube removal.  - Heme: History of iron  deficiency anemia and mild expected acute blood loss anemia.  Medically stable.  Tolerating well.  -GI: Tolerating p.o.'s but no bowel movement since surgery.  Lactulose today.  -History of hypothyroidism: Will resume her levothyroxine  today.  -DVT prophylaxis: Ambulate, subcu enoxaparin  daily  - Disposition: Ms. Houser does not feel she is ready to return home yet.  She has only had limited mobility in her room since surgery.  Will work on ambulation today.  Chest x-ray in a.m. to follow-up on the small pneumothorax.  Plan for discharge tomorrow if the x-ray is stable.    LOS: 2 days    Myron G. Roddenberry, PA-C 04/04/2024   Agree Pain control Dispo tomorrow  Hilarie Lovely

## 2024-04-05 ENCOUNTER — Inpatient Hospital Stay (HOSPITAL_COMMUNITY)

## 2024-04-05 LAB — SURGICAL PATHOLOGY

## 2024-04-05 MED ORDER — GABAPENTIN 300 MG PO CAPS: 300.0000 mg | ORAL_CAPSULE | Freq: Two times a day (BID) | ORAL | 1 refills | Status: DC

## 2024-04-05 MED ORDER — OXYCODONE HCL 5 MG PO TABS
5.0000 mg | ORAL_TABLET | ORAL | 0 refills | Status: DC | PRN
Start: 2024-04-05 — End: 2024-04-23

## 2024-04-05 NOTE — Progress Notes (Signed)
 Right internal jugular central line dcd. Site unremarkable. Patient tolerated procedure well.

## 2024-04-05 NOTE — Discharge Summary (Incomplete)
 Physician Discharge Summary  Patient ID: Amanda Pierce MRN: [id number] DOB/AGE: May 20, 1956 68 y.o.  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  She is a lifelong non-smoker.   She recently had a CT for coronary calcium scoring because of a strong family history of CAD.  Her coronary score was 0.  However she was noted to have a 2.7 cm left lower lobe lung nodule.  She had a PET/CT which showed the nodule is hypermetabolic.  There was no evidence of distant disease.  She underwent robotic bronchoscopy which showed non-small cell carcinoma.   She denies chest pain, pressure, tightness, or shortness of breath.  No cough or wheezing.  No change in appetite or weight loss.  Dr. Luna Salinas reviewed the patient's diagnostic studies and determined she would benefit from surgical intervention. He reviewed the treatment options as well as the risks and benefits of surgery with the patient. Amanda Pierce was agreeable to proceed with surgery.  Hospital Course: Amanda Pierce presented to [facility] and was brought to the operating room on 04/02/24. She underwent left lower lobectomy and lymph node dissection. She tolerated the procedure well and was transferred to the PACU in stable condition.  Vital signs and respiratory status remained stable.  She had no airleak on postop day 1 and chest x-ray showed good expansion of both lung fields.  There was minimal drainage  from the pleural tube.  The chest tube was removed on postop day 1 and follow-up chest x-ray showed a tiny apical space and was otherwise stable.  Diet and activity were advanced but she was not quite independent with mobility on postop day 2 and preferred to stay another day to work on ambulation and pain control.  She had mild hoarseness from ET tube.  This should improve with time.  She was able to ambulate without difficulty.  Her surgical incisions are healing without evidence of infection.  She is stable for discharge home today.     Consults: {consultation:18241}  Significant Diagnostic Studies:  ADDENDUM REPORT: 01/16/2024 08:10   ADDENDUM: OVER-READ INTERPRETATION  CT CHEST   The following report is an over-read performed by radiologist Dr. Avanell Bob [facility] on 01/16/2024. This over-read does not include interpretation of cardiac or coronary anatomy or pathology. The interpretation by the cardiologist is attached.   COMPARISON:  None.   FINDINGS: Within the first image of the study there is a lobular soft tissue density or mass that measures 2.6 x 2.6 cm. Projects along the retrosternal anterior mediastinal space, superior to the arch, this could represent the the lower most portion of the thyroid mass and correlation with a complete CT of the chest with supine to the lower neck is recommended. Ideally with IV contrast.   No other masses or adenopathy.   No significant calcifications   Lung windows demonstrates a lobular spiculated aggressively appearing mass in the left lower lobe measuring 2.7 x 2.7 cm. Malignancy should be excluded. Correlation with PET-CT imaging and or tissue sampling is recommended.  IMPRESSION: *Lobular spiculated aggressively appearing mass in the left lower lobe measuring 2.7 x 2.7 cm. Malignancy should be excluded. Correlation with PET-CT imaging and or tissue sampling is recommended. *Within the first image of the  study there is a lobular soft tissue density or mass that measures 2.6 x 2.6 cm. Projects along the retrosternal anterior mediastinal space, superior to the arch, this could represent the lower most portion of the thyroid mass and correlation with a complete CT of the chest with supine to the lower neck is recommended. Ideally with IV contrast.     Electronically Signed   By: Fredrich Jefferson M.D.   On: 01/16/2024 08:10     Treatments: surgery: Operative Report    DATE OF PROCEDURE: 04/02/2024   PREOPERATIVE DIAGNOSIS:  Non-small cell carcinoma, left lower lobe of the lung, clinical stage IA (T1, N0).   POSTOPERATIVE DIAGNOSIS:  Non-small cell carcinoma, left lower lobe of the lung, clinical stage IA (T1, N0).   PROCEDURE:  Robotic-assisted left lower lobectomy, lymph node dissection, intercostal nerve blocks levels 3 through 10.   SURGEON:  Milon Aloe. Luna Salinas, MD  PATHOLOGY:  Discharge Exam: Blood pressure 126/71, pulse (!) 55, temperature 97.8 F (36.6 C), temperature source Oral, resp. rate 18, height 5\' 8"  (1.727 m), weight 102.5 kg, SpO2 96%. {physical QMVH:8469629}  Disposition: Discharge disposition: 01-Home or Self Care        Allergies as of 04/05/2024       Reactions   Aspirin Anaphylaxis   Contrast Media [iodinated Contrast Media] Nausea And Vomiting, Palpitations   Elemental Sulfur Hives, Rash   Ibuprofen Anaphylaxis   Latex Rash   Any latex adhesive, rubber compounds:  examples: ekg leads,etc.   Penicillins Anaphylaxis   Did it involve swelling of the face/tongue/throat, SOB, or low BP? Yes Did it involve sudden or severe rash/hives, skin peeling, or any reaction on the inside of your mouth or nose? No Did you need to seek medical attention at a hospital or doctor's office? Yes When did it last happen?      40 years ago/ Patient was in the hospital when happend If all above answers are "NO", may proceed with cephalosporin use.   Rubbing Alcohol  [alcohol] Other (See Comments)   Severe skin warmth   Shellfish Allergy Anaphylaxis   Adhesive [tape]    Able to tolerate Paper Tape   Medical Adhesive Remover    Other reaction(s): Not available   Sulfa Antibiotics    Other reaction(s): Not available        Medication List     TAKE these medications    acetaminophen  500 MG tablet Commonly known as: TYLENOL  Take 500-1,000 mg by mouth every 6 (six) hours as needed (pain.).   fexofenadine 180 MG tablet Commonly known as: ALLEGRA Take 180 mg by mouth daily as needed for allergies or rhinitis.   fluticasone  50 MCG/ACT nasal spray Commonly known as: FLONASE  PLACE 2 SPRAYS INTO BOTH NOSTRILS DAILY. USE FOR 4-6 WEEKS THEN STOP AND USE SEASONALLY OR AS NEEDED   gabapentin  300 MG capsule Commonly known as: NEURONTIN  Take 1 capsule (300 mg total) by mouth 2 (two) times daily.   montelukast  10 MG tablet Commonly known as: SINGULAIR  TAKE 1 TABLET BY MOUTH EVERYDAY AT BEDTIME What changed: See the new instructions.   nystatin cream Commonly known as: MYCOSTATIN Apply 1 application  topically 2 (two) times daily as needed (skin irritation.).   oxyCODONE  5 MG immediate release tablet Commonly known as:  Oxy IR/ROXICODONE  Take 1 tablet (5 mg total) by mouth every 4 (four) hours as needed for moderate pain (pain score 4-6).   Synthroid  125 MCG tablet Generic drug: levothyroxine  Take 125 mcg by mouth daily before breakfast.   triamcinolone  cream 0.1 % Commonly known as: KENALOG  Apply 1 Application topically 2 (two) times daily as needed (skin irritation.).   Vitamin D3 125 MCG (5000 UT) Caps Take 1 capsule (5,000 Units total) by mouth daily. For 8 weeks, then start Vitamin D3 2,000 units daily (OTC) What changed: additional instructions               Durable Medical Equipment  (From admission, onward)           Start     Ordered   04/05/24 0741  For home use only DME Walker rolling  Once       Question Answer  Comment  Walker: With 5 Inch Wheels   Patient needs a walker to treat with the following condition Physical deconditioning      04/05/24 0741            Follow-up Information     Zelphia Higashi, MD Follow up on 04/20/2024.   Specialty: Cardiothoracic Surgery Why: Appointment is at 9:15, please get CXR at 8:30 prior to your appointment at Dr. Audra Blend office on the 2nd floor of our office building Contact information: [address] [place] [address] [phone]         [facility] Follow up on 04/12/2024.   Specialty: Cardiothoracic Surgery Why: For suture removal with nurse at 10:30 Contact information: [address], [address] [place]  [address] [phone]                Signed: Randa Burton, PA-C  04/05/2024, 12:26 PM \

## 2024-04-05 NOTE — Discharge Instructions (Signed)
 Discharge Instructions:  1. You may shower, please wash incisions daily with soap and water and keep dry.  If you wish to cover wounds with dressing you may do so but please keep clean and change daily.  No tub baths or swimming until incisions have completely healed.  If your incisions become red or develop any drainage please call our office at [phone]  2. No Driving until cleared by Dr. Audree Bless office and you are no longer using narcotic pain medications  3. Fever of 101.5 for at least 24 hours with no source, please contact our office at [phone]  4. Activity- up as tolerated, please walk at least 3 times per day.  Avoid strenuous activity on surgical side  5. If any questions or concerns arise, please do not hesitate to contact our office at [phone]

## 2024-04-05 NOTE — Care Management Important Message (Signed)
 Important Message  Patient Details  Name: Amanda Pierce MRN: [id number] Date of Birth: 12-17-1955   Important Message Given:  Yes - Medicare IM     Wynonia Hedges 04/05/2024, 8:35 AM

## 2024-04-05 NOTE — Progress Notes (Addendum)
      [address]       [place] [address]             [phone]    3 Days Post-Op Procedure(s) (LRB): LOBECTOMY, LUNG, ROBOT-ASSISTED, USING VATS (Left) BIOPSY, LYMPH NODE BLOCK, NERVE, INTERCOSTAL (Left)  Subjective:  Patient has yet to be walked out of her room since surgery.  Pain is under better control.  She continues to experience hoarseness.  She has moved her bowels.    Objective: Vital signs in last 24 hours: Temp:  [97.6 F (36.4 C)-98.3 F (36.8 C)] 98 F (36.7 C) (06/09 0510) Pulse Rate:  [56-76] 76 (06/09 0510) Cardiac Rhythm: Normal sinus rhythm (06/08 1900) Resp:  [13-17] 16 (06/09 0510) BP: (118-168)/(65-90) 123/71 (06/09 0510) SpO2:  [93 %-97 %] 94 % (06/09 0510)  Intake/Output from previous day: 06/08 0701 - 06/09 0700 In: 720 [P.O.:720] Out: 1500 [Urine:1500]  General appearance: alert, cooperative, and no distress Heart: regular rate and rhythm Lungs: clear to auscultation bilaterally and upper airway auditory wheezing Abdomen: soft, non-tender; bowel sounds normal; no masses,  no organomegaly Extremities: extremities normal, atraumatic, no cyanosis or edema Wound: clean and dry  Lab Results: Recent Labs    04/03/24 0450 04/04/24 0510  WBC 10.7* 5.6  HGB 11.2* 11.4*  HCT 35.3* 35.8*  PLT 248 237   BMET:  Recent Labs    04/03/24 0450 04/04/24 0510  NA 135 135  K 4.2 4.1  CL 103 105  CO2 24 25  GLUCOSE 127* 101*  BUN 14 10  CREATININE 0.96 0.87  CALCIUM 8.2* 8.3*    PT/INR: No results for input(s): "LABPROT", "INR" in the last 72 hours. ABG No results found for: "PHART", "HCO3", "TCO2", "ACIDBASEDEF", "O2SAT" CBG (last 3)  No results for input(s): "GLUCAP" in the last 72 hours.  Assessment/Plan: S/P Procedure(s) (LRB): LOBECTOMY, LUNG, ROBOT-ASSISTED, USING VATS (Left) BIOPSY, LYMPH NODE BLOCK, NERVE, INTERCOSTAL (Left)  CV-maintaining NSR, BP is normal Pulm- CT removed w/o difficulty.. tiny apical  pneumothorax remains stable to improved.. not requiring oxygen ENT- hoarseness, upper airway wheezing.. 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 ? Benefit from steroid coarse to help with inflammation? Will discuss with Dr. Luna Salinas Hypothyroidism- continue synthroid  Ambulation- patient has not been ambulated out of room since surgery.Aaron Aas asked nurse to do this prior to discharge today... will order rolling walker per patient request Dispo- patient stable, ambulate this morning, order walker.. for d/c today    LOS: 3 days    Gates Kasal, PA-C 04/05/2024 Patient seen and examined, agree with above Home today  Landon Pinion C. Luna Salinas, MD [facility] [phone]

## 2024-04-05 NOTE — Discharge Summary (Addendum)
 Physician Discharge Summary  Patient ID: Amanda Pierce MRN: [id number] DOB/AGE: 04/18/1956 68 y.o.  Admit date: 04/02/2024 Discharge date: 04/05/2024  Admission Diagnoses: Non-small cell carcinoma left lower lobe-clinical stage Ia (T1, N0)  Patient Active Problem List   Diagnosis Date Noted   Non-small cell cancer of left lung (HCC) 02/04/2024   Lung nodule, solitary 01/20/2024   Sprain of shoulder and upper arm 05/09/2020   Impingement syndrome of shoulder region 05/09/2020   Stomach irritation    Gastric polyp    Special screening for malignant neoplasms, colon    Pain in joint of right shoulder 04/15/2019   Iron  deficiency anemia 02/26/2019   Vitamin D  deficiency 01/04/2019   Elevated BP without diagnosis of hypertension 12/07/2018   Osteopenia of spine 09/17/2018   Hiatal hernia 08/28/2018   Obesity (BMI 30.0-34.9) 05/30/2018   Gastroesophageal reflux disease without esophagitis 05/30/2018   Pre-diabetes 05/30/2018   Pure hypercholesterolemia 05/30/2018   Hypothyroidism (acquired) 05/29/2018   Suspected sleep apnea 05/29/2018   Discharge Diagnoses: Adenocarcinoma left lower lobe-pathologic stage Ib (pT2a, pN0)  Patient Active Problem List   Diagnosis Date Noted   S/P lobectomy of lung 04/02/2024   Non-small cell cancer of left lung (HCC) 02/04/2024   Lung nodule, solitary 01/20/2024   Sprain of shoulder and upper arm 05/09/2020   Impingement syndrome of shoulder region 05/09/2020   Stomach irritation    Gastric polyp    Special screening for malignant neoplasms, colon    Pain in joint of right shoulder 04/15/2019   Iron  deficiency anemia 02/26/2019   Vitamin D  deficiency 01/04/2019   Elevated BP without diagnosis of hypertension 12/07/2018   Osteopenia of spine 09/17/2018   Hiatal hernia 08/28/2018   Obesity (BMI 30.0-34.9) 05/30/2018   Gastroesophageal reflux disease without esophagitis 05/30/2018   Pre-diabetes 05/30/2018   Pure hypercholesterolemia 05/30/2018    Hypothyroidism (acquired) 05/29/2018   Suspected sleep apnea 05/29/2018   Discharged Condition: good  HPI:  Amanda Pierce is sent for consultation regarding a 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  She is a lifelong non-smoker.   She recently had a CT for coronary calcium scoring because of a strong family history of CAD.  Her coronary score was 0.  However she was noted to have a 2.7 cm left lower lobe lung nodule.  She had a PET/CT which showed the nodule is hypermetabolic.  There was no evidence of distant disease.  She underwent robotic bronchoscopy which showed non-small cell carcinoma.   She denies chest pain, pressure, tightness, or shortness of breath.  No cough or wheezing.  No change in appetite or weight loss.  Dr. Luna Salinas reviewed the patient's diagnostic studies and determined she would benefit from surgical intervention. He reviewed the treatment options as well as the risks and benefits of surgery with the patient. Amanda Pierce was agreeable to proceed with surgery.  Hospital Course: Amanda Pierce presented to [facility] and was brought to the operating room on 04/02/24. She underwent left lower lobectomy and lymph node dissection. She tolerated the procedure well and was transferred to the PACU in stable condition.  Vital signs and respiratory status remained stable.  She had no airleak on postop day 1 and chest x-ray showed good expansion  of both lung fields.  There was minimal drainage from the pleural tube.  The chest tube was removed on postop day 1 and follow-up chest x-ray showed a tiny apical space and was otherwise stable.  Diet and activity were advanced but  she was not quite independent with mobility on postop day 2 and preferred to stay another day to work on ambulation and pain control.  She had mild hoarseness from ET tube.  This should improve with time.  She was able to ambulate without difficulty.  Her surgical incisions are healing without evidence of infection.  She is stable for discharge home today.     Consults: None  Significant Diagnostic Studies:   FINDINGS: Mediastinal blood pool activity: SUV max 2.8   Liver activity: SUV max 3.1   NECK: There is diffuse uptake involving the right side of the thyroid gland with maximum SUV approaching 9.0. An aggressive or malignant process is possible. Recommend further workup such as thyroid ultrasound when appropriate. Otherwise no specific abnormal radiotracer uptake seen along lymph node change of the submandibular, posterior triangle or internal jugular regions. The visualized intracranial compartment has near symmetric uptake.   Incidental CT findings: Visualized paranasal sinuses and mastoid air cells are clear. Streak artifact related to the patient's dental hardware. Enlarged right thyroid lobe. The submandibular glands and the parotid glands are grossly preserved.   CHEST: No specific abnormal radiotracer uptake above mediastinal blood pool in the axillary regions, hilum or mediastinum. There is focal uptake seen in the area of the cavitary lesion in the medial posterior left lower lobe with maximum SUV value of 4.5, mild. The spiculated lesion previously measured 2.7 x 2.7 cm and today on CT image 66 of series 6 measures 2.7 by 2.7 cm once again. Based on appearance a low-grade malignant lesion is in the differential. No other areas of abnormal uptake along the lung parenchyma.   Incidental CT findings: No pleural effusion or pneumothorax. Tiny nodule in the middle lobe measures 3 mm on image 60 of the CT scan. No pericardial effusion. Grossly normal caliber thoracic  aorta with some mild vascular calcifications.   ABDOMEN/PELVIS: There is physiologic distribution radiotracer along the parenchymal organs, bowel and renal collecting systems.   Incidental CT findings: Stone in gallbladder. Small right-sided benign appendix cystic focus suggested. No obstructing renal stones. Upper pole right-sided benign-appearing renal cystic focus also seen. This has Hounsfield unit of 8 on series 6, image 84. No renal or ureteral stones suggested. Uterus is present. The visualized bowel is nondilated with diffuse colonic stool. Normal appendix in the right lower quadrant posterior to the cecum. Grossly the spleen, pancreas and liver are preserved.   SKELETON: No specific abnormal radiotracer uptake along the visualized skeleton.   IMPRESSION: Cavitary spiculated left lower lobe lung nodule has low level abnormal radiotracer uptake. Based on the appearance as well as the low level abnormal uptake, a malignant lesion is in the differential.   No additional lung or areas of nodal abnormal uptake.   Asymmetrically enlarged right thyroid lobe with diffuse abnormal radiotracer uptake. An aggressive thyroid lesion is possible. Please correlate for known history or dedicated thyroid ultrasound as the next step in the workup when able.     Electronically Signed   By: Adrianna Horde M.D.   On: 02/04/2024 16:24   Treatments: surgery:  NAME: ORA, BOLLIG MEDICAL RECORD NO: [id number] ACCOUNT NO: [id number] DATE OF BIRTH: July 08, 1956 FACILITY: [facility] LOCATION: [facility] PHYSICIAN: Milon Aloe. Luna Salinas, MD  Operative Report    DATE OF PROCEDURE: 04/02/2024   PREOPERATIVE DIAGNOSIS:  Non-small cell carcinoma, left lower lobe of the lung, clinical stage IA (T1, N0).   POSTOPERATIVE DIAGNOSIS:  Non-small cell carcinoma, left lower lobe of the lung, clinical stage IA (T1, N0).   PROCEDURE:  Robotic-assisted left lower lobectomy, lymph node dissection, intercostal nerve  blocks levels 3 through 10.   SURGEON:  Milon Aloe. Luna Salinas, MD   ASSISTANT:  Debroah Fanning, PA  PATHOLOGY: Remains Pending  Discharge Exam: Blood pressure 125/68, pulse 64, temperature 97.8 F (36.6 C), temperature source Oral, resp. rate 16, height 5' 8 (1.727 m), weight 102.5 kg, SpO2 96%.  General appearance: alert, cooperative, and no distress Heart: regular rate and rhythm Lungs: clear to auscultation bilaterally and upper airway auditory wheezing Abdomen: soft, non-tender; bowel sounds normal; no masses,  no organomegaly Extremities: extremities normal, atraumatic, no cyanosis or edema Wound: clean and dry  Discharge disposition: 01-Home or Self Care   Allergies as of 04/05/2024       Reactions   Aspirin Anaphylaxis   Contrast Media [iodinated Contrast Media] Nausea And Vomiting, Palpitations   Elemental Sulfur Hives, Rash   Ibuprofen Anaphylaxis   Latex Rash   Any latex adhesive, rubber compounds:  examples: ekg leads,etc.   Penicillins Anaphylaxis   Did it involve swelling of the face/tongue/throat, SOB, or low BP? Yes Did it involve sudden or severe rash/hives, skin peeling, or any reaction on the inside of your mouth or nose? No Did you need to seek medical attention at a hospital or doctor's office? Yes When did it last happen?      40 years ago/ Patient was in the hospital when happend If all above answers are NO, may proceed with cephalosporin use.   Rubbing Alcohol [alcohol] Other (See Comments)   Severe skin warmth   Shellfish Allergy Anaphylaxis   Adhesive [tape]    Able to tolerate Paper Tape   Medical Adhesive Remover    Other reaction(s): Not available   Sulfa Antibiotics    Other reaction(s): Not available        Medication List     TAKE these medications    acetaminophen  500 MG tablet Commonly known as: TYLENOL  Take 500-1,000 mg by mouth every 6 (six) hours as needed (pain.).   fexofenadine 180 MG tablet Commonly known as:  ALLEGRA Take 180 mg by mouth daily as needed for allergies or rhinitis.   fluticasone  50 MCG/ACT nasal spray Commonly known as: FLONASE  PLACE 2 SPRAYS INTO BOTH NOSTRILS DAILY. USE FOR 4-6 WEEKS THEN STOP AND USE SEASONALLY OR AS NEEDED   gabapentin  300 MG capsule Commonly known as: NEURONTIN  Take 1 capsule (300 mg total) by mouth 2 (two) times daily.   montelukast  10 MG tablet Commonly known as: SINGULAIR  TAKE 1 TABLET BY MOUTH EVERYDAY AT BEDTIME What changed: See the new instructions.   nystatin cream Commonly known as: MYCOSTATIN Apply 1 application  topically 2 (two) times daily as needed (skin irritation.).   oxyCODONE  5 MG immediate release tablet Commonly known as: Oxy IR/ROXICODONE  Take 1 tablet (5 mg total) by mouth every 4 (four) hours as needed for moderate pain (pain score 4-6).   Synthroid  125 MCG tablet Generic drug: levothyroxine  Take 125 mcg by mouth daily before breakfast.   triamcinolone  cream 0.1 % Commonly known as: KENALOG  Apply 1 Application topically 2 (two) times daily as needed (skin irritation.).   Vitamin D3 125 MCG (5000 UT) Caps Take 1 capsule (  5,000 Units total) by mouth daily. For 8 weeks, then start Vitamin D3 2,000 units daily (OTC) What changed: additional instructions               Durable Medical Equipment  (From admission, onward)           Start     Ordered   04/05/24 0741  For home use only DME Walker rolling  Once       Question Answer Comment  Walker: With 5 Inch Wheels   Patient needs a walker to treat with the following condition Physical deconditioning      04/05/24 0741            Follow-up Information     Zelphia Higashi, MD Follow up on 04/20/2024.   Specialty: Cardiothoracic Surgery Why: Appointment is at 9:15, please get CXR at 8:30 prior to your appointment at Dr. Audra Blend office on the 2nd floor of our office building Contact information: [address] [place]  [address] [phone]         [facility] Dept. of [facility] Follow up on 04/12/2024.   Specialty: Cardiothoracic Surgery Why: For suture removal with nurse at 10:30 Contact information: [address] [place]  [address] [phone]                Signed: Gates Kasal, PA-C  04/05/2024, 8:47 AM

## 2024-04-12 ENCOUNTER — Ambulatory Visit: Payer: Self-pay

## 2024-04-13 ENCOUNTER — Ambulatory Visit: Payer: Self-pay | Attending: Thoracic Surgery (Cardiothoracic Vascular Surgery) | Admitting: *Deleted

## 2024-04-13 DIAGNOSIS — Z4802 Encounter for removal of sutures: Secondary | ICD-10-CM

## 2024-04-13 NOTE — Progress Notes (Signed)
 Patient arrived for nurse visit to remove sutures post- left RATS Lobectomy 6/6.  One suture removed with no signs or symptoms of infection noted.  Incision well approximated. Patient tolerated suture removal well.  Patient and family instructed to keep the incision site clean and dry. Advised to avoid using any ointments or creams on incisions. Patient and family acknowledged instructions given.  All questions answered.

## 2024-04-15 ENCOUNTER — Other Ambulatory Visit: Payer: Self-pay

## 2024-04-16 ENCOUNTER — Other Ambulatory Visit: Payer: Self-pay | Admitting: Thoracic Surgery (Cardiothoracic Vascular Surgery)

## 2024-04-16 DIAGNOSIS — C3492 Malignant neoplasm of unspecified part of left bronchus or lung: Secondary | ICD-10-CM

## 2024-04-17 NOTE — Progress Notes (Signed)
 The proposed treatment discussed in conference is for discussion purpose only and is not a binding recommendation.  The patients have not been physically examined, or presented with their treatment options.  Therefore, final treatment plans cannot be decided.

## 2024-04-20 ENCOUNTER — Ambulatory Visit
Payer: Self-pay | Attending: Thoracic Surgery (Cardiothoracic Vascular Surgery) | Admitting: Thoracic Surgery (Cardiothoracic Vascular Surgery)

## 2024-04-20 ENCOUNTER — Ambulatory Visit (HOSPITAL_COMMUNITY)
Admission: RE | Admit: 2024-04-20 | Discharge: 2024-04-20 | Disposition: A | Discharge status: Home / Self Care | Source: Ambulatory Visit | Attending: Internal Medicine | Admitting: Internal Medicine

## 2024-04-20 ENCOUNTER — Encounter: Payer: Self-pay | Admitting: Oncology

## 2024-04-20 ENCOUNTER — Inpatient Hospital Stay: Attending: Oncology | Admitting: Oncology

## 2024-04-20 ENCOUNTER — Encounter: Payer: Self-pay | Admitting: *Deleted

## 2024-04-20 VITALS — BP 131/80 | HR 61 | Temp 99.0°F | Resp 18 | Wt 215.0 lb

## 2024-04-20 VITALS — BP 140/84 | HR 90 | Resp 20 | Wt 215.0 lb

## 2024-04-20 DIAGNOSIS — C3492 Malignant neoplasm of unspecified part of left bronchus or lung: Secondary | ICD-10-CM | POA: Diagnosis not present

## 2024-04-20 DIAGNOSIS — Z902 Acquired absence of lung [part of]: Secondary | ICD-10-CM | POA: Diagnosis not present

## 2024-04-20 DIAGNOSIS — Z09 Encounter for follow-up examination after completed treatment for conditions other than malignant neoplasm: Secondary | ICD-10-CM

## 2024-04-20 DIAGNOSIS — J90 Pleural effusion, not elsewhere classified: Secondary | ICD-10-CM | POA: Insufficient documentation

## 2024-04-20 DIAGNOSIS — J9811 Atelectasis: Secondary | ICD-10-CM | POA: Insufficient documentation

## 2024-04-20 DIAGNOSIS — C3432 Malignant neoplasm of lower lobe, left bronchus or lung: Secondary | ICD-10-CM | POA: Diagnosis present

## 2024-04-20 NOTE — Progress Notes (Signed)
 Hematology/Oncology Progress note [phone] [phone]      Patient Care Team: Edman Marsa PARAS, DO as PCP - General (Family Medicine) Babara Call, MD as Consulting Physician (Oncology) Tamea Dedra CROME, MD as Consulting Physician (Pulmonary Disease) Verdene Gills, RN as Oncology Nurse Navigator  REFERRING PROVIDER: Edman Marsa * CHIEF COMPLAINTS/REASON FOR VISIT:  Stage IB left lung mucinous adenocarcinoma   ASSESSMENT & PLAN:   Cancer Staging  Adenocarcinoma of left lung [facility]) Staging form: Lung, AJCC V9 - Pathologic stage from 04/20/2024: pT2, pN0, cM0 - Signed by Babara Call, MD on 04/20/2024   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 Status post left lower lobectomy She is recovering from recent surgery, clinically doing well. I have sent NGS on surgical specimen. If she has EGFR mutation, consider adjuvant TKI.  If not recommend surveillance.   No orders of the defined types were placed in this encounter.  Follow-up to be determined  We spent sufficient time to discuss many aspect of care, questions were answered to patient's satisfaction. A total of 25 minutes was spent on this visit.  With 5 minutes spent reviewing image findings, pathology reports, 15 minutes counseling the patient on the diagnosis, postoperative surveillance plan. additional 5 minutes was spent on answering patient's questions.  All questions were answered. The patient knows to call the clinic with any problems, questions or concerns.  Call Babara, MD, PhD [facility] Hematology Oncology 04/20/2024     INTERVAL HISTORY Amanda Pierce is a 68 y.o. female who has above history reviewed by me today presents to establish care for lung mass evaluation. Patient has a history of iron  deficiency anemia and was previously seen by me.  Oncology History  Adenocarcinoma of left lung (HCC)  01/20/2024 Initial Diagnosis    Adenocarcinoma of left lung (HCC)  S/p biopsy via bronchoscopy. 1. Bronchial Brushing, LLL - NEGATIVE FOR MALIGNANCY. - REACTIVE BRONCHIAL CELLS. 2. Lung, Fine Needle Aspiration, left lower lobe, transbronchial - POSITIVE FOR MALIGNANCY. - NON-SMALL CELL CARCINOMA IS PRESENT.   01/26/2024 Imaging   PET showed  Cavitary spiculated left lower lobe lung nodule has low level abnormal radiotracer uptake. Based on the appearance as well as the low level abnormal uptake, a malignant lesion is in the differential.   No additional lung or areas of nodal abnormal uptake.   Asymmetrically enlarged right thyroid lobe with diffuse abnormal radiotracer uptake. An aggressive thyroid lesion is possible. Please correlate for known history or dedicated thyroid ultrasound as the next step in the workup when able.     04/02/2024 Surgery   Status post  left lobectomy  A. LUNG, LEFT LOWER LOBE, LOBECTOMY: - Mucinous adenocarcinoma of the lung with, 3.2 cm - Visceral pleura is not involved - Resection margin is negative for carcinoma - Negative for lymphovascular invasion - Five lymph nodes, negative for carcinoma (0/5) - See oncology table B. LYMPH NODE, 9, EXCISION: - Lymph node, negative for carcinoma (0/1) C. LYMPH NODE, 7, EXCISION: - Lymph node, negative for carcinoma (0/1) D. LYMPH NODE, 8, EXCISION: - Lymph node, negative for carcinoma (0/1) E. LYMPH NODE, 10, EXCISION: - Lymph node, negative for carcinoma (0/1) F. LYMPH NODE, 12, EXCISION: - Lymph node, negative for carcinoma (0/1) G. LYMPH NODE, 12 #2, EXCISION: - Lymph node, negative for carcinoma (0/1) H. LYMPH NODE, 5, EXCISION: - Lymph node, negative for carcinoma (0/1) I. LYMPH NODE, 11, EXCISION: - Lymph node, negative for carcinoma (0/1) J. LYMPH NODE,  12 #3, EXCISION: - Lymph node, negative for carcinoma (0/1) K. LYMPH NODE, 13, EXCISION: - Lymph node, negative for carcinoma (0/1) L. LYMPH NODE, 12 #4, EXCISION: -  Lymph node, negative for carcinoma (0/1) M. LYMPH NODE, 11 #2, EXCISION: - Lymph node, negative for carcinoma (0/1)  Synchronous Tumors: Not applicable Total Number of Primary Tumors: 1 Procedure: Lobectomy, lung Specimen Laterality: Left Tumor Focality: Unifocal Tumor Site: Lower lobe Tumor Size: 3.2 cm Histologic Type: Mucinous adenocarcinoma Visceral Pleura Invasion: Not identified Direct Invasion of Adjacent Structures: No adjacent structures present Lymphovascular Invasion: Not identified Margins: All margins negative for invasive carcinoma Closest Margin(s) to Invasive Carcinoma: Bronchial margin Margin(s) Involved by Invasive Carcinoma: Not applicable Margin Status for Non-Invasive Tumor: Not applicable Treatment Effect: No known presurgical therapy Regional Lymph Nodes: Number of Lymph Nodes Involved: 0 Nodal Sites with Tumor: Not applicable Number of Lymph Nodes Examined: 17 Nodal Sites Examined: Levels 5, 7, 8, 10, 11, 12 and 13 Distant Metastasis: Distant Site(s) Involved: Not applicable Pathologic Stage Classification (pTNM, AJCC 8th Edition): pT2a, pN0   04/20/2024 Cancer Staging   Staging form: Lung, AJCC V9 - Pathologic stage from 04/20/2024: pT2, pN0, cM0 - Signed by Babara Call, MD on 04/20/2024 Stage prefix: Initial diagnosis Residual tumor (R): R0   Patient is a never smoker. Today she presents for postoperation follow-up.  She tolerated surgery well.  She has some pain at her surgical sites.  Overall she is doing well clinically.   Review of Systems  Constitutional:  Negative for appetite change, chills, fatigue and fever.  HENT:   Negative for hearing loss and voice change.   Eyes:  Negative for eye problems.  Respiratory:  Negative for chest tightness and cough.   Cardiovascular:  Negative for chest pain.  Gastrointestinal:  Negative for abdominal distention, abdominal pain, blood in stool and constipation.  Endocrine: Negative for hot flashes.   Genitourinary:  Negative for difficulty urinating and frequency.   Musculoskeletal:  Negative for arthralgias.  Skin:  Negative for itching and rash.  Neurological:  Negative for extremity weakness.  Hematological:  Negative for adenopathy.  Psychiatric/Behavioral:  Negative for confusion.     MEDICAL HISTORY:  Past Medical History:  Diagnosis Date   Allergy    Anemia    Asthma    Cancer (HCC) 2025   Non Small Cell Lung Cancer - Left   GERD (gastroesophageal reflux disease)    History of hiatal hernia    Hypothyroidism    Iron  deficiency anemia    Left lower lobe pulmonary nodule 12/2023   Osteopenia    Pneumonia    Pre-diabetes    Pure hypercholesterolemia    Suspected sleep apnea    Thyroid disease    hypothyroid    SURGICAL HISTORY: Past Surgical History:  Procedure Laterality Date   BREAST MASS EXCISION Left 1974   benign   BRONCHOSCOPY, WITH BIOPSY USING ELECTROMAGNETIC NAVIGATION Left 02/02/2024   Procedure: BRONCHOSCOPY, WITH BIOPSY USING ELECTROMAGNETIC NAVIGATION;  Surgeon: Tamea Dedra CROME, MD;  Location: [facility] ORS;  Service: Pulmonary;  Laterality: Left;   COLONOSCOPY WITH PROPOFOL  N/A 10/28/2019   Procedure: COLONOSCOPY WITH PROPOFOL ;  Surgeon: Janalyn Keene NOVAK, MD;  Location: [facility] ENDOSCOPY;  Service: Endoscopy;  Laterality: N/A;   ENDOSCOPIC MUCOSAL RESECTION N/A 11/22/2019   Procedure: ENDOSCOPIC MUCOSAL RESECTION;  Surgeon: Wilhelmenia Aloha Raddle., MD;  Location: [facility] ENDOSCOPY;  Service: Gastroenterology;  Laterality: N/A;   ESOPHAGOGASTRODUODENOSCOPY (EGD) WITH PROPOFOL  N/A 10/28/2019   Procedure: ESOPHAGOGASTRODUODENOSCOPY (EGD) WITH PROPOFOL ;  Surgeon: Janalyn Keene NOVAK, MD;  Location: [facility] ENDOSCOPY;  Service: Endoscopy;  Laterality: N/A;   ESOPHAGOGASTRODUODENOSCOPY (EGD) WITH PROPOFOL  N/A 11/22/2019   Procedure: ESOPHAGOGASTRODUODENOSCOPY (EGD) WITH PROPOFOL ;  Surgeon: Wilhelmenia Aloha Raddle., MD;  Location: [facility] ENDOSCOPY;  Service:  Gastroenterology;  Laterality: N/A;   HEMOSTASIS CLIP PLACEMENT  11/22/2019   Procedure: HEMOSTASIS CLIP PLACEMENT;  Surgeon: Wilhelmenia Aloha Raddle., MD;  Location: [facility] ENDOSCOPY;  Service: Gastroenterology;;   HEMOSTASIS CONTROL  11/22/2019   Procedure: HEMOSTASIS CONTROL;  Surgeon: Wilhelmenia Aloha Raddle., MD;  Location: [facility] ENDOSCOPY;  Service: Gastroenterology;;  epi    INTERCOSTAL NERVE BLOCK Left 04/02/2024   Procedure: BLOCK, NERVE, INTERCOSTAL;  Surgeon: Kerrin Elspeth BROCKS, MD;  Location: [facility] OR;  Service: Thoracic;  Laterality: Left;   LOBECTOMY, LUNG, ROBOT-ASSISTED, USING VATS Left 04/02/2024   Procedure: LOBECTOMY, LUNG, ROBOT-ASSISTED, USING VATS;  Surgeon: Kerrin Elspeth BROCKS, MD;  Location: [facility] OR;  Service: Thoracic;  Laterality: Left;  ROBOTIC LEFT LOWER LOBECTOMY   SENTINEL NODE BIOPSY  04/02/2024   Procedure: BIOPSY, LYMPH NODE;  Surgeon: Kerrin Elspeth BROCKS, MD;  Location: [facility] OR;  Service: Thoracic;;   SUBMUCOSAL LIFTING INJECTION  11/22/2019   Procedure: SUBMUCOSAL LIFTING INJECTION;  Surgeon: Wilhelmenia Aloha Raddle., MD;  Location: [facility] ENDOSCOPY;  Service: Gastroenterology;;   THYROIDECTOMY, PARTIAL Left 1982   TUBAL LIGATION     VIDEO BRONCHOSCOPY WITH ENDOBRONCHIAL ULTRASOUND Left 02/02/2024   Procedure: BRONCHOSCOPY, WITH EBUS;  Surgeon: Tamea Dedra CROME, MD;  Location: [facility] ORS;  Service: Pulmonary;  Laterality: Left;   WISDOM TOOTH EXTRACTION      SOCIAL HISTORY: Social History   Socioeconomic History   Marital status: Widowed    Spouse name: Not on file   Number of children: 4   Years of education: Not on file   Highest education level: Not on file  Occupational History   Occupation: retired  Tobacco Use   Smoking status: Never   Smokeless tobacco: Never  Vaping Use   Vaping status: Never Used  Substance and Sexual Activity   Alcohol use: Never   Drug use: Never   Sexual activity: Not Currently  Other Topics Concern   Not on file  Social History  Narrative   Lives alone   Social Drivers of Health   Financial Resource Strain: Medium Risk (07/30/2022)   Overall Financial Resource Strain (CARDIA)    Difficulty of Paying Living Expenses: Somewhat hard  Food Insecurity: No Food Insecurity (04/02/2024)   Hunger Vital Sign    Worried About Running Out of Food in the Last Year: Never true    Ran Out of Food in the Last Year: Never true  Transportation Needs: No Transportation Needs (04/02/2024)   PRAPARE - Administrator, Civil Service (Medical): No    Lack of Transportation (Non-Medical): No  Physical Activity: Inactive (04/03/2021)   Exercise Vital Sign    Days of Exercise per Week: 0 days    Minutes of Exercise per Session: 0 min  Stress: Stress Concern Present (04/03/2021)   [employer] of Occupational Health - Occupational Stress Questionnaire    Feeling of Stress : To some extent  Social Connections: Unknown (04/02/2024)   Social Connection and Isolation Panel    Frequency of Communication with Friends and Family: More than three times a week    Frequency of Social Gatherings with Friends and Family: More than three times a week    Attends Religious Services: Not on Insurance claims handler of [employer]  or Organizations: Not on file    Attends Club or Organization Meetings: Not on file    Marital Status: Not on file  Intimate Partner Violence: Not At Risk (04/02/2024)   Humiliation, Afraid, Rape, and Kick questionnaire    Fear of Current or Ex-Partner: No    Emotionally Abused: No    Physically Abused: No    Sexually Abused: No    FAMILY HISTORY: Family History  Problem Relation Age of Onset   Cancer Mother        Breast, Paget's removed nipple 48, breast 2001   Colon polyps Mother    Paget's disease of bone Mother    Hypertension Mother    Hyperlipidemia Mother    Heart disease Father    Heart attack Father    Heart disease Maternal Grandfather    Stroke Paternal Grandmother    Colon cancer Neg Hx     Esophageal cancer Neg Hx    Inflammatory bowel disease Neg Hx    Liver disease Neg Hx    Pancreatic cancer Neg Hx    Rectal cancer Neg Hx    Stomach cancer Neg Hx     ALLERGIES:  is allergic to aspirin, contrast media [iodinated contrast media], elemental sulfur, ibuprofen, latex, penicillins, rubbing alcohol [alcohol], shellfish allergy, adhesive [tape], medical adhesive remover, and sulfa antibiotics.  MEDICATIONS:  Current Outpatient Medications  Medication Sig Dispense Refill   acetaminophen  (TYLENOL ) 500 MG tablet Take 500-1,000 mg by mouth every 6 (six) hours as needed (pain.).     Cholecalciferol (VITAMIN D3) 125 MCG (5000 UT) CAPS Take 1 capsule (5,000 Units total) by mouth daily. For 8 weeks, then start Vitamin D3 2,000 units daily (OTC) (Patient taking differently: Take 5,000 Units by mouth daily.) 90 capsule    fexofenadine (ALLEGRA) 180 MG tablet Take 180 mg by mouth daily as needed for allergies or rhinitis.     fluticasone  (FLONASE ) 50 MCG/ACT nasal spray PLACE 2 SPRAYS INTO BOTH NOSTRILS DAILY. USE FOR 4-6 WEEKS THEN STOP AND USE SEASONALLY OR AS NEEDED (Patient taking differently: Place 2 sprays into both nostrils daily as needed for allergies.) 48 mL 0   gabapentin  (NEURONTIN ) 300 MG capsule Take 1 capsule (300 mg total) by mouth 2 (two) times daily. 60 capsule 1   montelukast  (SINGULAIR ) 10 MG tablet TAKE 1 TABLET BY MOUTH EVERYDAY AT BEDTIME (Patient taking differently: Take 10 mg by mouth daily as needed (allergies.).) 30 tablet 0   nystatin cream (MYCOSTATIN) Apply 1 application  topically 2 (two) times daily as needed (skin irritation.).     oxyCODONE  (OXY IR/ROXICODONE ) 5 MG immediate release tablet Take 1 tablet (5 mg total) by mouth every 4 (four) hours as needed for moderate pain (pain score 4-6). 30 tablet 0   SYNTHROID  125 MCG tablet Take 125 mcg by mouth daily before breakfast.     triamcinolone  cream (KENALOG ) 0.1 % Apply 1 Application topically 2 (two) times daily  as needed (skin irritation.).     No current facility-administered medications for this visit.     PHYSICAL EXAMINATION: ECOG PERFORMANCE STATUS: 0 - Asymptomatic   Vitals:   04/20/24 1345  BP: 131/80  Pulse: 61  Resp: 18  Temp: 99 F (37.2 C)  SpO2: 99%   Filed Weights   04/20/24 1345  Weight: 215 lb (97.5 kg)    Physical Exam Constitutional:      General: She is not in acute distress. HENT:     Head: Normocephalic and atraumatic.  Eyes:     General: No scleral icterus.    Pupils: Pupils are equal, round, and reactive to light.    Cardiovascular:     Rate and Rhythm: Normal rate and regular rhythm.     Heart sounds: Normal heart sounds.  Pulmonary:     Effort: Pulmonary effort is normal. No respiratory distress.     Breath sounds: No wheezing.     Comments: Absent breath sound left lower lobe Abdominal:     General: Bowel sounds are normal. There is no distension.     Palpations: Abdomen is soft. There is no mass.     Tenderness: There is no abdominal tenderness.   Musculoskeletal:        General: No deformity. Normal range of motion.     Cervical back: Normal range of motion and neck supple.   Skin:    General: Skin is warm and dry.     Findings: No erythema or rash.   Neurological:     Mental Status: She is alert and oriented to person, place, and time. Mental status is at baseline.     Cranial Nerves: No cranial nerve deficit.     Coordination: Coordination normal.   Psychiatric:        Mood and Affect: Mood normal.        Behavior: Behavior normal.        Thought Content: Thought content normal.      LABORATORY DATA:  I have reviewed the data as listed Lab Results  Component Value Date   WBC 5.6 04/04/2024   HGB 11.4 (L) 04/04/2024   HCT 35.8 (L) 04/04/2024   MCV 88.2 04/04/2024   PLT 237 04/04/2024   Recent Labs    01/19/24 1454 03/31/24 1200 04/03/24 0450 04/04/24 0510  NA 136 139 135 135  K 4.0 4.4 4.2 4.1  CL 104 105 103  105  CO2 24 26 24 25   GLUCOSE 85 109* 127* 101*  BUN 16 12 14 10   CREATININE 0.83 0.84 0.96 0.87  CALCIUM 8.7* 9.0 8.2* 8.3*  GFRNONAA >60 >60 >60 >60  PROT 7.7 7.1  --  6.1*  ALBUMIN  3.9 3.4*  --  2.9*  AST 22 22  --  26  ALT 18 17  --  14  ALKPHOS 72 61  --  45  BILITOT 0.5 0.8  --  0.8   Iron /TIBC/Ferritin/ %Sat    Component Value Date/Time   IRON  55 12/10/2021 1412   TIBC 337 12/10/2021 1412   FERRITIN 25 12/10/2021 1412   IRONPCTSAT 16 12/10/2021 1412   IRONPCTSAT 9 (L) 10/11/2019 0940     DG Chest 2 View Result Date: 04/20/2024 CLINICAL DATA:  Left lobectomy. EXAM: CHEST - 2 VIEW COMPARISON:  April 05, 2024 FINDINGS: Stable cardiomediastinal silhouette. Right lung is clear. Small left pleural effusion is noted with minimal left basilar atelectasis. Bony thorax is unremarkable. IMPRESSION: Small left pleural effusion with minimal left basilar atelectasis. Electronically Signed   By: Lynwood Landy Raddle M.D.   On: 04/20/2024 09:25   DG Chest 2 View Result Date: 04/05/2024 CLINICAL DATA:  Pneumothorax EXAM: CHEST - 2 VIEW COMPARISON:  Chest x-ray performed April 04, 2024 FINDINGS: Small left apical pneumothorax, stable. Right-sided approach catheter tubing with tip overlying the SVC. Heart mediastinum are not significantly changed. Modest improvement in basilar aeration. IMPRESSION: 1. Stable appearance of small left apical pneumothorax. Electronically Signed   By: Maude Naegeli M.D.   On: 04/05/2024 08:23  DG CHEST PORT 1 VIEW Result Date: 04/04/2024 CLINICAL DATA:  Postop evaluation. EXAM: PORTABLE CHEST 1 VIEW COMPARISON:  Chest radiograph dated 04/03/2024. FINDINGS: Right IJ catheter with tip at the cavoatrial junction. There is left lung base atelectasis or infiltrate. Trace left pleural effusion may be present. There is a small left apical pneumothorax. There has been interval removal of the left chest tube. The right lung is clear. Stable cardiac silhouette no acute osseous pathology.  Left chest wall emphysema. IMPRESSION: 1. Small left apical pneumothorax status post chest tube removal. 2. Left lung base atelectasis or infiltrate. These results will be called to the ordering clinician or representative by the Radiologist Assistant, and communication documented in the PACS or [employer]. Electronically Signed   By: Vanetta Chou M.D.   On: 04/04/2024 08:40   DG Chest Port 1 View Result Date: 04/03/2024 CLINICAL DATA:  68 year old female status post left lower lobectomy for non-small cell carcinoma postoperative day 1. EXAM: PORTABLE CHEST 1 VIEW COMPARISON:  Portable chest yesterday and earlier. FINDINGS: Portable AP semi upright view at 0541 hours. Stable left chest tube tracking medial and to the lung apex. No pneumothorax identified. Asymmetric left lung volume loss is stable. Mediastinal contours are stable. Stable right IJ approach central line. Right lung appears stable and negative. No pleural effusion identified. Stable visualized osseous structures.  Paucity of bowel gas. IMPRESSION: 1.  Stable lines and tubes. 2. No pneumothorax or adverse features status post left lower lobectomy. Electronically Signed   By: VEAR Hurst M.D.   On: 04/03/2024 08:41   DG Chest Port 1 View Result Date: 04/02/2024 CLINICAL DATA:  Status post lobectomy. EXAM: PORTABLE CHEST 1 VIEW COMPARISON:  March 31, 2024. FINDINGS: Stable cardiomediastinal silhouette. Right internal jugular catheter is noted with distal tip in expected position of the SVC. Left-sided chest tube is noted with probable minimal left apical pneumothorax. Right lung is clear. IMPRESSION: Left-sided chest tube is noted with probable minimal left apical pneumothorax. Electronically Signed   By: Lynwood Landy Raddle M.D.   On: 04/02/2024 12:22   DG Chest 2 View Result Date: 03/31/2024 CLINICAL DATA:  Preop. Non-small cell cancer of left lung. Upcoming left lung surgery. EXAM: CHEST - 2 VIEW COMPARISON:  Radiograph 02/09/2024.  CT 01/30/2024  FINDINGS: Normal heart size with stable mediastinal contours. Aortic tortuosity and atherosclerosis. Unchanged left lower lobe pulmonary nodule. No acute airspace disease, pulmonary edema, pleural effusion or pneumothorax. No acute osseous abnormalities. IMPRESSION: 1. No acute chest findings. 2. Unchanged left lower lobe pulmonary nodule. Electronically Signed   By: Andrea Gasman M.D.   On: 03/31/2024 15:29

## 2024-04-20 NOTE — Progress Notes (Addendum)
 Tempus xT, xR, and PDL1 ordered on recent lung resection sample [phone]. Financial assistance application completed and approved for $0 out of pocket cost.

## 2024-04-20 NOTE — Progress Notes (Signed)
 [address], [place] [address]             [phone]     HPI: Ms. Amanda Pierce returns for follow-up after recent left lower lobectomy.  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  She is a lifelong non-smoker.  Found to have a nodule on a CT for coronary calcium screening.  Robotic bronchoscopy showed non-small cell carcinoma.  She underwent a robotic assisted left lower lobectomy on 04/02/2024.  Final pathology showed a stage Ib (T2a, N0) adenocarcinoma.  She has not had any respiratory issues.  She does have some incisional pain.  She is taking gabapentin  and Tylenol .  She cannot take nonsteroidals.  Using oxycodone  about twice a day.  Past Medical History:  Diagnosis Date   Allergy    Anemia    Asthma    Cancer (HCC) 2025   Non Small Cell Lung Cancer - Left   GERD (gastroesophageal reflux disease)    History of hiatal hernia    Hypothyroidism    Iron  deficiency anemia    Left lower lobe pulmonary nodule 12/2023   Osteopenia    Pneumonia    Pre-diabetes    Pure hypercholesterolemia    Suspected sleep apnea    Thyroid disease    hypothyroid    Current Outpatient Medications  Medication Sig Dispense Refill   acetaminophen  (TYLENOL ) 500 MG tablet Take 500-1,000 mg by mouth every 6 (six) hours as needed (pain.).     Cholecalciferol (VITAMIN D3) 125 MCG (5000 UT) CAPS Take 1 capsule (5,000 Units total) by mouth daily. For 8 weeks, then start Vitamin D3 2,000 units daily (OTC) (Patient taking differently: Take 5,000 Units by mouth daily.) 90 capsule    fexofenadine (ALLEGRA) 180 MG tablet Take 180 mg by mouth daily as needed for allergies or rhinitis.     fluticasone  (FLONASE ) 50 MCG/ACT nasal spray PLACE 2 SPRAYS INTO BOTH NOSTRILS DAILY. USE FOR 4-6  WEEKS THEN STOP AND USE SEASONALLY OR AS NEEDED (Patient taking differently: Place 2 sprays into both nostrils daily as needed for allergies.) 48 mL 0   gabapentin  (NEURONTIN ) 300 MG capsule Take 1 capsule (300 mg total) by mouth 2 (two) times daily. 60 capsule 1   montelukast  (SINGULAIR ) 10 MG tablet TAKE 1 TABLET BY MOUTH EVERYDAY AT BEDTIME (Patient taking differently: Take 10 mg by mouth daily as needed (allergies.).) 30 tablet 0   nystatin cream (MYCOSTATIN) Apply 1 application  topically 2 (two) times daily as needed (skin irritation.).     oxyCODONE  (OXY IR/ROXICODONE ) 5 MG immediate release tablet Take 1 tablet (5 mg total) by mouth every 4 (four) hours as needed for moderate pain (pain score 4-6). 30 tablet 0   SYNTHROID  125 MCG tablet Take 125 mcg by mouth daily before breakfast.     triamcinolone  cream (KENALOG ) 0.1 % Apply 1 Application topically 2 (two) times daily as needed (skin irritation.).     No current facility-administered medications for this visit.    Physical Exam BP (!) 140/84   Pulse 90   Resp 20   Wt 215 lb (97.5 kg)   LMP  (LMP Unknown)   SpO2 99% Comment: RA  BMI 32.53 kg/m  68 year old woman in no acute distress Alert and oriented x 3 with no focal deficits Incisions healing  well Diminished breath sounds at left base otherwise clear Cardiac regular rate and rhythm No peripheral edema   Diagnostic Tests: I personally reviewed her chest x-ray.  Postop changes from left lower lobectomy.  Impression: Amanda Pierce is a 68 year old non-smoker who was incidentally found to have a lung nodule on a CT for coronary calcium screening.  She underwent a robotic assisted left lower lobectomy.  Final pathology showed a stage Ib (T2a, N0) adenocarcinoma.  Stage Ib adenocarcinoma-no indication for chemotherapy is less than 4 cm.  Might be a candidate for targeted therapy depending on molecular testing.  She has an appointment with Dr. Babara at [facility] today.  Can discuss  further with her.  Status post left lower lobectomy-she is doing well.  She does have some pain which is to be expected 2-1/2 weeks after surgery.  Using oxycodone  about twice a day.  She will call if she needs a refill on that.  Still using gabapentin  and Tylenol  as well.  Unfortunately cannot use nonsteroidals.  She may drive on a limited basis.  Appropriate precautions were discussed.  Can resume activities as tolerated.  Plan: See Dr. CINDERELLA tomorrow I will see her back in 6 weeks to check on her progressu  [name], MD [facility] [phone]

## 2024-04-20 NOTE — Assessment & Plan Note (Addendum)
 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  Tumor size less than 4 cm. Status post left lower lobectomy, negative surgical margin. She is recovering from recent surgery, clinically doing well. I have sent NGS on surgical specimen. If patient has EGFR mutation, consider adjuvant osimertinib.  Otherwise surveillance. recommend surveillance CT chest every 6 months.

## 2024-04-20 NOTE — Progress Notes (Signed)
 Met with patient during follow up visit with Dr. Babara. All questions answered during visit. Informed will call with results from NGS and to schedule further follow up based on results. Contact info given and instructed to call with any questions or needs. Pt verbalized understanding.

## 2024-04-23 ENCOUNTER — Other Ambulatory Visit: Payer: Self-pay | Admitting: Physician Assistant

## 2024-04-23 MED ORDER — OXYCODONE HCL 5 MG PO TABS: 5.0000 mg | ORAL_TABLET | Freq: Four times a day (QID) | ORAL | 0 refills | Status: DC | PRN

## 2024-04-23 NOTE — Progress Notes (Signed)
      [address] [place] [address]             [phone]      Patient reports she is still taking about 2 Oxycodone  per day and has been having a little more pain since she is doing more now that her family has left. I will refill her Oxycodone  and send to [facility] in [place], [place] as requested.  Con GORMAN [place], PA-C

## 2024-05-03 ENCOUNTER — Encounter: Payer: Self-pay | Admitting: Oncology

## 2024-05-06 ENCOUNTER — Encounter: Payer: Self-pay | Admitting: *Deleted

## 2024-05-06 ENCOUNTER — Other Ambulatory Visit: Payer: Self-pay | Admitting: Oncology

## 2024-05-06 ENCOUNTER — Other Ambulatory Visit: Payer: Self-pay

## 2024-05-06 ENCOUNTER — Telehealth: Payer: Self-pay

## 2024-05-06 DIAGNOSIS — C3492 Malignant neoplasm of unspecified part of left bronchus or lung: Secondary | ICD-10-CM

## 2024-05-06 MED ORDER — PREDNISONE 50 MG PO TABS: 50.0000 mg | ORAL_TABLET | ORAL | 0 refills | Status: DC

## 2024-05-06 NOTE — Telephone Encounter (Signed)
 Pt has been scheduled for CT and follow up. Letter has been sent with appt details and instructions on premeds prior to CT scan.

## 2024-05-06 NOTE — Telephone Encounter (Signed)
-----   Message from Nurse [facility] R sent at 05/06/2024 10:13 AM EDT ----- Amanda Pierce has been made aware. CT scan ordered.   Amanda Pierce- can you help schedule Amanda Pierce for CT scan in 6 months and follow up with Dr. Babara a few days after scan? She is aware and expecting your call.   Thanks! -Sid ----- Message ----- From: Babara Call, MD Sent: 05/05/2024   3:39 PM EDT To: Almarie JINNY Nett, RN; Fonda KANDICE Sax, CMA#  I have reviewed Amanda Pierce NGS.  Recommend surveillance. Please arrange Amanda Pierce to get CT chest w contrast in 6 months prior to MD thanks.   zy

## 2024-05-06 NOTE — Progress Notes (Signed)
 Called pt and reviewed results from NGS. Informed that Dr. Babara recommends surveillance at this time with another CT scan with contrast in 6 months. Pt informed that she will be called with her appt for CT scan and follow up. Nothing further needed at this time. Instructed to call with any questions or needs. Pt verbalized understanding.

## 2024-05-17 ENCOUNTER — Ambulatory Visit: Payer: Self-pay

## 2024-05-17 NOTE — Telephone Encounter (Signed)
 FYI Only or Action Required?: FYI only for provider.  Patient was last seen in primary care on 12/10/2023 by Edman Marsa PARAS, DO.  Called Nurse Triage reporting Shortness of Breath.  Symptoms began several weeks ago.  Interventions attempted: Prescription medications: gabapentin .  Symptoms are: unchanged.  Triage Disposition: Call Specialist When Office Open, Call PCP Now  Patient/caregiver understands and will follow disposition?: Yes     Copied from CRM [id number]. Topic: Clinical - Red Word Triage >> May 17, 2024  4:31 PM Rozanna MATSU wrote: [facility] that prompted transfer to Nurse Triage: STOMACH BURNING, PAIN IN LUNGS ETC, STATED SHE IS FEELING WORSE AFTER SURGERY Reason for Disposition  [1] Caller has URGENT question AND [2] triager unable to answer question  Answer Assessment - Initial Assessment Questions E2C2 Pulmonary Triage - Initial Assessment Questions Chief Complaint (e.g., cough, sob, wheezing, fever, chills, sweat or additional symptoms) *Go to specific symptom protocol after initial questions. Pain in lungs and stomach burns - endorses pain at site of lobectomy Currently on gabapentin  for nerve relief that may be keeping her up at night   How long have symptoms been present? 6 weeks - worsening x 1 week  Have you tested for COVID or Flu? Note: If not, ask patient if a home test can be taken. If so, instruct patient to call back for positive results. No  MEDICINES:   Have you used any OTC meds to help with symptoms? No If yes, ask What medications? N/a  Have you used your inhalers/maintenance medication? No If yes, What medications? N/a  If inhaler, ask How many puffs and how often? Note: Review instructions on medication in the chart. N/a  OXYGEN: Do you wear supplemental oxygen? No If yes, How many liters are you supposed to use? N/a  Do you monitor your oxygen levels? No If yes, What is your reading (oxygen level)  today? N/a  What is your usual oxygen saturation reading?  (Note: Pulmonary O2 sats should be 90% or greater) N/a      1. SYMPTOM: What's the main symptom you're concerned about? (e.g., pain, fever, vomiting)     Stomach burning Pain at incision site s/p lobectomy 2. ONSET: When did sx  start?     6 weeks 3. SURGERY: What surgery did you have?     lobectomy 4. DATE of SURGERY: When was the surgery?      04/02/2024 5. ANESTHESIA: What type of anesthesia did you have? (e.g., general, spinal, epidural, local)     See chart 6. DRAINS: Were any drains place in or around the wound? (e.g., Hemovac, Jackson-[name], Penrose)     N/a 7. PAIN: Is there any pain? If Yes, ask: How bad is it?  (Scale 0-10; or none, mild, moderate, severe)     yes 8. FEVER: Do you have a fever? If Yes, ask: What is your temperature, how was it measured, and when did it start?     denies 9. VOMITING: Is there any vomiting? If Yes, ask: How many times?     denies 10. BLEEDING: Is there any bleeding? If Yes, ask: How much? and Where?       denies 11. OTHER SYMPTOMS: Do you have any other symptoms? (e.g., drainage from wound, painful urination, constipation)       Abd burning 12. PREGNANCY: Is there any chance you are pregnant? When was your last menstrual  period?       N/a    Pt s/p lobectomy 6 weeks  ago and has been having ongoing incision pain and stomach burning. Triager unable to reschedule LBPU F/U appt. Triager advised pt to F/U with surgeon to see if F/U appt can be scheduled sooner. Patient verbalized understanding and to call back with worsening symptoms.  Protocols used: Post-Op Symptoms and Questions-A-AH

## 2024-05-18 NOTE — Telephone Encounter (Signed)
 It looks like just FYI to me. I would suggest she would need office visit follow-up to discuss symptom concerns if this is persistent. Otherwise, it sounds like she was already recommended to see Pulmonology or surgery follow-up after her procedure to discuss post op symptoms likely present.  Marsa Officer, DO [facility] 05/18/2024, 12:22 PM

## 2024-05-24 ENCOUNTER — Other Ambulatory Visit: Payer: Self-pay | Admitting: *Deleted

## 2024-05-24 DIAGNOSIS — Z902 Acquired absence of lung [part of]: Secondary | ICD-10-CM

## 2024-05-25 ENCOUNTER — Ambulatory Visit
Admission: RE | Admit: 2024-05-25 | Discharge: 2024-05-25 | Disposition: A | Discharge status: Home / Self Care | Source: Ambulatory Visit | Attending: Cardiology | Admitting: Cardiology

## 2024-05-25 ENCOUNTER — Ambulatory Visit (INDEPENDENT_AMBULATORY_CARE_PROVIDER_SITE_OTHER): Admitting: Thoracic Surgery (Cardiothoracic Vascular Surgery)

## 2024-05-25 ENCOUNTER — Encounter: Payer: Self-pay | Admitting: Thoracic Surgery (Cardiothoracic Vascular Surgery)

## 2024-05-25 VITALS — BP 150/87 | HR 68 | Resp 18 | Ht 68.0 in | Wt 214.0 lb

## 2024-05-25 DIAGNOSIS — Z902 Acquired absence of lung [part of]: Secondary | ICD-10-CM | POA: Insufficient documentation

## 2024-05-25 DIAGNOSIS — Z09 Encounter for follow-up examination after completed treatment for conditions other than malignant neoplasm: Secondary | ICD-10-CM

## 2024-05-25 MED ORDER — OMEPRAZOLE MAGNESIUM 20 MG PO TBEC
20.0000 mg | DELAYED_RELEASE_TABLET | Freq: Every day | ORAL | 6 refills | Status: AC
Start: 2024-05-25 — End: ?

## 2024-05-25 MED ORDER — GABAPENTIN 300 MG PO CAPS: 300.0000 mg | ORAL_CAPSULE | Freq: Three times a day (TID) | ORAL | 1 refills | Status: DC

## 2024-05-25 NOTE — Progress Notes (Signed)
 [address], [place] [address]             [phone]     HPI: [name] returns for follow-up after left lower lobectomy.  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  She is a lifelong non-smoker.  Found to have a nodule on a CT for coronary calcium screening.  Robotic bronchoscopy showed non-small cell carcinoma.  Underwent robotic assisted left lower lobectomy on 04/02/2024.  Final pathology showed a stage Ib adenocarcinoma.  Continues to have paresthesias along the left costal margin and also complains of a sensation that her stomach is burning inside.  Does have a history of reflux and ulcers.  Past Medical History:  Diagnosis Date   Allergy    Anemia    Asthma    Cancer (HCC) 2025   Non Small Cell Lung Cancer - Left   GERD (gastroesophageal reflux disease)    History of hiatal hernia    Hypothyroidism    Iron  deficiency anemia    Left lower lobe pulmonary nodule 12/2023   Osteopenia    Pneumonia    Pre-diabetes    Pure hypercholesterolemia    Suspected sleep apnea    Thyroid disease    hypothyroid     Current Outpatient Medications  Medication Sig Dispense Refill   acetaminophen  (TYLENOL ) 500 MG tablet Take 500-1,000 mg by mouth every 6 (six) hours as needed (pain.).     Cholecalciferol (VITAMIN D3) 125 MCG (5000 UT) CAPS Take 1 capsule (5,000 Units total) by mouth daily. For 8 weeks, then start Vitamin D3 2,000 units daily (OTC) (Patient taking differently: Take 5,000 Units by mouth daily.) 90 capsule    fexofenadine (ALLEGRA) 180 MG tablet Take 180 mg by mouth daily as needed for allergies or rhinitis.     fluticasone  (FLONASE ) 50 MCG/ACT nasal spray PLACE 2 SPRAYS INTO BOTH NOSTRILS DAILY. USE FOR 4-6 WEEKS THEN STOP AND USE SEASONALLY OR  AS NEEDED (Patient taking differently: Place 2 sprays into both nostrils daily as needed for allergies.) 48 mL 0   montelukast  (SINGULAIR ) 10 MG tablet TAKE 1 TABLET BY MOUTH EVERYDAY AT BEDTIME (Patient taking differently: Take 10 mg by mouth daily as needed (allergies.).) 30 tablet 0   nystatin cream (MYCOSTATIN) Apply 1 application  topically 2 (two) times daily as needed (skin irritation.).     omeprazole  (PRILOSEC  OTC) 20 MG tablet Take 1 tablet (20 mg total) by mouth daily. 30 tablet 6   [START ON 10/25/2024] predniSONE  (DELTASONE ) 50 MG tablet Take 1 tablet (50 mg total) by mouth See admin instructions. Prednisone  - take 50 mg by mouth at 13 hours, 7 hours, and 1 hour before contrast media injection 3 tablet 0   SYNTHROID  125 MCG tablet Take 125 mcg by mouth daily before breakfast.     triamcinolone  cream (KENALOG ) 0.1 % Apply 1 Application topically 2 (two) times daily as needed (skin irritation.).     gabapentin  (NEURONTIN ) 300 MG capsule Take 1 capsule (300 mg total) by mouth 3 (three) times daily. 90 capsule 1   No current facility-administered medications for this visit.    Physical Exam BP (!) 150/87   Pulse 68   Resp 18   Ht 5' 8 (1.727 m)   Wt 214 lb (97.1 kg)   LMP  (  LMP Unknown)   SpO2 96%   BMI 32.38 kg/m  68 year old woman in no acute distress Alert and oriented x 3 with no focal deficits Lungs diminished at left base but otherwise clear Incisions well-healed  Diagnostic Tests: I personally reviewed the chest x-ray images.  Postoperative changes from lobectomy.  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  She is a lifelong non-smoker.  Stage Ib adenocarcinoma left lower lobe, status post left lower lobectomy-overall doing well.    She is off of  narcotics now but continues to have paresthesias.  Complains of stomach burning on the inside.  Hard to tell if this is paresthesias related to her incisions.  I suspect it is paresthesias, but she does have a history of reflux and ulcers.  She has been on omeprazole  in the past.  Will restart omeprazole  and increase her gabapentin  to 300 mg 3 times a day.   Plan: Omeprazole  20 mg daily Increase gabapentin  to 300 mg 3 times daily Return in 2 months to check on progress  [name], MD [facility] [phone]

## 2024-06-01 ENCOUNTER — Ambulatory Visit (INDEPENDENT_AMBULATORY_CARE_PROVIDER_SITE_OTHER): Admitting: Pulmonary Disease

## 2024-06-01 ENCOUNTER — Encounter: Payer: Self-pay | Admitting: Pulmonary Disease

## 2024-06-01 VITALS — BP 132/88 | HR 60 | Temp 98.8°F | Ht 68.0 in | Wt 215.6 lb

## 2024-06-01 DIAGNOSIS — R0602 Shortness of breath: Secondary | ICD-10-CM | POA: Diagnosis not present

## 2024-06-01 DIAGNOSIS — C3492 Malignant neoplasm of unspecified part of left bronchus or lung: Secondary | ICD-10-CM | POA: Diagnosis not present

## 2024-06-01 DIAGNOSIS — J4530 Mild persistent asthma, uncomplicated: Secondary | ICD-10-CM

## 2024-06-01 LAB — NITRIC OXIDE: Nitric Oxide: 26

## 2024-06-01 MED ORDER — FLUTICASONE FUROATE-VILANTEROL 100-25 MCG/ACT IN AEPB: 1.0000 | INHALATION_SPRAY | Freq: Every day | RESPIRATORY_TRACT | 5 refills | Status: AC

## 2024-06-01 MED ORDER — ALBUTEROL SULFATE HFA 108 (90 BASE) MCG/ACT IN AERS: 2.0000 | INHALATION_SPRAY | Freq: Four times a day (QID) | RESPIRATORY_TRACT | 2 refills | Status: DC | PRN

## 2024-06-01 NOTE — Patient Instructions (Signed)
 VISIT SUMMARY:  You are a 68 year old female who came in for a follow-up on your breathing issues. You have a history of adenocarcinoma of the lung and have undergone resection. You are currently experiencing pain when coughing and sneezing, which you described as nerve pain. You also mentioned needing a new EpiPen  due to your allergy to shellfish and other substances.  YOUR PLAN:  -ADENOCARCINOMA OF LUNG, STATUS POST RESECTION: Adenocarcinoma of the lung is a type of lung cancer that originates in the glandular cells of the lung. You have had surgery to remove the cancer, and continued monitoring is necessary to check for any recurrence or effects from environmental exposures. Please continue your follow-up appointments with Dr. Kerrin.  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 This pain can be triggered by actions like coughing or sneezing. We will continue to monitor and manage this pain.  -MILD PERSISTENT ASTHMA: Mild persistent asthma is a condition where the airways in your lungs are inflamed and can cause symptoms like coughing and sneezing. Your pre-surgery tests showed mild obstruction consistent with asthma. We will prescribe Breo for daily asthma management and provide an emergency inhaler for use as needed. We will also check the level of inflammation in your airway.  INSTRUCTIONS:  Please continue your follow-up appointments with Dr. Kerrin and Dr. Babara. Make sure to get a new EpiPen  for your shellfish allergy, contact your primary care this prescription. Use Breo daily for asthma management and keep your emergency inhaler with you for any sudden symptoms. We will also check the level of inflammation in your airway during your next visit.

## 2024-06-01 NOTE — Progress Notes (Signed)
 Subjective:    Patient ID: Amanda Pierce, female    DOB: 02-Feb-1956, 68 y.o.   MRN: [id number]  Patient Care Team: Edman Marsa PARAS, DO as PCP - General (Family Medicine) Babara Call, MD as Consulting Physician (Oncology) Tamea Dedra CROME, MD as Consulting Physician (Pulmonary Disease) Verdene Gills, RN as Oncology Nurse Navigator  Chief Complaint  Patient presents with   Follow-up    Dry cough. Shortness of breath on exertion and at rest.     BACKGROUND/INTERVAL:Patient is a 68 year old lifelong never smoker with a history of iron  deficiency anemia, who presents for follow-up of a lung nodule/mass noted on the left lower lobe.  This was incidentally found during cardiac CT. She underwent robotic assisted navigational bronchoscopy on 02 February 2024.  This was consistent with non-small cell carcinoma.  Subsequently she underwent robotic assisted left lower lobectomy by Dr. Kerrin on 02 April 2024 consistent with adenocarcinoma stage Ib.  HPI Discussed the use of AI scribe software for clinical note transcription with the patient, who gave verbal consent to proceed.  History of Present Illness   She is a 68 year old female with adenocarcinoma of the lung, status post resection, who presents for follow-up on shortness of breath and cough..  She experiences pain when coughing and sneezing, described as nerve pain that takes her breath away. She mentioned turning into the bed to try to get comfortable. She is uncertain if the sneezing is related to her medication or allergies.  She has a history of adenocarcinoma of the lung and has undergone resection. She is currently under follow-up care and mentions seeing Dr.Hendrickson last week and again in two months. She is also under the care of Dr. Babara for ongoing oncologic management.  Patient's tissue did not have any actionable mutations noted on NGS.  EGFR was negative.  Tumor was KRAS positive.  She inquires about obtaining a new EpiPen   due to her allergy to shellfish and other substances, as her current EpiPen  is expired.  She obtains this from primary care and recommend that she call her primary care office to fill this prescription.  She has had some cough productive of occasional whitish sputum.  She does have a history of asthma in the past and was treated with Advair and as needed albuterol  but she had discontinued these medications feeling that she did not need them.  She does not endorse any other symptomatology.     Review of Systems A 10 point review of systems was performed and it is as noted above otherwise negative.   Patient Active Problem List   Diagnosis Date Noted   S/P lobectomy of lung 04/02/2024   Non-small cell cancer of left lung (HCC) 02/04/2024   Adenocarcinoma of left lung (HCC) 01/20/2024   Sprain of shoulder and upper arm 05/09/2020   Impingement syndrome of shoulder region 05/09/2020   Stomach irritation    Gastric polyp    Special screening for malignant neoplasms, colon    Pain in joint of right shoulder 04/15/2019   Vitamin D  deficiency 01/04/2019   Elevated BP without diagnosis of hypertension 12/07/2018   Osteopenia of spine 09/17/2018   Hiatal hernia 08/28/2018   Obesity (BMI 30.0-34.9) 05/30/2018   Gastroesophageal reflux disease without esophagitis 05/30/2018   Pre-diabetes 05/30/2018   Pure hypercholesterolemia 05/30/2018   Hypothyroidism (acquired) 05/29/2018   Suspected sleep apnea 05/29/2018    Social History   Tobacco Use   Smoking status: Never   Smokeless tobacco: Never  Substance Use Topics   Alcohol use: Never    Allergies  Allergen Reactions   Aspirin Anaphylaxis   Contrast Media [Iodinated Contrast Media] Nausea And Vomiting and Palpitations   Elemental Sulfur Hives and Rash   Ibuprofen Anaphylaxis   Latex Rash    Any latex adhesive, rubber compounds:  examples: ekg leads,etc.   Penicillins Anaphylaxis    Did it involve swelling of the  face/tongue/throat, SOB, or low BP? Yes Did it involve sudden or severe rash/hives, skin peeling, or any reaction on the inside of your mouth or nose? No Did you need to seek medical attention at a hospital or doctor's office? Yes When did it last happen?      40 years ago/ Patient was in the hospital when happend If all above answers are NO, may proceed with cephalosporin use.   Rubbing Alcohol [Alcohol] Other (See Comments)    Severe skin warmth   Shellfish Allergy Anaphylaxis   Adhesive [Tape]     Able to tolerate Paper Tape   Medical Adhesive Remover     Other reaction(s): Not available   Sulfa Antibiotics     Other reaction(s): Not available    Current Meds  Medication Sig   acetaminophen  (TYLENOL ) 500 MG tablet Take 500-1,000 mg by mouth every 6 (six) hours as needed (pain.).   albuterol  (VENTOLIN  HFA) 108 (90 Base) MCG/ACT inhaler Inhale 2 puffs into the lungs every 6 (six) hours as needed.   Cholecalciferol (VITAMIN D3) 125 MCG (5000 UT) CAPS Take 1 capsule (5,000 Units total) by mouth daily. For 8 weeks, then start Vitamin D3 2,000 units daily (OTC) (Patient taking differently: Take 5,000 Units by mouth daily.)   fexofenadine (ALLEGRA) 180 MG tablet Take 180 mg by mouth daily as needed for allergies or rhinitis.   fluticasone  (FLONASE ) 50 MCG/ACT nasal spray PLACE 2 SPRAYS INTO BOTH NOSTRILS DAILY. USE FOR 4-6 WEEKS THEN STOP AND USE SEASONALLY OR AS NEEDED (Patient taking differently: Place 2 sprays into both nostrils daily as needed for allergies.)   fluticasone  furoate-vilanterol (BREO ELLIPTA ) 100-25 MCG/ACT AEPB Inhale 1 puff into the lungs daily.   gabapentin  (NEURONTIN ) 300 MG capsule Take 1 capsule (300 mg total) by mouth 3 (three) times daily.   montelukast  (SINGULAIR ) 10 MG tablet TAKE 1 TABLET BY MOUTH EVERYDAY AT BEDTIME (Patient taking differently: Take 10 mg by mouth daily as needed (allergies.).)   nystatin cream (MYCOSTATIN) Apply 1 application  topically 2 (two)  times daily as needed (skin irritation.).   omeprazole  (PRILOSEC  OTC) 20 MG tablet Take 1 tablet (20 mg total) by mouth daily.   SYNTHROID  125 MCG tablet Take 125 mcg by mouth daily before breakfast.   triamcinolone  cream (KENALOG ) 0.1 % Apply 1 Application topically 2 (two) times daily as needed (skin irritation.).    Immunization History  Administered Date(s) Administered   PFIZER(Purple Top)SARS-COV-2 Vaccination 01/08/2020, 01/29/2020, 10/24/2020   PNEUMOCOCCAL CONJUGATE-20 08/17/2021   Tdap 08/28/2018        Objective:     BP 132/88 (BP Location: Left Arm, Patient Position: Sitting)   Pulse 60   Temp 98.8 F (37.1 C) (Oral)   Ht 5' 8 (1.727 m)   Wt 215 lb 9.6 oz (97.8 kg)   LMP  (LMP Unknown)   SpO2 100%   BMI 32.78 kg/m   SpO2: 100 %  GENERAL: Well-developed, well-nourished woman, no acute distress. HEAD: Normocephalic, atraumatic.  EYES: Pupils equal, round, reactive to light.  No scleral icterus.  MOUTH:  Dentition intact, oral mucosa moist.  No thrush. NECK: Supple.  Prominent right thyroid lobe. Trachea midline. No JVD.  No adenopathy. PULMONARY: Good air entry bilaterally.  No adventitious sounds. CARDIOVASCULAR: S1 and S2. Regular rate and rhythm.  No rubs, murmurs or gallops heard. ABDOMEN: Benign MUSCULOSKELETAL: No joint deformity, no clubbing, no edema.  NEUROLOGIC: No overt focal deficit, no gait disturbance, speech is fluent. SKIN: Intact,warm,dry. PSYCH: Mood and behavior normal.  Lab Results  Component Value Date   NITRICOXIDE 26 06/01/2024  *Low level of type II inflammation present       Assessment & Plan:     ICD-10-CM   1. Shortness of breath  R06.02 Nitric oxide     2. Mild persistent asthma without complication  J45.30     3. Adenocarcinoma of left lung (HCC)  C34.92       Orders Placed This Encounter  Procedures   Nitric oxide     Meds ordered this encounter  Medications   fluticasone  furoate-vilanterol (BREO ELLIPTA )  100-25 MCG/ACT AEPB    Sig: Inhale 1 puff into the lungs daily.    Dispense:  60 each    Refill:  5   albuterol  (VENTOLIN  HFA) 108 (90 Base) MCG/ACT inhaler    Sig: Inhale 2 puffs into the lungs every 6 (six) hours as needed.    Dispense:  8 g    Refill:  2   Discussion:    Adenocarcinoma of lung, status post resection Status post resection for adenocarcinoma of the lung. Continued monitoring is necessary due to potential recurrence. - Continue follow-up with Dr. Babara and with Dr. 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  Mild persistent asthma Mild persistent asthma with low level of type two inflammation (nitric oxide  level of 26). Pre-surgery tests showed mild obstruction consistent with asthma. Symptoms include pain when coughing and sneezing, possibly exacerbated by allergies or medication. - Prescribe Breo 100, 1 puff daily for asthma management - Provide emergency inhaler for use as needed - Nitric oxide  today was 26 ppb consistent with low level of type II inflammation    Will see the patient in follow-up in 4 months time she is to contact us  prior to that time should any new difficulties arise.  Advised if symptoms do not improve or worsen, to please contact office for sooner follow up or seek emergency care.    I spent 33 minutes of dedicated to the care of this patient on the date of this encounter to include pre-visit review of records, face-to-face time with the patient discussing conditions above, post visit ordering of testing, clinical documentation with the electronic health record, making appropriate referrals as documented, and communicating necessary findings to members of the patients care team.     C. Leita Sanders, MD [facility]    *This note was generated using voice recognition software/Dragon and/or AI transcription program.  Despite best efforts to proofread, errors can occur which can change the meaning. Any transcriptional errors that result from this process are unintentional and may not be fully corrected at the time of dictation.

## 2024-06-02 ENCOUNTER — Other Ambulatory Visit: Payer: Self-pay | Admitting: Physician Assistant

## 2024-06-07 ENCOUNTER — Telehealth: Payer: Self-pay

## 2024-06-07 NOTE — Telephone Encounter (Signed)
 I spoke to her on the phone for a different matter. But she stated she needs to be scheduled for a AWV

## 2024-06-07 NOTE — Telephone Encounter (Signed)
 Copied from CRM [id number]. Topic: Clinical - Request for Lab/Test Order >> Jun 07, 2024 11:51 AM Edsel HERO wrote: Patient states that she is scheduled to see her Endocrinologist at [facility] on Wednesday, August 13th and would like to know if Dr. Edman would be ok sending his lab orders for her visit with him on August 14th to them so that she only has to have her blood drawn once. Please advise.

## 2024-06-08 ENCOUNTER — Ambulatory Visit: Admitting: Thoracic Surgery (Cardiothoracic Vascular Surgery)

## 2024-06-10 ENCOUNTER — Encounter: Payer: Self-pay | Admitting: Family Medicine

## 2024-06-10 ENCOUNTER — Other Ambulatory Visit: Payer: Self-pay | Admitting: Family Medicine

## 2024-06-10 ENCOUNTER — Ambulatory Visit (INDEPENDENT_AMBULATORY_CARE_PROVIDER_SITE_OTHER): Payer: Medicare Other | Admitting: Family Medicine

## 2024-06-10 VITALS — BP 130/78 | HR 81 | Ht 68.0 in | Wt 215.4 lb

## 2024-06-10 DIAGNOSIS — R7303 Prediabetes: Secondary | ICD-10-CM

## 2024-06-10 DIAGNOSIS — R030 Elevated blood-pressure reading, without diagnosis of hypertension: Secondary | ICD-10-CM

## 2024-06-10 DIAGNOSIS — Z0000 Encounter for general adult medical examination without abnormal findings: Secondary | ICD-10-CM

## 2024-06-10 DIAGNOSIS — C3492 Malignant neoplasm of unspecified part of left bronchus or lung: Secondary | ICD-10-CM

## 2024-06-10 DIAGNOSIS — Z87892 Personal history of anaphylaxis: Secondary | ICD-10-CM

## 2024-06-10 DIAGNOSIS — J3089 Other allergic rhinitis: Secondary | ICD-10-CM

## 2024-06-10 DIAGNOSIS — K317 Polyp of stomach and duodenum: Secondary | ICD-10-CM | POA: Diagnosis not present

## 2024-06-10 DIAGNOSIS — Z902 Acquired absence of lung [part of]: Secondary | ICD-10-CM

## 2024-06-10 DIAGNOSIS — E559 Vitamin D deficiency, unspecified: Secondary | ICD-10-CM

## 2024-06-10 DIAGNOSIS — J4530 Mild persistent asthma, uncomplicated: Secondary | ICD-10-CM | POA: Insufficient documentation

## 2024-06-10 DIAGNOSIS — E7800 Pure hypercholesterolemia, unspecified: Secondary | ICD-10-CM

## 2024-06-10 DIAGNOSIS — D508 Other iron deficiency anemias: Secondary | ICD-10-CM

## 2024-06-10 LAB — POCT GLYCOSYLATED HEMOGLOBIN (HGB A1C): Hemoglobin A1C: 6 % — AB (ref 4.0–5.6)

## 2024-06-10 MED ORDER — EPINEPHRINE 0.3 MG/0.3ML IJ SOAJ: 0.3000 mg | INTRAMUSCULAR | 1 refills | Status: AC | PRN

## 2024-06-10 MED ORDER — MONTELUKAST SODIUM 10 MG PO TABS: 10.0000 mg | ORAL_TABLET | Freq: Every day | ORAL | 3 refills | Status: AC

## 2024-06-10 MED ORDER — FLUTICASONE PROPIONATE 50 MCG/ACT NA SUSP: 2.0000 | Freq: Every day | NASAL | 3 refills | Status: AC

## 2024-06-10 NOTE — Patient Instructions (Addendum)
 Thank you for coming to the office today.  We will work on re-scheduling / referring to GI to get it arranged.  DUE for FASTING BLOOD WORK (no food or drink after midnight before the lab appointment, only water or coffee without cream/sugar on the morning of)  SCHEDULE Lab Only visit in the morning at the clinic for lab draw in 6 MONTHS   - Make sure Lab Only appointment is at about 1 week before your next appointment, so that results will be available  For Lab Results, once available within 2-3 days of blood draw, you can can log in to MyChart online to view your results and a brief explanation. Also, we can discuss results at next follow-up visit.   Please schedule a Follow-up Appointment to: Return for 6 month fasting lab > 1 week later Annual Physical.  If you have any other questions or concerns, please feel free to call the office or send a message through MyChart. You may also schedule an earlier appointment if necessary.  Additionally, you may be receiving a survey about your experience at our office within a few days to 1 week by e-mail or mail. We value your feedback.  Marsa Officer, DO [facility], [place]

## 2024-06-10 NOTE — Progress Notes (Signed)
 Subjective:    Patient ID: Amanda Pierce, female    DOB: January 22, 1956, 68 y.o.   MRN: [id number]  Amanda Pierce is a 68 y.o. female presenting on 06/10/2024 for Medical Management of Chronic Issues, Lung Cancer, and Pre-Diabetes   HPI  Discussed the use of AI scribe software for clinical note transcription with the patient, who gave verbal consent to proceed.  History of Present Illness   Amanda Pierce is a 68 year old female with asthma and lung cancer who presents for a six-month follow-up and EpiPen  prescription.  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 - Status post lobectomy - Lymph node biopsy negative - Requested printed copy of pathology report for personal records Followed by Oncology and Cardiothoracic surgery has upcoming follow up and imaging surveillance  Pre-Diabetes Glycemic control - Recent hemoglobin A1c is 6.0, improved from previous high of 6.4 - Remains in prediabetic range - Dietary habits influenced by convenience due to living situation, including frequent consumption of sandwiches  Allergy to contrast dye - Known allergy to contrast dye, complicating imaging studies - Prescribed prednisone  and Benadryl as premedication regimen for contrast studies - Apprehensive about premedication due to sensitivity to Benadryl  Specialist care coordination - Managing multiple specialist appointments including endocrinology, pulmonology, and cardiothoracic surgery - Recent thyroid scan performed; results to be discussed with endocrinologist Dr. Cherilyn           06/10/2024    6:02 PM 08/05/2022    4:08 PM  07/29/2022    2:52 PM  Depression screen PHQ 2/9  Decreased Interest 1 1 3   Down, Depressed, Hopeless 0 0 3  PHQ - 2 Score 1 1 6   Altered sleeping 1 2 3   Tired, decreased energy 1 1 2   Change in appetite 1 1 2   Feeling bad or failure about yourself  0 0 1  Trouble concentrating 1 1 0  Moving slowly or fidgety/restless 0 0 0  Suicidal thoughts 0 0 0  PHQ-9 Score 5 6 14   Difficult doing work/chores Not difficult at all Not difficult at all Very difficult        No data to display          Social History   Tobacco Use   Smoking status: Never   Smokeless tobacco: Never  Vaping Use   Vaping status: Never Used  Substance Use Topics   Alcohol use: Never   Drug use: Never    Review of Systems Per HPI unless specifically indicated above     Objective:    BP 130/78 (BP Location: Left Arm, Patient Position: Sitting, Cuff Size: Normal)   Pulse 81   Ht 5' 8 (1.727 m)   Wt 215 lb 6 oz (97.7 kg)   LMP  (LMP Unknown)   SpO2 98%   BMI 32.75 kg/m   Wt Readings from Last 3 Encounters:  06/10/24 215 lb 6 oz (97.7 kg)  06/01/24 215 lb 9.6 oz (97.8 kg)  05/25/24 214 lb (97.1 kg)    Physical Exam Vitals and nursing note reviewed.  Constitutional:      General: She is not in acute distress.    Appearance: She is well-developed.  She is not diaphoretic.     Comments: Well-appearing, comfortable, cooperative  HENT:     Head: Normocephalic and atraumatic.  Eyes:     General:        Right eye: No discharge.        Left eye: No discharge.     Conjunctiva/sclera: Conjunctivae normal.  Neck:     Thyroid: No thyromegaly.  Cardiovascular:     Rate and Rhythm: Normal rate and regular rhythm.     Heart sounds: Normal heart sounds. No murmur heard. Pulmonary:     Effort: Pulmonary effort is normal. No respiratory distress.     Breath sounds: Normal breath sounds. No wheezing or rales.  Musculoskeletal:        General: Normal range of motion.     Cervical back: Normal range of  motion and neck supple.  Lymphadenopathy:     Cervical: No cervical adenopathy.  Skin:    General: Skin is warm and dry.     Findings: No erythema or rash.  Neurological:     Mental Status: She is alert and oriented to person, place, and time.  Psychiatric:        Behavior: Behavior normal.     Comments: Well groomed, good eye contact, normal speech and thoughts     Results for orders placed or performed in visit on 06/10/24  POCT HgB A1C   Collection Time: 06/10/24 11:19 AM  Result Value Ref Range   Hemoglobin A1C 6.0 (A) 4.0 - 5.6 %   HbA1c POC (<> result, manual entry)     HbA1c, POC (prediabetic range)     HbA1c, POC (controlled diabetic range)        Assessment & Plan:   Problem List Items Addressed This Visit     Adenocarcinoma of left lung (HCC) (Chronic)   Gastric polyp   Relevant Orders   Ambulatory referral to Gastroenterology   Mild persistent asthma without complication   Relevant Medications   montelukast  (SINGULAIR ) 10 MG tablet   RESOLVED: Non-small cell cancer of left lung (HCC)   Pre-diabetes - Primary   Relevant Orders   POCT HgB A1C (Completed)   S/P lobectomy of lung   Other Visit Diagnoses       History of anaphylaxis       Relevant Medications   EPINEPHrine  (EPIPEN  2-PAK) 0.3 mg/0.3 mL IJ SOAJ injection     Environmental and seasonal allergies       Relevant Medications   montelukast  (SINGULAIR ) 10 MG tablet     Seasonal allergic rhinitis due to other allergic trigger       Relevant Medications   fluticasone  (FLONASE ) 50 MCG/ACT nasal spray        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 - Provide a copy of the pathology report to her. Follow with Oncology, Pulmonology, CVTS - Continue follow-up with cardiothoracic specialist on September 30th. - Plan CT scan in January.  Hyperplastic gastric polyp, follow-up overdue Follow-up for  hyperplastic gastric polyp overdue. She has had EGD Endoscopy and Colonoscopy however different return call back times given for each. Colonoscopy done 2020, was no polyp repeat 10 year 2030, however EGD had polyp with repeat EGD within 1 year anticipated 2021 but it was never scheduled or arranged - Re-refer to GI for follow-up endoscopy. - Determine if follow-up can be scheduled with [facility] or local providers [facility] vs [facility]  History  Anaphylaxis and risk of allergic reaction Anaphylaxis risk managed with EpiPen . She also asks about Epinephrine  nasal spray in future - Prescribe EpiPen  and send order to [facility].  Allergy to iodinated contrast media Allergy to iodinated contrast media. Discussed premedication with prednisone  and Benadryl, but she is apprehensive. - Discuss alternative imaging strategies with oncology team to avoid contrast media or pre-treat  Asthma Followed by Pulm Asthma managed with Breo and Albuterol  inhaler.  Prediabetes A1c improved to 6.0 from 6.4. No medication required. Discussed dietary impact on A1c. - Continue monitoring A1c levels.      Referral to return to GI for follow-up. Previous EGD 2021 with gastric polyp removed and advised repeat 1 year, but not completed. Has had prior Colonoscopy as well 2020, and advised 10 yr repeat but she asks about repeat both if indicated   Refer GI - figure out if can be [place], or local [place] [facility] or [place]   Orders Placed This Encounter  Procedures   Ambulatory referral to Gastroenterology    Referral Priority:   Routine    Referral Type:   Consultation    Referral Reason:   Specialty Services Required    Number of Visits Requested:   1   POCT HgB A1C    Meds ordered this encounter  Medications   EPINEPHrine  (EPIPEN  2-PAK) 0.3 mg/0.3 mL IJ SOAJ injection    Sig: Inject 0.3 mg into the muscle as needed for anaphylaxis.    Dispense:  1 each    Refill:  1   montelukast  (SINGULAIR ) 10 MG tablet    Sig: Take 1  tablet (10 mg total) by mouth at bedtime.    Dispense:  90 tablet    Refill:  3   fluticasone  (FLONASE ) 50 MCG/ACT nasal spray    Sig: Place 2 sprays into both nostrils daily.    Dispense:  48 mL    Refill:  3    Follow up plan: Return for 6 month fasting lab > 1 week later Annual Physical.  Future labs ordered for 11/2024  Marsa Officer, DO [facility] 06/10/2024, 11:27 AM

## 2024-06-23 ENCOUNTER — Encounter: Payer: Self-pay | Admitting: Registered Nurse

## 2024-07-16 ENCOUNTER — Ambulatory Visit

## 2024-07-16 VITALS — Ht 68.0 in | Wt 213.0 lb

## 2024-07-16 DIAGNOSIS — Z0000 Encounter for general adult medical examination without abnormal findings: Secondary | ICD-10-CM | POA: Diagnosis not present

## 2024-07-16 NOTE — Progress Notes (Signed)
 Subjective:   Amanda Pierce is a 68 y.o. who presents for a Medicare Wellness preventive visit.  As a reminder, Annual Wellness Visits don't include a physical exam, and some assessments may be limited, especially if this visit is performed virtually. We may recommend an in-person follow-up visit with your provider if needed.  Visit Complete: Virtual I connected with  [name] on 07/16/24 by a audio enabled telemedicine application and verified that I am speaking with the correct person using two identifiers.  Patient Location: Home  Provider Location: Office/Clinic  I discussed the limitations of evaluation and management by telemedicine. The patient expressed understanding and agreed to proceed.  Vital Signs: Because this visit was a virtual/telehealth visit, some criteria may be missing or patient reported. Any vitals not documented were not able to be obtained and vitals that have been documented are patient reported.  VideoDeclined- This patient declined Librarian, academic. Therefore the visit was completed with audio only.  Persons Participating in Visit: Patient.  AWV Questionnaire: No: Patient Medicare AWV questionnaire was not completed prior to this visit.  Cardiac Risk Factors include: advanced age (>16men, >87 women);dyslipidemia;obesity (BMI >30kg/m2)     Objective:    Today's Vitals   07/16/24 1405  Weight: 213 lb (96.6 kg)  Height: 5' 8 (1.727 m)   Body mass index is 32.39 kg/m.     07/16/2024    2:06 PM 04/05/2024   12:00 AM 02/02/2024    9:42 AM 01/28/2024    5:01 PM 08/24/2023    2:56 PM 11/08/2022    4:48 PM 12/12/2021   12:52 PM  Advanced Directives  Does Patient Have a Medical Advance Directive? Yes No No No Yes Yes No  Type of Estate agent of [place];Living will    Healthcare Power of [place];Living will Living will   Copy of Healthcare Power of Attorney in Chart? No - copy requested        Would  patient like information on creating a medical advance directive?  No - Patient declined No - Patient declined        Current Medications (verified) Outpatient Encounter Medications as of 07/16/2024  Medication Sig   acetaminophen  (TYLENOL ) 500 MG tablet Take 500-1,000 mg by mouth every 6 (six) hours as needed (pain.).   albuterol  (VENTOLIN  HFA) 108 (90 Base) MCG/ACT inhaler Inhale 2 puffs into the lungs every 6 (six) hours as needed.   Cholecalciferol (VITAMIN D3) 125 MCG (5000 UT) CAPS Take 1 capsule (5,000 Units total) by mouth daily. For 8 weeks, then start Vitamin D3 2,000 units daily (OTC) (Patient taking differently: Take 5,000 Units by mouth daily.)   EPINEPHrine  (EPIPEN  2-PAK) 0.3 mg/0.3 mL IJ SOAJ injection Inject 0.3 mg into the muscle as needed for anaphylaxis.   fexofenadine (ALLEGRA) 180 MG tablet Take 180 mg by mouth daily as needed for allergies or rhinitis.   fluticasone  (FLONASE ) 50 MCG/ACT nasal spray Place 2 sprays into both nostrils daily.   fluticasone  furoate-vilanterol (BREO ELLIPTA ) 100-25 MCG/ACT AEPB Inhale 1 puff into the lungs daily.   gabapentin  (NEURONTIN ) 300 MG capsule Take 1 capsule (300 mg total) by mouth 3 (three) times daily.   montelukast  (SINGULAIR ) 10 MG tablet Take 1 tablet (10 mg total) by mouth at bedtime.   nystatin cream (MYCOSTATIN) Apply 1 application  topically 2 (two) times daily as needed (skin irritation.).   omeprazole  (PRILOSEC  OTC) 20 MG tablet Take 1 tablet (20 mg total) by mouth daily.   [  START ON 10/25/2024] predniSONE  (DELTASONE ) 50 MG tablet Take 1 tablet (50 mg total) by mouth See admin instructions. Prednisone  - take 50 mg by mouth at 13 hours, 7 hours, and 1 hour before contrast media injection   SYNTHROID  125 MCG tablet Take 125 mcg by mouth daily before breakfast.   triamcinolone  cream (KENALOG ) 0.1 % Apply 1 Application topically 2 (two) times daily as needed (skin irritation.).   No facility-administered encounter medications on  file as of 07/16/2024.    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   GERD (gastroesophageal reflux disease)    History of hiatal hernia    Hypothyroidism    Iron  deficiency anemia    Left lower lobe pulmonary nodule 12/2023   Osteopenia    Pneumonia    Pre-diabetes    Pure hypercholesterolemia    Suspected sleep apnea    Thyroid disease    hypothyroid   Past Surgical History:  Procedure Laterality Date   BREAST MASS EXCISION Left 1974   benign   BRONCHOSCOPY, WITH BIOPSY USING ELECTROMAGNETIC NAVIGATION Left 02/02/2024   Procedure: BRONCHOSCOPY, WITH BIOPSY USING ELECTROMAGNETIC NAVIGATION;  Surgeon: Tamea Dedra CROME, MD;  Location: [facility] ORS;  Service: Pulmonary;  Laterality: Left;   COLONOSCOPY WITH PROPOFOL  N/A 10/28/2019   Procedure: COLONOSCOPY WITH PROPOFOL ;  Surgeon: Janalyn Keene NOVAK, MD;  Location: [facility] ENDOSCOPY;  Service: Endoscopy;  Laterality: N/A;   ENDOSCOPIC MUCOSAL RESECTION N/A 11/22/2019   Procedure: ENDOSCOPIC MUCOSAL RESECTION;  Surgeon: Wilhelmenia Aloha Raddle., MD;  Location[facility]COPY;  Service: Gastroenterology;  Laterality: N/A;   ESOPHAGOGASTRODUODENOSCOPY (EGD) WITH PROPOFOL  N/A 10/28/2019   Procedure: ESOPHAGOGASTRODUODENOSCOPY (EGD) WITH PROPOFOL ;  Surgeon: Janalyn Keene NOVAK, MD;  Location: [facility] ENDOSCOPY;  Service: Endoscopy;  Laterality: N/A;   ESOPHAGOGASTRODUODENOSCOPY (EGD) WITH PROPOFOL  N/A 11/22/2019   Procedure: ESOPHAGOGASTRODUODENOSCOPY (EGD) WITH PROPOFOL ;  Surgeon: Wilhelmenia Aloha Raddle., MD;  Location: [facility] ENDOSCOPY;  Service: Gastroenterology;  Laterality: N/A;   HEMOSTASIS CLIP PLACEMENT  11/22/2019    Procedure: HEMOSTASIS CLIP PLACEMENT;  Surgeon: Wilhelmenia Aloha Raddle., MD;  Location: [facility] ENDOSCOPY;  Service: Gastroenterology;;   HEMOSTASIS CONTROL  11/22/2019   Procedure: HEMOSTASIS CONTROL;  Surgeon: Wilhelmenia Aloha Raddle., MD;  Location: [facility] ENDOSCOPY;  Service: Gastroenterology;;  epi    INTERCOSTAL NERVE BLOCK Left 04/02/2024   Procedure: BLOCK, NERVE, INTERCOSTAL;  Surgeon: Kerrin Elspeth BROCKS, MD;  Location: [facility] OR;  Service: Thoracic;  Laterality: Left;   LOBECTOMY, LUNG, ROBOT-ASSISTED, USING VATS Left 04/02/2024   Procedure: LOBECTOMY, LUNG, ROBOT-ASSISTED, USING VATS;  Surgeon: Kerrin Elspeth BROCKS, MD;  Location: [facility] OR;  Service: Thoracic;  Laterality: Left;  ROBOTIC LEFT LOWER LOBECTOMY   SENTINEL NODE BIOPSY  04/02/2024   Procedure: BIOPSY, LYMPH NODE;  Surgeon: Kerrin Elspeth BROCKS, MD;  Location: [facility] OR;  Service: Thoracic;;   SUBMUCOSAL LIFTING INJECTION  11/22/2019   Procedure: SUBMUCOSAL LIFTING INJECTION;  Surgeon: Wilhelmenia Aloha Raddle., MD;  Location: [facility] ENDOSCOPY;  Service: Gastroenterology;;   THYROIDECTOMY, PARTIAL Left 1982   TUBAL LIGATION     VIDEO BRONCHOSCOPY WITH ENDOBRONCHIAL ULTRASOUND Left 02/02/2024   Procedure: BRONCHOSCOPY, WITH EBUS;  Surgeon: Tamea Dedra CROME, MD;  Location: [facility] ORS;  Service: Pulmonary;  Laterality: Left;  WISDOM TOOTH EXTRACTION     Family History  Problem Relation Age of Onset   Cancer Mother        Breast, Paget's removed nipple 66, breast 2001   Colon polyps Mother    Paget's disease of bone Mother    Hypertension Mother    Hyperlipidemia Mother    Heart disease Father    Heart attack Father    Heart disease Maternal Grandfather    Stroke Paternal Grandmother    Colon cancer Neg Hx    Esophageal cancer Neg Hx    Inflammatory bowel disease Neg Hx    Liver disease Neg Hx    Pancreatic cancer Neg Hx    Rectal cancer Neg Hx    Stomach cancer Neg Hx    Social History   Socioeconomic History   Marital status: Widowed     Spouse name: Not on file   Number of children: 4   Years of education: Not on file   Highest education level: Not on file  Occupational History   Occupation: retired  Tobacco Use   Smoking status: Never   Smokeless tobacco: Never  Vaping Use   Vaping status: Never Used  Substance and Sexual Activity   Alcohol use: Not Currently   Drug use: Never   Sexual activity: Not Currently  Other Topics Concern   Not on file  Social History Narrative   Lives alone   Social Drivers of Health   Financial Resource Strain: Low Risk  (07/16/2024)   Overall Financial Resource Strain (CARDIA)    Difficulty of Paying Living Expenses: Not hard at all  Food Insecurity: No Food Insecurity (07/16/2024)   Hunger Vital Sign    Worried About Running Out of Food in the Last Year: Never true    Ran Out of Food in the Last Year: Never true  Transportation Needs: No Transportation Needs (07/16/2024)   PRAPARE - Administrator, Civil Service (Medical): No    Lack of Transportation (Non-Medical): No  Physical Activity: Inactive (07/16/2024)   Exercise Vital Sign    Days of Exercise per Week: 0 days    Minutes of Exercise per Session: 0 min  Stress: Stress Concern Present (07/16/2024)   [employer] of Occupational Health - Occupational Stress Questionnaire    Feeling of Stress: To some extent  Social Connections: Socially Isolated (07/16/2024)   Social Connection and Isolation Panel    Frequency of Communication with Friends and Family: More than three times a week    Frequency of Social Gatherings with Friends and Family: More than three times a week    Attends Religious Services: Never    Database administrator or Organizations: No    Attends Banker Meetings: Never    Marital Status: Widowed    Tobacco Counseling Counseling given: Not Answered    Clinical Intake:  Pre-visit preparation completed: Yes  Pain : No/denies pain     BMI - recorded:  32.39 Nutritional Status: BMI > 30  Obese Nutritional Risks: None Diabetes: No  Lab Results  Component Value Date   HGBA1C 6.0 (A) 06/10/2024   HGBA1C 6.4 (H) 12/03/2023   HGBA1C 6.1 (H) 08/17/2021     How often do you need to have someone help you when you read instructions, pamphlets, or other written materials from your doctor or pharmacy?: 1 - Never  Interpreter Needed?: No  Information entered by :: Verdie Saba, CMA   Activities of Daily Living  07/16/2024    2:10 PM 04/05/2024   12:00 AM  In your present state of health, do you have any difficulty performing the following activities:  Hearing? 0 0  Vision? 0 0  Difficulty concentrating or making decisions? 0 0  Walking or climbing stairs? 0   Dressing or bathing? 0   Doing errands, shopping? 0 0  Preparing Food and eating ? N   Using the Toilet? N   In the past six months, have you accidently leaked urine? N   Do you have problems with loss of bowel control? N   Managing your Medications? N   Managing your Finances? N   Housekeeping or managing your Housekeeping? N     Patient Care Team: Edman Marsa PARAS, DO as PCP - General (Family Medicine) Babara Call, MD as Consulting Physician (Oncology) Tamea Dedra CROME, MD as Consulting Physician (Pulmonary Disease) Verdene Gills, RN as Oncology Nurse Navigator Mincey, Prentice Clack, MD as Consulting Physician (Ophthalmology)  I have updated your Care Teams any recent Medical Services you may have received from other providers in the past year.     Assessment:   This is a routine wellness examination for Amanda Pierce.  Hearing/Vision screen Hearing Screening - Comments:: Denies hearing difficulties   Vision Screening - Comments:: Wears rx glasses - up to date with routine eye exams with Dr Regenia   Goals Addressed               This Visit's Progress     Patient Stated (pt-stated)        Patient stated she plans to do more exercising and watching her  A1C levels       Depression Screen     07/16/2024    2:10 PM 06/10/2024    6:02 PM 08/05/2022    4:08 PM 07/29/2022    2:52 PM 04/03/2021   11:42 AM 04/25/2020    1:53 PM 10/13/2019    2:29 PM  PHQ 2/9 Scores  PHQ - 2 Score 0 1 1 6 3  0 0  PHQ- 9 Score 0 5 6 14 6       Fall Risk     07/16/2024    2:10 PM 07/29/2022    2:51 PM 04/03/2021   11:41 AM 04/25/2020    1:53 PM 10/13/2019    2:29 PM  Fall Risk   Falls in the past year? 0 0 0 0 0   Number falls in past yr: 0 0  0 0   Injury with Fall? 0 0  0 0  Risk for fall due to : No Fall Risks No Fall Risks No Fall Risks    Follow up Falls evaluation completed;Falls prevention discussed Falls evaluation completed  Falls evaluation completed;Education provided;Falls prevention discussed  Falls evaluation completed  Falls evaluation completed      Data saved with a previous flowsheet row definition    MEDICARE RISK AT HOME:  Medicare Risk at Home Any stairs in or around the home?: Yes (outside) If so, are there any without handrails?: No Home free of loose throw rugs in walkways, pet beds, electrical cords, etc?: Yes Adequate lighting in your home to reduce risk of falls?: Yes Life alert?: No Use of a cane, walker or w/c?: No Grab bars in the bathroom?: Yes Shower chair or bench in shower?: No Elevated toilet seat or a handicapped toilet?: No  TIMED UP AND GO:  Was the test performed?  No  Cognitive Function: 6CIT completed  07/16/2024    2:13 PM 07/29/2022    2:57 PM 04/03/2021   11:45 AM  6CIT Screen  What Year? 0 points 0 points 0 points  What month? 0 points 0 points 0 points  What time? 0 points 0 points 0 points  Count back from 20 0 points 0 points 0 points  Months in reverse 0 points 0 points 0 points  Repeat phrase 0 points 0 points 0 points  Total Score 0 points 0 points 0 points    Immunizations Immunization History  Administered Date(s) Administered   PFIZER(Purple Top)SARS-COV-2 Vaccination  01/08/2020, 01/29/2020, 10/24/2020   PNEUMOCOCCAL CONJUGATE-20 08/17/2021   Tdap 08/28/2018    Screening Tests Health Maintenance  Topic Date Due   COVID-19 Vaccine (4 - 2025-26 season) 06/28/2024   Zoster Vaccines- Shingrix (1 of 2) 09/10/2024 (Originally 03/23/1975)   Influenza Vaccine  01/25/2025 (Originally 05/28/2024)   Medicare Annual Wellness (AWV)  07/16/2025   DTaP/Tdap/Td (2 - Td or Tdap) 08/28/2028   Colonoscopy  10/27/2029   Pneumococcal Vaccine: 50+ Years  Completed   DEXA SCAN  Completed   Hepatitis C Screening  Completed   HPV VACCINES  Aged Out   Meningococcal B Vaccine  Aged Out   Mammogram  Discontinued    Health Maintenance Items Addressed:  I have recommended that this patient have a flu shot and immunization for Shingles but she declines at this time. I have discussed the risks and benefits of this procedure with her. The patient verbalizes understanding.   Additional Screening:  Vision Screening: Recommended annual ophthalmology exams for early detection of glaucoma and other disorders of the eye. Is the patient up to date with their annual eye exam?  Yes  Who is the provider or what is the name of the office in which the patient attends annual eye exams? Dr Prentice Mandes  Dental Screening: Recommended annual dental exams for proper oral hygiene  Community Resource Referral / Chronic Care Management: CRR required this visit?  No   CCM required this visit?  No   Plan:    I have personally reviewed and noted the following in the patient's chart:   Medical and social history Use of alcohol, tobacco or illicit drugs  Current medications and supplements including opioid prescriptions. Patient is not currently taking opioid prescriptions. Functional ability and status Nutritional status Physical activity Advanced directives List of other physicians Hospitalizations, surgeries, and ER visits in previous 12 months Vitals Screenings to include  cognitive, depression, and falls Referrals and appointments  In addition, I have reviewed and discussed with patient certain preventive protocols, quality metrics, and best practice recommendations. A written personalized care plan for preventive services as well as general preventive health recommendations were provided to patient.   Verdie CHRISTELLA Saba, CMA   07/16/2024   After Visit Summary: (MyChart) Due to this being a telephonic visit, the after visit summary with patients personalized plan was offered to patient via MyChart   Notes: Nothing significant to report at this time.

## 2024-07-16 NOTE — Patient Instructions (Addendum)
 Ms. Siebers,  Thank you for taking the time for your Medicare Wellness Visit. I appreciate your continued commitment to your health goals. Please review the care plan we discussed, and feel free to reach out if I can assist you further.  Medicare recommends these wellness visits once per year to help you and your care team stay ahead of potential health issues. These visits are designed to focus on prevention, allowing your provider to concentrate on managing your acute and chronic conditions during your regular appointments.  Please note that Annual Wellness Visits do not include a physical exam. Some assessments may be limited, especially if the visit was conducted virtually. If needed, we may recommend a separate in-person follow-up with your provider.  Ongoing Care Seeing your primary care provider every 3 to 6 months helps us  monitor your health and provide consistent, personalized care.   Referrals If a referral was made during today's visit and you haven't received any updates within two weeks, please contact the referred provider directly to check on the status.  Recommended Screenings:  Health Maintenance  Topic Date Due   COVID-19 Vaccine (4 - 2025-26 season) 06/28/2024   Zoster (Shingles) Vaccine (1 of 2) 09/10/2024*   Flu Shot  01/25/2025*   Medicare Annual Wellness Visit  07/16/2025   DTaP/Tdap/Td vaccine (2 - Td or Tdap) 08/28/2028   Colon Cancer Screening  10/27/2029   Pneumococcal Vaccine for age over 70  Completed   DEXA scan (bone density measurement)  Completed   Hepatitis C Screening  Completed   HPV Vaccine  Aged Out   Meningitis B Vaccine  Aged Out   Breast Cancer Screening  Discontinued  *Topic was postponed. The date shown is not the original due date.       07/16/2024    2:06 PM  Advanced Directives  Does Patient Have a Medical Advance Directive? Yes  Type of Estate agent of [place];Living will  Copy of Healthcare Power of Attorney in  Chart? No - copy requested   Advance Care Planning is important because it: Ensures you receive medical care that aligns with your values, goals, and preferences. Provides guidance to your family and loved ones, reducing the emotional burden of decision-making during critical moments.  Vision: Annual vision screenings are recommended for early detection of glaucoma, cataracts, and diabetic retinopathy. These exams can also reveal signs of chronic conditions such as diabetes and high blood pressure.  Dental: Annual dental screenings help detect early signs of oral cancer, gum disease, and other conditions linked to overall health, including heart disease and diabetes.

## 2024-07-21 ENCOUNTER — Other Ambulatory Visit: Payer: Self-pay | Admitting: Thoracic Surgery (Cardiothoracic Vascular Surgery)

## 2024-07-27 ENCOUNTER — Ambulatory Visit
Attending: Thoracic Surgery (Cardiothoracic Vascular Surgery) | Admitting: Thoracic Surgery (Cardiothoracic Vascular Surgery)

## 2024-07-27 ENCOUNTER — Encounter: Payer: Self-pay | Admitting: Thoracic Surgery (Cardiothoracic Vascular Surgery)

## 2024-07-27 VITALS — BP 155/87 | HR 63 | Resp 18 | Ht 68.0 in | Wt 218.0 lb

## 2024-07-27 DIAGNOSIS — C3492 Malignant neoplasm of unspecified part of left bronchus or lung: Secondary | ICD-10-CM | POA: Diagnosis not present

## 2024-07-27 DIAGNOSIS — Z09 Encounter for follow-up examination after completed treatment for conditions other than malignant neoplasm: Secondary | ICD-10-CM | POA: Diagnosis not present

## 2024-07-27 NOTE — Progress Notes (Signed)
 [address] [place] [address]             [phone]    HPI: Amanda Pierce returns for a scheduled follow-up visit after recent robotic left lower lobectomy.  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  She is a lifelong non-smoker.  She had a CT for coronary calcium screening and was incidentally noted to have a left lower lobe lung nodule.  Robotic bronchoscopy showed non-small cell carcinoma.  I did a robotic assisted left lower lobectomy on 04/02/2024.  Final pathology was stage Ib.  I saw her in the office near the end of July.  She was still having a lot of paresthesias on the left side.  Also had some burning sensation in the epigastrium.  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  She does have an history of ulcers.  Increased her gabapentin  and started her on omeprazole .  Interim since her last visit she stopped taking all medications for surgical pain.  She remains on the omeprazole .  Still has some pain but is controlled with Tylenol .   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 8 g 2   Cholecalciferol (VITAMIN D3) 125 MCG (5000 UT) CAPS Take 1 capsule (5,000 Units total) by mouth  daily. For 8 weeks, then start Vitamin D3 2,000 units daily (OTC) (Patient taking differently: Take 5,000 Units by mouth daily.) 90 capsule    EPINEPHrine  (EPIPEN  2-PAK) 0.3 mg/0.3 mL IJ SOAJ injection Inject 0.3 mg into the muscle as needed for anaphylaxis. 1 each 1   fexofenadine (ALLEGRA) 180 MG tablet Take 180 mg by mouth daily as needed for allergies or rhinitis.     fluticasone  (FLONASE ) 50 MCG/ACT nasal spray Place 2 sprays into both nostrils daily. 48 mL 3   fluticasone  furoate-vilanterol (BREO ELLIPTA ) 100-25 MCG/ACT AEPB Inhale 1 puff into the lungs daily. 60 each 5   liothyronine (CYTOMEL) 5 MCG tablet Take 5 mcg by mouth daily.     SYNTHROID  125 MCG tablet Take 100 mcg by mouth daily before breakfast.     triamcinolone  cream (KENALOG ) 0.1 % Apply 1 Application topically 2 (two) times daily as needed (skin irritation.).     acetaminophen  (TYLENOL ) 500 MG tablet Take 500-1,000 mg by mouth every 6 (six) hours as needed (pain.). (Patient not taking: Reported on 07/27/2024)     gabapentin  (NEURONTIN ) 300 MG capsule Take 1 capsule (300 mg total) by mouth 3 (three) times daily. (Patient not taking: Reported on 07/27/2024) 90 capsule 1   montelukast  (SINGULAIR ) 10 MG tablet Take 1 tablet (10 mg total)  by mouth at bedtime. (Patient not taking: Reported on 07/27/2024) 90 tablet 3   nystatin cream (MYCOSTATIN) Apply 1 application  topically 2 (two) times daily as needed (skin irritation.). (Patient not taking: Reported on 07/27/2024)     omeprazole  (PRILOSEC  OTC) 20 MG tablet Take 1 tablet (20 mg total) by mouth daily. (Patient not taking: Reported on 07/27/2024) 30 tablet 6   [START ON 10/25/2024] predniSONE  (DELTASONE ) 50 MG tablet Take 1 tablet (50 mg total) by mouth See admin instructions. Prednisone  - take 50 mg by mouth at 13 hours, 7 hours, and 1 hour before contrast media injection (Patient not taking: Reported on 07/27/2024) 3 tablet 0   No current facility-administered medications for this visit.     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  She is a lifelong non-smoker.  Stage Ib adenocarcinoma of the lung-non-smoker.  Status post left lower lobectomy.  No indication for adjuvant therapy.  Follow-up with semiannual CT scans.  I will plan to see her back after her next CT.  Postsurgical pain-not completely resolved but markedly improved.  No longer on gabapentin  or narcotics.  Occasionally uses Tylenol .   Plan: Return after the first of the year following your next CT scan.  Amanda JAYSON Millers, MD [facility] [phone]

## 2024-08-23 ENCOUNTER — Other Ambulatory Visit: Payer: Self-pay | Admitting: *Deleted

## 2024-08-23 DIAGNOSIS — G8918 Other acute postprocedural pain: Secondary | ICD-10-CM

## 2024-08-23 DIAGNOSIS — Z902 Acquired absence of lung [part of]: Secondary | ICD-10-CM

## 2024-08-24 ENCOUNTER — Other Ambulatory Visit: Payer: Self-pay | Admitting: Thoracic Surgery (Cardiothoracic Vascular Surgery)

## 2024-08-24 ENCOUNTER — Other Ambulatory Visit (HOSPITAL_COMMUNITY)

## 2024-08-24 ENCOUNTER — Ambulatory Visit (HOSPITAL_COMMUNITY)
Admission: RE | Admit: 2024-08-24 | Discharge: 2024-08-24 | Disposition: A | Discharge status: Home / Self Care | Source: Ambulatory Visit | Attending: Cardiology | Admitting: Cardiology

## 2024-08-24 ENCOUNTER — Ambulatory Visit

## 2024-08-24 VITALS — BP 147/86 | HR 67 | Resp 18 | Ht 68.0 in | Wt 217.0 lb

## 2024-08-24 DIAGNOSIS — G8918 Other acute postprocedural pain: Secondary | ICD-10-CM | POA: Diagnosis not present

## 2024-08-24 DIAGNOSIS — Z902 Acquired absence of lung [part of]: Secondary | ICD-10-CM | POA: Diagnosis present

## 2024-08-24 MED ORDER — PREGABALIN 25 MG PO CAPS: 25.0000 mg | ORAL_CAPSULE | Freq: Two times a day (BID) | ORAL | 0 refills | Status: AC

## 2024-08-24 NOTE — Progress Notes (Signed)
 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  She is a lifelong non-smoker. She presents for continued follow up after robotic left lower lobectomy on 04/02/2024 with Dr. Kerrin.   She was seen in clinic on 07/27/2024 and reported that her pain was well controlled with tylenol .  She had stopped taking gabapentin  for neuropathic pain due to making her very drowsy.  While taking gabapentin  she was sleeping over 12 hours a day. She was afraid of driving and possibly falling down due to the drowsiness.  She has been using tylenol  as needed for the pain. She finds that she has left sided pain main at night while trying to sleep.  The pain has been keeping her up at night. She also notes a slight bulge along her abdomen at the level of her incision sites.  The bulge is non painful. She denies chest pain, shortness of breath.    Current Outpatient Medications on File Prior to Visit  Medication Sig Dispense Refill   acetaminophen  (TYLENOL ) 500 MG tablet Take 500-1,000 mg by mouth every 6 (six) hours as needed (pain.). (Patient not taking: Reported on 07/27/2024)     albuterol  (VENTOLIN  HFA) 108 (90 Base) MCG/ACT inhaler Inhale 2 puffs into the lungs every 6 (six) hours as needed. 8 g 2   Cholecalciferol (VITAMIN D3) 125 MCG (5000 UT) CAPS Take 1 capsule (5,000 Units total) by mouth daily. For 8 weeks, then start Vitamin D3 2,000 units daily (OTC) (Patient taking differently: Take 5,000 Units by mouth daily.) 90 capsule    EPINEPHrine  (EPIPEN  2-PAK) 0.3 mg/0.3 mL IJ SOAJ injection Inject 0.3 mg into the muscle as needed for anaphylaxis. 1 each 1   fexofenadine  (ALLEGRA) 180 MG tablet Take 180 mg by mouth daily as needed for allergies or rhinitis.     fluticasone  (FLONASE ) 50 MCG/ACT nasal spray Place 2 sprays into both nostrils daily. 48 mL 3   fluticasone  furoate-vilanterol (BREO ELLIPTA ) 100-25 MCG/ACT AEPB Inhale 1 puff into the lungs daily. 60 each 5   liothyronine (CYTOMEL) 5 MCG tablet Take 5 mcg by mouth daily.     montelukast  (SINGULAIR ) 10 MG tablet Take 1 tablet (10 mg total) by mouth at bedtime. (Patient not taking: Reported on 07/27/2024) 90 tablet 3   nystatin cream (MYCOSTATIN) Apply 1 application  topically 2 (two) times daily as needed (skin irritation.). (Patient not taking: Reported on 07/27/2024)     omeprazole  (PRILOSEC  OTC) 20 MG tablet Take 1 tablet (20 mg total) by mouth daily. (Patient not taking: Reported on 07/27/2024) 30 tablet 6   [START ON 10/25/2024] predniSONE  (DELTASONE ) 50 MG tablet Take 1 tablet (50 mg total) by mouth See admin instructions. Prednisone  - take 50 mg by mouth at 13 hours, 7 hours, and 1 hour before contrast media injection (Patient not taking: Reported on 07/27/2024) 3 tablet 0   SYNTHROID  125 MCG tablet Take 100 mcg by mouth daily before breakfast.     triamcinolone  cream (KENALOG ) 0.1 % Apply 1 Application topically 2 (two) times daily as needed (skin irritation.).  No current facility-administered medications on file prior to visit.     ROS: Review of Systems  Constitutional:  Positive for malaise/fatigue.  Respiratory:  Negative for cough, shortness of breath and wheezing.   Cardiovascular:  Negative for chest pain and leg swelling.     BP (!) 147/86 (BP Location: Right Arm)   Pulse 67   Resp 18   Ht 5' 8 (1.727 m)   Wt 217 lb (98.4 kg)   LMP  (LMP Unknown)   SpO2 99%   BMI 32.99 kg/m   Physical Exam Constitutional:      Appearance: Normal appearance.  HENT:     Head: Normocephalic and atraumatic.  Abdominal:   Musculoskeletal:     Cervical back: Normal range of motion.  Skin:     General: Skin is warm and dry.  Neurological:     General: No focal deficit present.     Mental Status: She is alert.      Imaging: CLINICAL DATA:  Status post lobectomy.  Chest pain.   EXAM: CHEST - 2 VIEW   COMPARISON:  Chest radiograph dated 08/25/2024.   FINDINGS: Persistent left basilar opacity. No pneumothorax. Stable cardiac silhouette no acute osseous pathology.   IMPRESSION: Persistent left basilar opacity.     Electronically Signed   By: Vanetta Chou M.D.   On: 08/24/2024 14:36     A/P:  S/P lobectomy of lung and Post-op pain -Overall doing well post surgery -Prescribed Lyrica for post surgical neuropathic pain.  Started at 25 mg BID.  If noticing improvement can increase dosing until therapeutic  -Discussed that she may have symptom of drowsiness from this medication as well and should call our clinic before discontinuing medication -She should continue to use tylenol  for the pain -Abdominal bulge is probable pseudohernia from nerve injury related to surgery.  She should attempt abdominal strengthening exercises.  This usually resolves with time -Follow up as scheduled with [facility]   Manuelita CHRISTELLA Rough, PA-C 08/24/24

## 2024-08-24 NOTE — Patient Instructions (Addendum)
-  Please start lyrica for post surgical pain -follow up as scheduled with our clinic (11/16/2024)

## 2024-09-06 ENCOUNTER — Encounter: Payer: Self-pay | Admitting: Pulmonary Disease

## 2024-09-06 ENCOUNTER — Ambulatory Visit: Admitting: Pulmonary Disease

## 2024-09-06 VITALS — BP 120/74 | HR 62 | Temp 97.6°F | Ht 68.0 in | Wt 218.0 lb

## 2024-09-06 DIAGNOSIS — C3492 Malignant neoplasm of unspecified part of left bronchus or lung: Secondary | ICD-10-CM

## 2024-09-06 DIAGNOSIS — J4520 Mild intermittent asthma, uncomplicated: Secondary | ICD-10-CM | POA: Diagnosis not present

## 2024-09-06 NOTE — Patient Instructions (Signed)
 VISIT SUMMARY:  During your visit, we discussed your sleep disturbances and chest pain. We reviewed your current medications and made some adjustments to better manage your symptoms. We also discussed the possibility of sleep apnea and the next steps for evaluation.  YOUR PLAN:  -CHRONIC POST-THORACOTOMY PAIN SYNDROME: This is a condition where you experience severe pain after having surgery on your chest. We will continue with Lyrica to help manage your pain.  -INSOMNIA AND SUSPECTED SLEEP APNEA: Insomnia is difficulty sleeping, and sleep apnea is a condition where your breathing stops and starts during sleep. We gave you a sleep questionnaire to help assess for sleep apnea and may consider a home sleep study based on the results. If needed, we will refer you to a sleep specialist.  -HISTORY OF THORACIC SURGERY FOR LUNG CANCER: You had surgery on your chest for lung cancer, which has resulted in scar tissue. We have discontinued your Breo medication for now and ask you to monitor for any symptoms like cough, wheezing, or shortness of breath. If these symptoms develop, please resume Breo and notify our clinic.  INSTRUCTIONS:  Please complete the sleep questionnaire we provided. Based on the results, we may arrange a home sleep study. If you experience any new symptoms such as cough, wheezing, or shortness of breath, resume taking Breo and contact our clinic.

## 2024-09-06 NOTE — Progress Notes (Signed)
 Subjective:    Patient ID: Amanda Pierce, female    DOB: 1955/12/02, 68 y.o.   MRN: [id number]  Patient Care Team: Edman Marsa PARAS, DO as PCP - General (Family Medicine) Babara Call, MD as Consulting Physician (Oncology) Tamea Dedra CROME, MD as Consulting Physician (Pulmonary Disease) Verdene Gills, RN as Oncology Nurse Navigator Mincey, Prentice Clack, MD as Consulting Physician (Ophthalmology)  Chief Complaint  Patient presents with   Shortness of Breath    No breathing problems.     BACKGROUND/INTERVAL:Patient is a 68 year old lifelong never smoker with a history of iron  deficiency anemia, who presents for follow-up of a lung nodule/mass noted on the left lower lobe.  This was incidentally found during cardiac CT. She underwent robotic assisted navigational bronchoscopy on 02 February 2024.  This was consistent with non-small cell carcinoma.  Subsequently she underwent robotic assisted left lower lobectomy by Dr. Kerrin on 02 April 2024 consistent with adenocarcinoma stage Ib.  She was last seen on 01 June 2024.  This is a scheduled visit.  HPI Discussed the use of AI scribe software for clinical note transcription with the patient, who gave verbal consent to proceed.  History of Present Illness   Amanda Pierce is a 68 year old female who presents with sleep disturbances and postthoracotomy chest pain.  She experiences significant sleep disturbances, initially managed with gabapentin , which led to excessive sedation and subsequent insomnia. After discontinuing gabapentin , her symptoms worsened. Lyrica was then prescribed, resulting in improved sleep quality. She is unsure about snoring as she lives alone, but an anesthesiologist previously suggested a sleep apnea test. Daytime sleepiness occurs if she does not sleep at night, but she cannot nap during the day.  She describes chest pain as intense and pulsating, requiring her to 'breathe through it.' A chest x-ray was performed two  weeks ago. No shortness of breath is noted unless she overexerts herself outdoors.  Post surgery the patient required Breo Ellipta  and albuterol  for issues with shortness of breath and wheezing.  These have now resolved after she has done a better recovery post operatively.      Review of Systems A 10 point review of systems was performed and it is as noted above otherwise negative.   Patient Active Problem List   Diagnosis Date Noted   Mild persistent asthma without complication 06/10/2024   S/P lobectomy of lung 04/02/2024   Adenocarcinoma of left lung (HCC) 01/20/2024   Sprain of shoulder and upper arm 05/09/2020   Impingement syndrome of shoulder region 05/09/2020   Stomach irritation    Gastric polyp    Special screening for malignant neoplasms, colon    Pain in joint of right shoulder 04/15/2019   Vitamin D  deficiency 01/04/2019   Elevated BP without diagnosis of hypertension 12/07/2018   Osteopenia of spine 09/17/2018   Hiatal hernia 08/28/2018   Obesity (BMI 30.0-34.9) 05/30/2018   Gastroesophageal reflux disease without esophagitis 05/30/2018   Pre-diabetes 05/30/2018   Pure hypercholesterolemia 05/30/2018   Hypothyroidism (acquired) 05/29/2018   Suspected sleep apnea 05/29/2018    Social History   Tobacco Use   Smoking status: Never   Smokeless tobacco: Never  Substance Use Topics   Alcohol use: Not Currently    Allergies  Allergen Reactions   Aspirin Anaphylaxis   Contrast Media [Iodinated Contrast Media] Nausea And Vomiting and Palpitations   Elemental Sulfur Hives and Rash   Ibuprofen Anaphylaxis   Latex Rash    Any latex adhesive, rubber compounds:  examples: ekg  leads,etc.   Penicillins Anaphylaxis    Did it involve swelling of the face/tongue/throat, SOB, or low BP? Yes Did it involve sudden or severe rash/hives, skin peeling, or any reaction on the inside of your mouth or nose? No Did you need to seek medical attention at a hospital or doctor's  office? Yes When did it last happen?      40 years ago/ Patient was in the hospital when happend If all above answers are NO, may proceed with cephalosporin use.   Rubbing Alcohol [Alcohol] Other (See Comments)    Severe skin warmth   Shellfish Allergy Anaphylaxis   Adhesive [Tape]     Able to tolerate Paper Tape   Medical Adhesive Remover     Other reaction(s): Not available   Sulfa Antibiotics     Other reaction(s): Not available    Current Meds  Medication Sig   acetaminophen  (TYLENOL ) 500 MG tablet Take 500-1,000 mg by mouth every 6 (six) hours as needed (pain.).   albuterol  (VENTOLIN  HFA) 108 (90 Base) MCG/ACT inhaler Inhale 2 puffs into the lungs every 6 (six) hours as needed.   Cholecalciferol (VITAMIN D3) 125 MCG (5000 UT) CAPS Take 1 capsule (5,000 Units total) by mouth daily. For 8 weeks, then start Vitamin D3 2,000 units daily (OTC)   EPINEPHrine  (EPIPEN  2-PAK) 0.3 mg/0.3 mL IJ SOAJ injection Inject 0.3 mg into the muscle as needed for anaphylaxis.   fexofenadine (ALLEGRA) 180 MG tablet Take 180 mg by mouth daily as needed for allergies or rhinitis.   fluticasone  (FLONASE ) 50 MCG/ACT nasal spray Place 2 sprays into both nostrils daily.   fluticasone  furoate-vilanterol (BREO ELLIPTA ) 100-25 MCG/ACT AEPB Inhale 1 puff into the lungs daily.   liothyronine (CYTOMEL) 5 MCG tablet Take 5 mcg by mouth daily.   montelukast  (SINGULAIR ) 10 MG tablet Take 1 tablet (10 mg total) by mouth at bedtime. (Patient taking differently: Take 10 mg by mouth at bedtime. PRN)   nystatin cream (MYCOSTATIN) Apply 1 application  topically 2 (two) times daily as needed (skin irritation.).   omeprazole  (PRILOSEC  OTC) 20 MG tablet Take 1 tablet (20 mg total) by mouth daily.   pregabalin (LYRICA) 25 MG capsule Take 1 capsule (25 mg total) by mouth 2 (two) times daily.   SYNTHROID  125 MCG tablet Take 100 mcg by mouth daily before breakfast.   triamcinolone  cream (KENALOG ) 0.1 % Apply 1 Application  topically 2 (two) times daily as needed (skin irritation.).    Immunization History  Administered Date(s) Administered   PFIZER(Purple Top)SARS-COV-2 Vaccination 01/08/2020, 01/29/2020, 10/24/2020   PNEUMOCOCCAL CONJUGATE-20 08/17/2021   Tdap 08/28/2018        Objective:     BP 120/74   Pulse 62   Temp 97.6 F (36.4 C) (Temporal)   Ht 5' 8 (1.727 m)   Wt 218 lb (98.9 kg)   LMP  (LMP Unknown)   SpO2 99%   BMI 33.15 kg/m   SpO2: 99 %  GENERAL: Well-developed, well-nourished woman, no acute distress. HEAD: Normocephalic, atraumatic.  EYES: Pupils equal, round, reactive to light.  No scleral icterus.  MOUTH: Dentition intact, oral mucosa moist.  No thrush. NECK: Supple.  Prominent right thyroid lobe. Trachea midline. No JVD.  No adenopathy. PULMONARY: Good air entry bilaterally.  No adventitious sounds. CARDIOVASCULAR: S1 and S2. Regular rate and rhythm.  No rubs, murmurs or gallops heard. ABDOMEN: Benign MUSCULOSKELETAL: No joint deformity, no clubbing, no edema.  NEUROLOGIC: No overt focal deficit, no gait disturbance, speech  is fluent. SKIN: Intact,warm,dry. PSYCH: Mood and behavior normal.     09/06/2024    1:56 PM  Results of the Epworth flowsheet  Sitting and reading 1  Watching TV 2  Sitting, inactive in a public place (e.g. a theatre or a meeting) 0  As a passenger in a car for an hour without a break 0  Lying down to rest in the afternoon when circumstances permit 0  Sitting and talking to someone 0  Sitting quietly after a lunch without alcohol 0  In a car, while stopped for a few minutes in traffic 0  Total score 3         Assessment & Plan:     ICD-10-CM   1. Adenocarcinoma of left lung (HCC)  C34.92     2. Mild intermittent asthma without complication  J45.20      Discussion:    Chronic post-thoracotomy pain syndrome Severe, pulsating pain post-thoracotomy for lung cancer. Pain is intense and requires breathing through it.  Notes that  this is improving.  Chest x-ray shows scar tissue consistent with previous surgery. No shortness of breath unless overexerted. - Continue Lyrica for pain management. - Has follow-up with thoracic surgery.  Insomnia and suspected sleep apnea Insomnia with difficulty sleeping, exacerbated by previous gabapentin  use. Currently managed with Lyrica, which has improved sleep. Suspected sleep apnea due to anesthesiologist's recommendation and daytime sleepiness when not sleeping at night. Snoring status uncertain as patient reports, Well, I probably do snore, but ain't nobody there to tell me. - Administered sleep questionnaire to assess for sleep apnea: Epworth scale only 3, low likelihood of OSA. - Will consider home sleep study based on questionnaire results: Epworth scale only 3 will hold off on testing. - Will refer to sleep specialist if issues with insomnia persist.   History of thoracic surgery for lung cancer Thoracic surgery for lung cancer with subsequent scar tissue formation. No current respiratory symptoms such as cough or wheezing. Breo was previously used but is now being discontinued to assess need. - Discontinued Breo and instructed to monitor for symptoms such as cough, wheezing, or shortness of breath. - Instructed to resume Breo if symptoms develop and notify the clinic.     Advised if symptoms do not improve or worsen, to please contact office for sooner follow up or seek emergency care.    I spent 30 minutes of dedicated to the care of this patient on the date of this encounter to include pre-visit review of records, face-to-face time with the patient discussing conditions above, post visit ordering of testing, clinical documentation with the electronic health record, making appropriate referrals as documented, and communicating necessary findings to members of the patients care team.     C. Leita Sanders, MD Advanced [facility]ch[facility] [place]    *This note was generated using voice recognition software/Dragon and/or AI transcription program.  Despite best efforts to proofread, errors can occur which can change the meaning. Any transcriptional errors that result from this process are unintentional and may not be fully corrected at the time of dictation.

## 2024-10-20 ENCOUNTER — Other Ambulatory Visit: Payer: Self-pay | Admitting: Pulmonary Disease

## 2024-11-01 ENCOUNTER — Telehealth: Payer: Self-pay | Admitting: *Deleted

## 2024-11-01 NOTE — Telephone Encounter (Signed)
 Caller verified using pt's full name and dob prior to discussing PHI    Patient called triage to review the ct scan allergy dye prep schedule prior to the Ct scan scheduled tomorrow. Patient instructed to take Prednisone  50 mg 1 tablet-13 hours prior to scan, 7 hours prior to scan & 1 hour prior to scan prior to scan and again 1 hour prior to scan.  Pt should take benadryl  25 mg capsule 1 capsule 1 hr prior to procedure. Teach back process performed. She stated that she would just arrive an hour early and take the benadryl  then rather than take the benadryl  and drive an hour here to [facility] mall. She thanked me for calling her back.

## 2024-11-02 ENCOUNTER — Ambulatory Visit
Admission: RE | Admit: 2024-11-02 | Discharge: 2024-11-02 | Disposition: A | Discharge status: Home / Self Care | Source: Ambulatory Visit | Attending: Oncology | Admitting: Oncology

## 2024-11-02 DIAGNOSIS — C3492 Malignant neoplasm of unspecified part of left bronchus or lung: Secondary | ICD-10-CM | POA: Insufficient documentation

## 2024-11-02 MED ORDER — IOHEXOL 300 MG/ML  SOLN
75.0000 mL | Freq: Once | INTRAMUSCULAR | Status: AC | PRN
  Administered 2024-11-02: 75 mL via INTRAVENOUS

## 2024-11-02 MED ORDER — DIPHENHYDRAMINE HCL 50 MG/ML IJ SOLN: 50.0000 mg | Freq: Once | INTRAMUSCULAR | Status: DC

## 2024-11-02 MED ORDER — DIPHENHYDRAMINE HCL 50 MG PO CAPS: 50.0000 mg | ORAL_CAPSULE | Freq: Once | ORAL | Status: DC

## 2024-11-02 MED ORDER — PREDNISONE 50 MG PO TABS: 50.0000 mg | ORAL_TABLET | Freq: Four times a day (QID) | ORAL | Status: DC

## 2024-11-05 ENCOUNTER — Inpatient Hospital Stay: Attending: Oncology | Admitting: Oncology

## 2024-11-05 ENCOUNTER — Encounter: Payer: Self-pay | Admitting: Oncology

## 2024-11-05 VITALS — BP 141/82 | HR 69 | Temp 97.5°F | Resp 18 | Ht 68.0 in | Wt 214.0 lb

## 2024-11-05 DIAGNOSIS — N281 Cyst of kidney, acquired: Secondary | ICD-10-CM | POA: Diagnosis not present

## 2024-11-05 DIAGNOSIS — C3492 Malignant neoplasm of unspecified part of left bronchus or lung: Secondary | ICD-10-CM | POA: Diagnosis not present

## 2024-11-05 DIAGNOSIS — K760 Fatty (change of) liver, not elsewhere classified: Secondary | ICD-10-CM | POA: Diagnosis not present

## 2024-11-05 NOTE — Assessment & Plan Note (Signed)
 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  Tumor size less than 4 cm. Status post left lower lobectomy, negative surgical margin. She is recovering from recent surgery, clinically doing well. I have sent NGS on surgical specimen. If patient has EGFR mutation, consider adjuvant osimertinib.  Otherwise surveillance. recommend surveillance CT chest every 6 months.

## 2024-11-06 DIAGNOSIS — N281 Cyst of kidney, acquired: Secondary | ICD-10-CM | POA: Insufficient documentation

## 2024-11-06 DIAGNOSIS — K760 Fatty (change of) liver, not elsewhere classified: Secondary | ICD-10-CM | POA: Insufficient documentation

## 2024-11-06 NOTE — Progress Notes (Signed)
 " Hematology/Oncology Progress note [phone] [phone]      Patient Care Team: Edman Marsa PARAS, DO as PCP - General (Family Medicine) Babara Call, MD as Consulting Physician (Oncology) Tamea Dedra CROME, MD as Consulting Physician (Pulmonary Disease) Verdene Gills, RN as Oncology Nurse Navigator Mincey, Prentice Clack, MD as Consulting Physician (Ophthalmology)  REFERRING PROVIDER: Edman Marsa PARAS, DO CHIEF COMPLAINTS/REASON FOR VISIT:  Stage IB left lung mucinous adenocarcinoma   ASSESSMENT & PLAN:   Cancer Staging  Adenocarcinoma of left lung [facility]) Staging form: Lung, AJCC V9 - Pathologic stage from 04/20/2024: pT2, pN0, cM0 - Signed by Babara Call, MD on 04/20/2024   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  Tumor size less than 4 cm. Status post left lower lobectomy, negative surgical margin. NGS showed KRAS G12S GOF, TMB 3.7 She is clinically doing well. Continue surveillance.  CT imaging findings were reviewed and discussed with patient.  No recurrence or metastatic disease.  Chronic findings were reviewed with patient. recommend surveillance CT chest every 6 months. Patient did not do blood work and plans to get labs done when she sees PCP  Intermittent sharp pain at previous surgery sites that is likely neuropathic.  Reassurance provided.  Cyst of left kidney Small size and stable.  Fatty liver Recommend healthy diet and exercise   Orders Placed This Encounter  Procedures   CT Chest W Contrast    Standing Status:   Future    Expected Date:   05/05/2025    Expiration Date:   08/03/2025    If indicated for the ordered procedure, I authorize the administration of contrast media per Radiology protocol:   Yes    Does the patient have a contrast media/X-ray dye allergy?:   No    Preferred imaging location?:   [facility]   Follow-up 6 months  We spent sufficient time to discuss  many aspect of care, questions were answered to patient's satisfaction.  All questions were answered. The patient knows to call the clinic with any problems, questions or concerns.  Call Babara, MD, PhD [facility] Hematology Oncology 11/05/2024     INTERVAL HISTORY Amanda Pierce is a 69 y.o. female who has above history reviewed by me today presents to establish care for lung mass evaluation. Patient has a history of iron  deficiency anemia and was previously seen by me.  Oncology History  Adenocarcinoma of left lung (HCC)  01/20/2024 Initial Diagnosis   Adenocarcinoma of left lung (HCC)  S/p biopsy via bronchoscopy. 1. Bronchial Brushing, LLL - NEGATIVE FOR MALIGNANCY. - REACTIVE BRONCHIAL CELLS. 2. Lung, Fine Needle Aspiration, left lower lobe, transbronchial - POSITIVE FOR MALIGNANCY. - NON-SMALL CELL CARCINOMA IS PRESENT.   01/26/2024 Imaging   PET showed  Cavitary spiculated left lower lobe lung nodule has low level abnormal radiotracer uptake. Based on the appearance as well as the low level abnormal uptake, a malignant lesion is in the differential.   No additional lung or areas of nodal abnormal uptake.   Asymmetrically enlarged right thyroid lobe with diffuse abnormal radiotracer uptake. An aggressive thyroid lesion is possible. Please correlate for known history or dedicated thyroid ultrasound as the next step in the workup when able.     04/02/2024 Surgery   Status post  left lobectomy  A. LUNG, LEFT LOWER LOBE, LOBECTOMY: - Mucinous adenocarcinoma of the lung with, 3.2 cm - Visceral pleura is not involved - Resection margin is negative for carcinoma - Negative  for lymphovascular invasion - Five lymph nodes, negative for carcinoma (0/5) - See oncology table B. LYMPH NODE, 9, EXCISION: - Lymph node, negative for carcinoma (0/1) C. LYMPH NODE, 7, EXCISION: - Lymph node, negative for carcinoma (0/1) D. LYMPH NODE, 8, EXCISION: - Lymph node, negative for  carcinoma (0/1) E. LYMPH NODE, 10, EXCISION: - Lymph node, negative for carcinoma (0/1) F. LYMPH NODE, 12, EXCISION: - Lymph node, negative for carcinoma (0/1) G. LYMPH NODE, 12 #2, EXCISION: - Lymph node, negative for carcinoma (0/1) H. LYMPH NODE, 5, EXCISION: - Lymph node, negative for carcinoma (0/1) I. LYMPH NODE, 11, EXCISION: - Lymph node, negative for carcinoma (0/1) J. LYMPH NODE, 12 #3, EXCISION: - Lymph node, negative for carcinoma (0/1) K. LYMPH NODE, 13, EXCISION: - Lymph node, negative for carcinoma (0/1) L. LYMPH NODE, 12 #4, EXCISION: - Lymph node, negative for carcinoma (0/1) M. LYMPH NODE, 11 #2, EXCISION: - Lymph node, negative for carcinoma (0/1)  Synchronous Tumors: Not applicable Total Number of Primary Tumors: 1 Procedure: Lobectomy, lung Specimen Laterality: Left Tumor Focality: Unifocal Tumor Site: Lower lobe Tumor Size: 3.2 cm Histologic Type: Mucinous adenocarcinoma Visceral Pleura Invasion: Not identified Direct Invasion of Adjacent Structures: No adjacent structures present Lymphovascular Invasion: Not identified Margins: All margins negative for invasive carcinoma Closest Margin(s) to Invasive Carcinoma: Bronchial margin Margin(s) Involved by Invasive Carcinoma: Not applicable Margin Status for Non-Invasive Tumor: Not applicable Treatment Effect: No known presurgical therapy Regional Lymph Nodes: Number of Lymph Nodes Involved: 0 Nodal Sites with Tumor: Not applicable Number of Lymph Nodes Examined: 17 Nodal Sites Examined: Levels 5, 7, 8, 10, 11, 12 and 13 Distant Metastasis: Distant Site(s) Involved: Not applicable Pathologic Stage Classification (pTNM, AJCC 8th Edition): pT2a, pN0   04/20/2024 Cancer Staging   Staging form: Lung, AJCC V9 - Pathologic stage from 04/20/2024: pT2, pN0, cM0 - Signed by Babara Call, MD on 04/20/2024 Stage prefix: Initial diagnosis Residual tumor (R): R0   Patient is a never smoker. She experiences intermittent  sharp neuropathic pain localized to the site of prior lung resection. She also has occasional, non-persistent episodes of dyspnea without new or worsening respiratory symptoms.   Review of Systems  Constitutional:  Negative for appetite change, chills, fatigue and fever.  HENT:   Negative for hearing loss and voice change.   Eyes:  Negative for eye problems.  Respiratory:  Negative for chest tightness and cough.   Cardiovascular:  Negative for chest pain.  Gastrointestinal:  Negative for abdominal distention, abdominal pain, blood in stool and constipation.  Endocrine: Negative for hot flashes.  Genitourinary:  Negative for difficulty urinating and frequency.   Musculoskeletal:  Negative for arthralgias.  Skin:  Negative for itching and rash.  Neurological:  Negative for extremity weakness.  Hematological:  Negative for adenopathy.  Psychiatric/Behavioral:  Negative for confusion.     MEDICAL HISTORY:  Past Medical History:  Diagnosis Date   Allergy    Anemia    Asthma    Cancer (HCC) 2025   Non Small Cell Lung Cancer - Left   GERD (gastroesophageal reflux disease)    History of hiatal hernia    Hypothyroidism    Iron  deficiency anemia    Left lower lobe pulmonary nodule 12/2023   Osteopenia    Pneumonia    Pre-diabetes    Pure hypercholesterolemia    Suspected sleep apnea    Thyroid disease    hypothyroid    SURGICAL HISTORY: Past Surgical History:  Procedure Laterality Date  BREAST MASS EXCISION Left 1974   benign   BRONCHOSCOPY, WITH BIOPSY USING ELECTROMAGNETIC NAVIGATION Left 02/02/2024   Procedure: BRONCHOSCOPY, WITH BIOPSY USING ELECTROMAGNETIC NAVIGATION;  Surgeon: Tamea Dedra CROME, MD;  Location: [facility] ORS;  Service: Pulmonary;  Laterality: Left;   COLONOSCOPY WITH PROPOFOL  N/A 10/28/2019   Procedure: COLONOSCOPY WITH PROPOFOL ;  Surgeon: Janalyn Keene NOVAK, MD;  Location: [facility] ENDOSCOPY;  Service: Endoscopy;  Laterality: N/A;   ENDOSCOPIC MUCOSAL RESECTION  N/A 11/22/2019   Procedure: ENDOSCOPIC MUCOSAL RESECTION;  Surgeon: Wilhelmenia Aloha Raddle., MD;  Location: [facility] ENDOSCOPY;  Service: Gastroenterology;  Laterality: N/A;   ESOPHAGOGASTRODUODENOSCOPY (EGD) WITH PROPOFOL  N/A 10/28/2019   Procedure: ESOPHAGOGASTRODUODENOSCOPY (EGD) WITH PROPOFOL ;  Surgeon: Janalyn Keene NOVAK, MD;  Location: [facility] ENDOSCOPY;  Service: Endoscopy;  Laterality: N/A;   ESOPHAGOGASTRODUODENOSCOPY (EGD) WITH PROPOFOL  N/A 11/22/2019   Procedure: ESOPHAGOGASTRODUODENOSCOPY (EGD) WITH PROPOFOL ;  Surgeon: Wilhelmenia Aloha Raddle., MD;  Location: [facility] ENDOSCOPY;  Service: Gastroenterology;  Laterality: N/A;   HEMOSTASIS CLIP PLACEMENT  11/22/2019   Procedure: HEMOSTASIS CLIP PLACEMENT;  Surgeon: Wilhelmenia Aloha Raddle., MD;  Location[facility] ENDOSCOPY;  Service: Gastroenterology;;   HEMOSTASIS CONTROL  11/22/2019   Procedure: HEMOSTASIS CONTROL;  Surgeon: Wilhelmenia Aloha Raddle., MD;  Location: [facility] ENDOSCOPY;  Service: Gastroenterology;;  epi    INTERCOSTAL NERVE BLOCK Left 04/02/2024   Procedure: BLOCK, NERVE, INTERCOSTAL;  Surgeon: Kerrin Elspeth BROCKS, MD;  Location: [facility] OR;  Service: Thoracic;  Laterality: Left;   LOBECTOMY, LUNG, ROBOT-ASSISTED, USING VATS Left 04/02/2024   Procedure: LOBECTOMY, LUNG, ROBOT-ASSISTED, USING VATS;  Surgeon: Kerrin Elspeth BROCKS, MD;  Location: [facility] OR;  Service: Thoracic;  Laterality: Left;  ROBOTIC LEFT LOWER LOBECTOMY   SENTINEL NODE BIOPSY  04/02/2024   Procedure: BIOPSY, LYMPH NODE;  Surgeon: Kerrin Elspeth BROCKS, MD;  Location: [facility] OR;  Service: Thoracic;;   SUBMUCOSAL LIFTING INJECTION  11/22/2019   Procedure: SUBMUCOSAL LIFTING INJECTION;  Surgeon: Wilhelmenia Aloha Raddle., MD;  Location: [facility] ENDOSCOPY;  Service: Gastroenterology;;   THYROIDECTOMY, PARTIAL Left 1982   TUBAL LIGATION     VIDEO BRONCHOSCOPY WITH ENDOBRONCHIAL ULTRASOUND Left 02/02/2024   Procedure: BRONCHOSCOPY, WITH EBUS;  Surgeon: Tamea Dedra CROME, MD;  Location: [facility] ORS;  Service:  Pulmonary;  Laterality: Left;   WISDOM TOOTH EXTRACTION      SOCIAL HISTORY: Social History   Socioeconomic History   Marital status: Widowed    Spouse name: Not on file   Number of children: 4   Years of education: Not on file   Highest education level: Not on file  Occupational History   Occupation: retired  Tobacco Use   Smoking status: Never   Smokeless tobacco: Never  Vaping Use   Vaping status: Never Used  Substance and Sexual Activity   Alcohol use: Not Currently   Drug use: Never   Sexual activity: Not Currently  Other Topics Concern   Not on file  Social History Narrative   Lives alone   Social Drivers of Health   Tobacco Use: Low Risk (11/05/2024)   Patient History    Smoking Tobacco Use: Never    Smokeless Tobacco Use: Never    Passive Exposure: Not on file  Financial Resource Strain: Low Risk  (10/18/2024)   Received from [facility]   Overall Financial Resource Strain (CARDIA)    Difficulty of Paying Living Expenses: Not very hard  Food Insecurity: Food Insecurity Present (10/18/2024)   Received from [facility]   Epic    Within the past 12 months,  you worried that your food would run out before you got the money to buy more.: Sometimes true    Within the past 12 months, the food you bought just didn't last and you didn't have money to get more.: Sometimes true  Transportation Needs: No Transportation Needs (10/18/2024)   Received from [facility] - Transportation    In the past 12 months, has lack of transportation kept you from medical appointments or from getting medications?: No    Lack of Transportation (Non-Medical): No  Physical Activity: Inactive (07/16/2024)   Exercise Vital Sign    Days of Exercise per Week: 0 days    Minutes of Exercise per Session: 0 min  Stress: Stress Concern Present (07/16/2024)   [employer] - Occupational Stress Questionnaire     Feeling of Stress: To some extent  Social Connections: Socially Isolated (07/16/2024)   Social Connection and Isolation Panel    Frequency of Communication with Friends and Family: More than three times a week    Frequency of Social Gatherings with Friends and Family: More than three times a week    Attends Religious Services: Never    Database Administrator or Organizations: No    Attends Banker Meetings: Never    Marital Status: Widowed  Intimate Partner Violence: Not At Risk (07/16/2024)   Epic    Fear of Current or Ex-Partner: No    Emotionally Abused: No    Physically Abused: No    Sexually Abused: No  Depression (PHQ2-9): Low Risk (07/16/2024)   Depression (PHQ2-9)    PHQ-2 Score: 0  Recent Concern: Depression (PHQ2-9) - Medium Risk (06/10/2024)   Depression (PHQ2-9)    PHQ-2 Score: 5  Alcohol Screen: Low Risk (07/16/2024)   Alcohol Screen    Last Alcohol Screening Score (AUDIT): 0  Housing: Low Risk  (10/20/2024)   Received from [facility]   Epic    In the last 12 months, was there a time when you were not able to pay the mortgage or rent on time?: No    In the past 12 months, how many times have you moved where you were living?: 0    At any time in the past 12 months, were you homeless or living in a shelter (including now)?: No  Utilities: Not At Risk (10/18/2024)   Received from [facility]   Epic    In the past 12 months has the electric, gas, oil, or water company threatened to shut off services in your home?: No  Health Literacy: Adequate Health Literacy (07/16/2024)   B1300 Health Literacy    Frequency of need for help with medical instructions: Never    FAMILY HISTORY: Family History  Problem Relation Age of Onset   Cancer Mother        Breast, Paget's removed nipple 60, breast 2001   Colon polyps Mother    Paget's disease of bone Mother    Hypertension Mother    Hyperlipidemia Mother    Heart disease Father     Heart attack Father    Heart disease Maternal Grandfather    Stroke Paternal Grandmother    Colon cancer Neg Hx    Esophageal cancer Neg Hx    Inflammatory bowel disease Neg Hx    Liver disease Neg Hx    Pancreatic cancer Neg Hx    Rectal cancer Neg Hx    Stomach cancer Neg Hx  ALLERGIES:  is allergic to aspirin, contrast media [iodinated contrast media], elemental sulfur, ibuprofen, latex, penicillins, rubbing alcohol [alcohol], shellfish allergy, adhesive [tape], medical adhesive remover, and sulfa antibiotics.  MEDICATIONS:  Current Outpatient Medications  Medication Sig Dispense Refill   acetaminophen  (TYLENOL ) 500 MG tablet Take 500-1,000 mg by mouth every 6 (six) hours as needed (pain.).     albuterol  (VENTOLIN  HFA) 108 (90 Base) MCG/ACT inhaler TAKE 2 PUFFS BY MOUTH EVERY 6 HOURS AS NEEDED 18 each 2   Cholecalciferol (VITAMIN D3) 125 MCG (5000 UT) CAPS Take 1 capsule (5,000 Units total) by mouth daily. For 8 weeks, then start Vitamin D3 2,000 units daily (OTC) 90 capsule    EPINEPHrine  (EPIPEN  2-PAK) 0.3 mg/0.3 mL IJ SOAJ injection Inject 0.3 mg into the muscle as needed for anaphylaxis. 1 each 1   fexofenadine (ALLEGRA) 180 MG tablet Take 180 mg by mouth daily as needed for allergies or rhinitis.     fluticasone  (FLONASE ) 50 MCG/ACT nasal spray Place 2 sprays into both nostrils daily. 48 mL 3   fluticasone  furoate-vilanterol (BREO ELLIPTA ) 100-25 MCG/ACT AEPB Inhale 1 puff into the lungs daily. 60 each 5   liothyronine (CYTOMEL) 5 MCG tablet Take 5 mcg by mouth daily.     montelukast  (SINGULAIR ) 10 MG tablet Take 1 tablet (10 mg total) by mouth at bedtime. (Patient taking differently: Take 10 mg by mouth at bedtime. PRN) 90 tablet 3   nystatin cream (MYCOSTATIN) Apply 1 application  topically 2 (two) times daily as needed (skin irritation.).     omeprazole  (PRILOSEC  OTC) 20 MG tablet Take 1 tablet (20 mg total) by mouth daily. 30 tablet 6   pregabalin  (LYRICA ) 25 MG capsule  Take 1 capsule (25 mg total) by mouth 2 (two) times daily. 180 capsule 0   SYNTHROID  125 MCG tablet Take 100 mcg by mouth daily before breakfast.     triamcinolone  cream (KENALOG ) 0.1 % Apply 1 Application topically 2 (two) times daily as needed (skin irritation.).     predniSONE  (DELTASONE ) 50 MG tablet Take 1 tablet (50 mg total) by mouth See admin instructions. Prednisone  - take 50 mg by mouth at 13 hours, 7 hours, and 1 hour before contrast media injection (Patient not taking: Reported on 11/05/2024) 3 tablet 0   No current facility-administered medications for this visit.     PHYSICAL EXAMINATION: ECOG PERFORMANCE STATUS: 0 - Asymptomatic   Vitals:   11/05/24 1214  BP: (!) 141/82  Pulse: 69  Resp: 18  Temp: (!) 97.5 F (36.4 C)  SpO2: 100%   Filed Weights   11/05/24 1214  Weight: 214 lb (97.1 kg)    Physical Exam Constitutional:      General: She is not in acute distress. HENT:     Head: Normocephalic and atraumatic.  Eyes:     General: No scleral icterus. Cardiovascular:     Rate and Rhythm: Normal rate and regular rhythm.     Heart sounds: Normal heart sounds.  Pulmonary:     Effort: Pulmonary effort is normal. No respiratory distress.     Breath sounds: No wheezing.     Comments: Absent breath sound left lower lobe Abdominal:     General: There is no distension.     Palpations: Abdomen is soft.  Musculoskeletal:        General: Normal range of motion.     Cervical back: Normal range of motion and neck supple.  Skin:    Findings: No erythema or rash.  Neurological:     Mental Status: She is alert and oriented to person, place, and time. Mental status is at baseline.  Psychiatric:        Mood and Affect: Mood normal.      LABORATORY DATA:  I have reviewed the data as listed Lab Results  Component Value Date   WBC 5.6 04/04/2024   HGB 11.4 (L) 04/04/2024   HCT 35.8 (L) 04/04/2024   MCV 88.2 04/04/2024   PLT 237 04/04/2024   Recent Labs     01/19/24 1454 03/31/24 1200 04/03/24 0450 04/04/24 0510  NA 136 139 135 135  K 4.0 4.4 4.2 4.1  CL 104 105 103 105  CO2 24 26 24 25   GLUCOSE 85 109* 127* 101*  BUN 16 12 14 10   CREATININE 0.83 0.84 0.96 0.87  CALCIUM 8.7* 9.0 8.2* 8.3*  GFRNONAA >60 >60 >60 >60  PROT 7.7 7.1  --  6.1*  ALBUMIN  3.9 3.4*  --  2.9*  AST 22 22  --  26  ALT 18 17  --  14  ALKPHOS 72 61  --  45  BILITOT 0.5 0.8  --  0.8   Iron /TIBC/Ferritin/ %Sat    Component Value Date/Time   IRON  55 12/10/2021 1412   TIBC 337 12/10/2021 1412   FERRITIN 25 12/10/2021 1412   IRONPCTSAT 16 12/10/2021 1412   IRONPCTSAT 9 (L) 10/11/2019 0940     CT Chest W Contrast Result Date: 11/02/2024 CLINICAL DATA:  Non-small cell lung cancer (NSCLC), non-metastatic, assess treatment response. * Tracking Code: BO * EXAM: CT CHEST WITH CONTRAST TECHNIQUE: Multidetector CT imaging of the chest was performed during intravenous contrast administration. RADIATION DOSE REDUCTION: This exam was performed according to the departmental dose-optimization program which includes automated exposure control, adjustment of the mA and/or kV according to patient size and/or use of iterative reconstruction technique. CONTRAST:  75mL OMNIPAQUE  IOHEXOL  300 MG/ML  SOLN COMPARISON:  CT scan chest from 01/30/2024. FINDINGS: Cardiovascular: Normal cardiac size. No pericardial effusion. No aortic aneurysm. There are mild peripheral atherosclerotic vascular calcifications of thoracic aorta and its major branches. Mediastinum/Nodes: Surgically absent versus atrophic left thyroid lobe. Unremarkable right thyroid lobe. No focal solid or cystic mass. However, note is made of increasing sub 5 mm faint nodular opacities in the anterosuperior mediastinum, nonspecific but commonly seen with reactive hyperplasia. The esophagus is nondistended precluding optimal assessment. No axillary, mediastinal or hilar lymphadenopathy by size criteria. Lungs/Pleura: The central  tracheo-bronchial tree is patent. Since the prior study, patient underwent a left lung lower lobectomy. No mass or consolidation. No pleural effusion or pneumothorax. There are several sub 4 mm noncalcified nodules in bilateral lungs (marked with electronic arrow sign on series 2004). These are unchanged since the prior study. No new suspicious lung nodule. Upper Abdomen: There is mild diffuse hepatic steatosis. There is a tiny sliding hiatal hernia. There is a partially seen hypoattenuating area in the right kidney upper pole, which is incompletely characterized on the current exam but favored to represent a cyst. There is a 1 cm sized cyst in the left kidney upper pole as well. Remaining visualized upper abdominal viscera within normal limits. Musculoskeletal: The visualized soft tissues of the chest wall are grossly unremarkable. No suspicious osseous lesions. There are mild to moderate multilevel degenerative changes in the visualized spine. IMPRESSION: 1. Since the prior study, patient underwent left lung lower lobectomy. No residual or recurrent tumor identified. No metastatic disease identified within the chest. 2. Multiple  other nonacute observations, as described above. Aortic Atherosclerosis (ICD10-I70.0). Electronically Signed   By: Ree Molt M.D.   On: 11/02/2024 11:36     "

## 2024-11-06 NOTE — Assessment & Plan Note (Signed)
 Small size and stable.

## 2024-11-06 NOTE — Assessment & Plan Note (Signed)
 Recommend healthy diet and exercise.

## 2024-11-08 ENCOUNTER — Telehealth: Payer: Self-pay | Admitting: Oncology

## 2024-11-08 NOTE — Telephone Encounter (Signed)
 Called pt to sched CT - pt requested to r/s MD appt - changed appt w/pt, but then she said she wanted to r/s MD appt again and wanted me to call back later today to r/s MD appt and sched CT - [facility]

## 2024-11-11 ENCOUNTER — Telehealth: Payer: Self-pay | Admitting: Oncology

## 2024-11-11 ENCOUNTER — Other Ambulatory Visit: Payer: Self-pay | Admitting: Oncology

## 2024-11-11 MED ORDER — PREDNISONE 50 MG PO TABS: 50.0000 mg | ORAL_TABLET | ORAL | 0 refills | Status: AC

## 2024-11-11 NOTE — Telephone Encounter (Signed)
 Called pt to sched CT - pt confirmed date of MD follow up - pt confirmed CT date/time/location - pt asked for appt reminder via mail - [facility]

## 2024-11-16 ENCOUNTER — Ambulatory Visit: Admitting: Thoracic Surgery (Cardiothoracic Vascular Surgery)

## 2024-11-16 ENCOUNTER — Ambulatory Visit
Attending: Thoracic Surgery (Cardiothoracic Vascular Surgery) | Admitting: Thoracic Surgery (Cardiothoracic Vascular Surgery)

## 2024-11-16 ENCOUNTER — Encounter: Payer: Self-pay | Admitting: Thoracic Surgery (Cardiothoracic Vascular Surgery)

## 2024-11-16 VITALS — BP 160/85 | HR 86 | Resp 18 | Ht 68.0 in | Wt 210.0 lb

## 2024-11-16 DIAGNOSIS — C3492 Malignant neoplasm of unspecified part of left bronchus or lung: Secondary | ICD-10-CM | POA: Diagnosis not present

## 2024-11-16 DIAGNOSIS — Z09 Encounter for follow-up examination after completed treatment for conditions other than malignant neoplasm: Secondary | ICD-10-CM

## 2024-11-16 NOTE — Progress Notes (Signed)
 "  [address] [place] [address]             [phone]    HPI: Amanda Pierce returns for a scheduled follow-up visit after prior lobectomy for non-small cell carcinoma of the lung.  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  She is a lifelong non-smoker.  Found to have a lung nodule and a CT for coronary calcium screening.  Robotic bronchoscopy demonstrated non-small cell carcinoma.  She underwent robotic assisted left lower lobectomy in June 2025.  Final pathology was 1B (T2a, N0).  She is doing well.  Still has some incisional discomfort but is not taking any medication for that.  Has not had any respiratory issues.  Overall feels well.  Past Medical History:  Diagnosis Date   Allergy    Anemia    Asthma    Cancer (HCC) 2025   Non Small Cell Lung Cancer - Left   GERD (gastroesophageal reflux disease)    History of hiatal hernia    Hypothyroidism    Iron  deficiency anemia    Left lower lobe pulmonary nodule 12/2023   Osteopenia    Pneumonia    Pre-diabetes    Pure hypercholesterolemia    Suspected sleep apnea    Thyroid disease    hypothyroid    Current Outpatient Medications  Medication Sig Dispense Refill   acetaminophen  (TYLENOL ) 500 MG tablet Take 500-1,000 mg by mouth every 6 (six) hours as needed (pain.).     albuterol  (VENTOLIN  HFA) 108 (90 Base) MCG/ACT inhaler TAKE 2 PUFFS BY MOUTH EVERY 6 HOURS AS NEEDED 18 each 2   Cholecalciferol (VITAMIN D3) 125 MCG (5000 UT) CAPS Take 1 capsule (5,000 Units total) by mouth daily. For 8 weeks, then start Vitamin D3 2,000 units daily (OTC) 90 capsule    EPINEPHrine  (EPIPEN  2-PAK) 0.3 mg/0.3 mL IJ SOAJ injection Inject 0.3 mg into the muscle as needed for anaphylaxis. 1 each 1   fexofenadine (ALLEGRA) 180 MG tablet Take 180 mg  by mouth daily as needed for allergies or rhinitis.     fluticasone  (FLONASE ) 50 MCG/ACT nasal spray Place 2 sprays into both nostrils daily. 48 mL 3   fluticasone  furoate-vilanterol (BREO ELLIPTA ) 100-25 MCG/ACT AEPB Inhale 1 puff into the lungs daily. 60 each 5   liothyronine (CYTOMEL) 5 MCG tablet Take 5 mcg by mouth daily.     montelukast  (SINGULAIR ) 10 MG tablet Take 1 tablet (10 mg total) by mouth at bedtime. (Patient taking differently: Take 10 mg by mouth at bedtime. PRN) 90 tablet 3   nystatin cream (MYCOSTATIN) Apply 1 application  topically 2 (two) times daily as needed (skin irritation.).     omeprazole  (PRILOSEC  OTC) 20 MG tablet Take 1 tablet (20 mg total) by mouth daily. 30 tablet 6   [START ON 03/28/2025] predniSONE  (DELTASONE ) 50 MG tablet Take 1 tablet (50 mg total) by mouth See admin instructions. Prednisone  - take 50 mg by mouth at 13 hours, 7 hours, and 1 hour before contrast media injection 3 tablet 0   pregabalin  (LYRICA ) 25 MG capsule Take 1 capsule (25 mg total) by mouth 2 (two) times daily. 180 capsule 0   SYNTHROID  125 MCG tablet Take 100 mcg by mouth daily before breakfast.     triamcinolone  cream (KENALOG ) 0.1 % Apply 1 Application  topically 2 (two) times daily as needed (skin irritation.).     No current facility-administered medications for this visit.    Physical Exam BP (!) 160/85   Pulse 86   Resp 18   Ht 5' 8 (1.727 m)   Wt 210 lb (95.3 kg)   LMP  (LMP Unknown)   SpO2 98% Comment: RA  BMI 31.93 kg/m  Obese 68 year old woman in no acute distress Alert and oriented x 3 with no focal deficits Lungs diminished at left base but otherwise clear Incisions well-healed No cervical issues with equal adenopathy Cardiac regular rate and rhythm  Diagnostic Tests: CT CHEST WITH CONTRAST   TECHNIQUE: Multidetector CT imaging of the chest was performed during intravenous contrast administration.   RADIATION DOSE REDUCTION: This exam was performed according to  the departmental dose-optimization program which includes automated exposure control, adjustment of the mA and/or kV according to patient size and/or use of iterative reconstruction technique.   CONTRAST:  75mL OMNIPAQUE  IOHEXOL  300 MG/ML  SOLN   COMPARISON:  CT scan chest from 01/30/2024.   FINDINGS: Cardiovascular: Normal cardiac size. No pericardial effusion. No aortic aneurysm. There are mild peripheral atherosclerotic vascular calcifications of thoracic aorta and its major branches.   Mediastinum/Nodes: Surgically absent versus atrophic left thyroid lobe. Unremarkable right thyroid lobe. No focal solid or cystic mass. However, note is made of increasing sub 5 mm faint nodular opacities in the anterosuperior mediastinum, nonspecific but commonly seen with reactive hyperplasia. The esophagus is nondistended precluding optimal assessment. No axillary, mediastinal or hilar lymphadenopathy by size criteria.   Lungs/Pleura: The central tracheo-bronchial tree is patent. Since the prior study, patient underwent a left lung lower lobectomy. No mass or consolidation. No pleural effusion or pneumothorax. There are several sub 4 mm noncalcified nodules in bilateral lungs (marked with electronic arrow sign on series 2004). These are unchanged since the prior study. No new suspicious lung nodule.   Upper Abdomen: There is mild diffuse hepatic steatosis. There is a tiny sliding hiatal hernia. There is a partially seen hypoattenuating area in the right kidney upper pole, which is incompletely characterized on the current exam but favored to represent a cyst. There is a 1 cm sized cyst in the left kidney upper pole as well. Remaining visualized upper abdominal viscera within normal limits.   Musculoskeletal: The visualized soft tissues of the chest wall are grossly unremarkable. No suspicious osseous lesions. There are mild to moderate multilevel degenerative changes in the visualized  spine.   IMPRESSION: 1. Since the prior study, patient underwent left lung lower lobectomy. No residual or recurrent tumor identified. No metastatic disease identified within the chest. 2. Multiple other nonacute observations, as described above.   Aortic Atherosclerosis (ICD10-I70.0).     Electronically Signed   By: Ree Molt M.D.   On: 11/02/2024 11:36 I personally reviewed the CT images.  Status post left lower lobectomy.  No evidence of recurrent disease.  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  She is a lifelong non-smoker.  Stage Ib adenocarcinoma, status post left lower lobectomy-now over 6 months out from surgery with no evidence of recurrent disease.  Will have another CT in 6 months.  Still has some discomfort associated with her incisions.  Has improved significantly but not completely resolved.   Plan: Return in 6 months after CT chest  Elspeth BROCKS  Kerrin, MD [facility] [phone]     "

## 2024-11-21 ENCOUNTER — Other Ambulatory Visit: Payer: Self-pay | Admitting: Thoracic Surgery (Cardiothoracic Vascular Surgery)

## 2024-11-22 ENCOUNTER — Ambulatory Visit: Admitting: Urology

## 2024-11-30 ENCOUNTER — Ambulatory Visit (INDEPENDENT_AMBULATORY_CARE_PROVIDER_SITE_OTHER): Admitting: Urology

## 2024-11-30 ENCOUNTER — Encounter: Payer: Self-pay | Admitting: Urology

## 2024-11-30 VITALS — BP 136/91 | HR 105 | Ht 68.5 in | Wt 212.0 lb

## 2024-11-30 DIAGNOSIS — R8281 Pyuria: Secondary | ICD-10-CM | POA: Diagnosis not present

## 2024-11-30 DIAGNOSIS — R300 Dysuria: Secondary | ICD-10-CM | POA: Diagnosis not present

## 2024-11-30 LAB — URINALYSIS, COMPLETE
Bilirubin, UA: NEGATIVE
Glucose, UA: NEGATIVE
Ketones, UA: NEGATIVE
Leukocytes,UA: NEGATIVE
Nitrite, UA: NEGATIVE
Protein,UA: NEGATIVE
Specific Gravity, UA: 1.03 (ref 1.005–1.030)
Urobilinogen, Ur: 0.2 mg/dL (ref 0.2–1.0)
pH, UA: 5.5 (ref 5.0–7.5)

## 2024-11-30 LAB — MICROSCOPIC EXAMINATION: Bacteria, UA: NONE SEEN

## 2024-12-09 ENCOUNTER — Other Ambulatory Visit

## 2024-12-16 ENCOUNTER — Encounter: Admitting: Family Medicine

## 2024-12-20 ENCOUNTER — Ambulatory Visit: Admit: 2024-12-20

## 2024-12-20 SURGERY — COLONOSCOPY
Anesthesia: General

## 2024-12-23 ENCOUNTER — Ambulatory Visit: Admitting: Pulmonary Disease

## 2025-03-09 ENCOUNTER — Ambulatory Visit: Admitting: Urology

## 2025-05-16 ENCOUNTER — Inpatient Hospital Stay: Admitting: Oncology

## 2025-05-18 ENCOUNTER — Other Ambulatory Visit

## 2025-05-25 ENCOUNTER — Inpatient Hospital Stay: Admitting: Oncology

## 2025-05-31 ENCOUNTER — Ambulatory Visit: Admitting: Thoracic Surgery (Cardiothoracic Vascular Surgery)

## 2025-07-29 ENCOUNTER — Ambulatory Visit
